# Patient Record
Sex: Female | Born: 1980 | Race: White | Hispanic: No | Marital: Married | Smoking: Former smoker
Health system: Southern US, Community
[De-identification: ages and names within clinical notes are randomized; demographics above are authoritative.]

## PROBLEM LIST (undated history)

## (undated) DIAGNOSIS — Z87440 Personal history of urinary (tract) infections: Secondary | ICD-10-CM

## (undated) DIAGNOSIS — F419 Anxiety disorder, unspecified: Secondary | ICD-10-CM

## (undated) DIAGNOSIS — R51 Headache: Secondary | ICD-10-CM

## (undated) DIAGNOSIS — F329 Major depressive disorder, single episode, unspecified: Secondary | ICD-10-CM

## (undated) DIAGNOSIS — F32A Depression, unspecified: Secondary | ICD-10-CM

## (undated) DIAGNOSIS — K589 Irritable bowel syndrome without diarrhea: Secondary | ICD-10-CM

## (undated) DIAGNOSIS — N200 Calculus of kidney: Secondary | ICD-10-CM

## (undated) HISTORY — PX: SHOULDER SURGERY: SHX246

## (undated) HISTORY — DX: Irritable bowel syndrome, unspecified: K58.9

## (undated) HISTORY — DX: Depression, unspecified: F32.A

## (undated) HISTORY — DX: Major depressive disorder, single episode, unspecified: F32.9

## (undated) HISTORY — DX: Anxiety disorder, unspecified: F41.9

## (undated) HISTORY — PX: NO PAST SURGERIES: SHX2092

---
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
## 2005-02-18 ENCOUNTER — Inpatient Hospital Stay (HOSPITAL_COMMUNITY): Admission: AD | Admit: 2005-02-18 | Discharge: 2005-02-18 | Payer: Self-pay | Admitting: Obstetrics

## 2005-02-26 ENCOUNTER — Ambulatory Visit (HOSPITAL_COMMUNITY): Admission: RE | Admit: 2005-02-26 | Discharge: 2005-02-26 | Payer: Self-pay | Admitting: Obstetrics

## 2005-04-22 ENCOUNTER — Inpatient Hospital Stay (HOSPITAL_COMMUNITY): Admission: AD | Admit: 2005-04-22 | Discharge: 2005-04-22 | Payer: Self-pay | Admitting: Obstetrics

## 2005-06-14 ENCOUNTER — Inpatient Hospital Stay (HOSPITAL_COMMUNITY): Admission: AD | Admit: 2005-06-14 | Discharge: 2005-06-14 | Payer: Self-pay | Admitting: Obstetrics & Gynecology

## 2005-06-29 ENCOUNTER — Inpatient Hospital Stay (HOSPITAL_COMMUNITY): Admission: AD | Admit: 2005-06-29 | Discharge: 2005-07-02 | Payer: Self-pay | Admitting: Obstetrics & Gynecology

## 2007-11-27 ENCOUNTER — Ambulatory Visit: Payer: Self-pay | Admitting: Family Medicine

## 2008-02-09 ENCOUNTER — Ambulatory Visit: Payer: Self-pay | Admitting: Occupational Medicine

## 2008-02-09 DIAGNOSIS — R109 Unspecified abdominal pain: Secondary | ICD-10-CM | POA: Insufficient documentation

## 2008-02-09 DIAGNOSIS — R110 Nausea: Secondary | ICD-10-CM | POA: Insufficient documentation

## 2008-02-16 ENCOUNTER — Encounter: Payer: Self-pay | Admitting: Family

## 2008-02-16 ENCOUNTER — Ambulatory Visit: Payer: Self-pay | Admitting: Family

## 2008-02-17 ENCOUNTER — Encounter: Payer: Self-pay | Admitting: Family

## 2008-02-17 LAB — CONVERTED CEMR LAB
Antibody Screen: NEGATIVE
Basophils Absolute: 0.1 10*3/uL (ref 0.0–0.1)
Basophils Relative: 1 % (ref 0–1)
Eosinophils Absolute: 0.3 10*3/uL (ref 0.0–0.7)
Eosinophils Relative: 3 % (ref 0–5)
HCT: 38.9 % (ref 36.0–46.0)
Hemoglobin: 13.2 g/dL (ref 12.0–15.0)
Hepatitis B Surface Ag: NEGATIVE
Lymphocytes Relative: 24 % (ref 12–46)
Lymphs Abs: 2.4 10*3/uL (ref 0.7–4.0)
MCHC: 33.9 g/dL (ref 30.0–36.0)
MCV: 91.7 fL (ref 78.0–100.0)
Monocytes Absolute: 0.7 10*3/uL (ref 0.1–1.0)
Monocytes Relative: 7 % (ref 3–12)
Neutro Abs: 6.4 10*3/uL (ref 1.7–7.7)
Neutrophils Relative %: 65 % (ref 43–77)
Platelets: 194 10*3/uL (ref 150–400)
RBC: 4.24 M/uL (ref 3.87–5.11)
RDW: 12.9 % (ref 11.5–15.5)
Rh Type: POSITIVE
Rubella: 330.2 intl units/mL — ABNORMAL HIGH
WBC: 9.9 10*3/uL (ref 4.0–10.5)

## 2008-03-19 ENCOUNTER — Ambulatory Visit: Payer: Self-pay | Admitting: Family

## 2008-04-23 ENCOUNTER — Ambulatory Visit: Payer: Self-pay | Admitting: Physician Assistant

## 2008-05-06 ENCOUNTER — Ambulatory Visit (HOSPITAL_COMMUNITY): Admission: RE | Admit: 2008-05-06 | Discharge: 2008-05-06 | Payer: Self-pay | Admitting: Obstetrics & Gynecology

## 2008-05-21 ENCOUNTER — Ambulatory Visit: Payer: Self-pay | Admitting: Family

## 2008-06-04 ENCOUNTER — Ambulatory Visit: Payer: Self-pay | Admitting: Obstetrics and Gynecology

## 2008-07-02 ENCOUNTER — Ambulatory Visit: Payer: Self-pay | Admitting: Family

## 2008-07-02 LAB — CONVERTED CEMR LAB
HCT: 37.2 % (ref 36.0–46.0)
Hemoglobin: 12.1 g/dL (ref 12.0–15.0)
MCHC: 32.5 g/dL (ref 30.0–36.0)
MCV: 95.9 fL (ref 78.0–100.0)
Platelets: 193 10*3/uL (ref 150–400)
RBC: 3.88 M/uL (ref 3.87–5.11)
RDW: 13 % (ref 11.5–15.5)
WBC: 8.6 10*3/uL (ref 4.0–10.5)

## 2008-07-16 ENCOUNTER — Ambulatory Visit: Payer: Self-pay | Admitting: Obstetrics and Gynecology

## 2008-07-19 ENCOUNTER — Encounter: Payer: Self-pay | Admitting: Obstetrics and Gynecology

## 2008-07-19 LAB — CONVERTED CEMR LAB
Collection Interval-CRCL: 24 hr
Creatinine 24 HR UR: 1750 mg/24hr (ref 700–1800)
Creatinine Clearance: 193 mL/min — ABNORMAL HIGH (ref 75–115)
Creatinine, Urine: 109.4 mg/dL
Protein, Ur: 96 mg/24hr (ref 50–100)

## 2008-07-21 ENCOUNTER — Ambulatory Visit: Payer: Self-pay | Admitting: Obstetrics & Gynecology

## 2008-07-26 ENCOUNTER — Ambulatory Visit: Payer: Self-pay | Admitting: Obstetrics and Gynecology

## 2008-08-13 ENCOUNTER — Ambulatory Visit: Payer: Self-pay | Admitting: Family

## 2008-08-20 ENCOUNTER — Ambulatory Visit: Payer: Self-pay | Admitting: Physician Assistant

## 2008-08-21 ENCOUNTER — Encounter: Payer: Self-pay | Admitting: Physician Assistant

## 2008-08-21 LAB — CONVERTED CEMR LAB
Chlamydia, DNA Probe: NEGATIVE
GC Probe Amp, Genital: NEGATIVE

## 2008-08-27 ENCOUNTER — Ambulatory Visit: Payer: Self-pay | Admitting: Family

## 2008-08-28 ENCOUNTER — Ambulatory Visit: Payer: Self-pay | Admitting: Advanced Practice Midwife

## 2008-08-28 ENCOUNTER — Inpatient Hospital Stay (HOSPITAL_COMMUNITY): Admission: AD | Admit: 2008-08-28 | Discharge: 2008-08-30 | Payer: Self-pay | Admitting: Obstetrics & Gynecology

## 2008-10-11 ENCOUNTER — Ambulatory Visit: Payer: Self-pay | Admitting: Physician Assistant

## 2008-10-20 ENCOUNTER — Ambulatory Visit: Payer: Self-pay | Admitting: Obstetrics & Gynecology

## 2009-04-02 ENCOUNTER — Ambulatory Visit: Payer: Self-pay | Admitting: Family Medicine

## 2009-04-02 DIAGNOSIS — J0100 Acute maxillary sinusitis, unspecified: Secondary | ICD-10-CM | POA: Insufficient documentation

## 2009-12-23 ENCOUNTER — Ambulatory Visit: Payer: Self-pay | Admitting: Obstetrics and Gynecology

## 2010-01-03 ENCOUNTER — Ambulatory Visit: Payer: Self-pay | Admitting: Family Medicine

## 2010-01-03 DIAGNOSIS — J020 Streptococcal pharyngitis: Secondary | ICD-10-CM | POA: Insufficient documentation

## 2010-01-03 LAB — CONVERTED CEMR LAB: Rapid Strep: POSITIVE

## 2010-04-11 NOTE — Assessment & Plan Note (Signed)
Summary: HA,sorethroat, Ear pain, Fever x 1 dy rm 4   Vital Signs:  Patient Profile:   30 Years Old Female CC:      Cold & URI symptoms Height:     68 inches Weight:      123 pounds O2 Sat:      100 % O2 treatment:    Room Air Temp:     98.4 degrees F oral Pulse rate:   102 / minute Pulse rhythm:   regular Resp:     16 per minute BP sitting:   133 / 88  (right arm) Cuff size:   regular  Vitals Entered By: Areta Haber CMA (January 03, 2010 7:13 PM)                  Current Allergies: No known allergies History of Present Illness Chief Complaint: Cold & URI symptoms History of Present Illness:  Subjective: Patient complains of sore throat for 1 day. No cough No pleuritic pain No wheezing No nasal congestion No post-nasal drainage No sinus pain/pressure No itchy/red eyes ? earache No hemoptysis No SOB ? fever/chills No nausea No vomiting No abdominal pain No diarrhea + fatigue + myalgias No headache Used OTC meds without relief   Current Problems: PHARYNGITIS, STREPTOCOCCAL (ICD-034.0) ACUTE MAXILLARY SINUSITIS (ICD-461.0) FLANK PAIN, LEFT (ICD-789.09) NAUSEA (ICD-787.02)   Current Meds [facility] IBUPROFEN 200 MG TABS (IBUPROFEN)  IMPLANON 68 MG IMPL (ETONOGESTREL)  THERAFLU COLD & SORE THROAT 20-10-325 MG PACK (PHENIRAMINE-PE-APAP) as directed LOESTRIN 24 FE 1-20 MG-MCG TABS (NORETHIN ACE-ETH ESTRAD-FE) 1 tab by mouth once daily PENICILLIN V POTASSIUM 250 MG/5ML SOLR (PENICILLIN V POTASSIUM) 10cc by mouth q8hr  REVIEW OF SYSTEMS Constitutional Symptoms       Complains of fever.     Denies chills, night sweats, weight loss, weight gain, and fatigue.  Eyes       Denies change in vision, eye pain, eye discharge, glasses, contact lenses, and eye surgery. Ear/Nose/Throat/Mouth       Complains of ear pain and sore throat.      Denies hearing loss/aids, change in hearing, ear discharge, dizziness, frequent runny nose, frequent nose bleeds, sinus  problems, hoarseness, and tooth pain or bleeding.      Comments: Both x 1 dy Respiratory       Denies dry cough, productive cough, wheezing, shortness of breath, asthma, bronchitis, and emphysema/COPD.  Cardiovascular       Denies murmurs, chest pain, and tires easily with exhertion.    Gastrointestinal       Denies stomach pain, nausea/vomiting, diarrhea, constipation, blood in bowel movements, and indigestion. Genitourniary       Denies painful urination, kidney stones, and loss of urinary control. Neurological       Complains of headaches.      Denies paralysis, seizures, and fainting/blackouts. Musculoskeletal       Denies muscle pain, joint pain, joint stiffness, decreased range of motion, redness, swelling, muscle weakness, and gout.  Skin       Denies bruising, unusual mles/lumps or sores, and hair/skin or nail changes.  Psych       Denies mood changes, temper/anger issues, anxiety/stress, speech problems, depression, and sleep problems.  Past History:  Past Medical History: Last updated: 11/27/2007 NO MAJOR MEDICAL ILLNESSES OR DISEASES  Past Surgical History: Last updated: 11/27/2007 NONE  Family History: Last updated: 02/09/2008 Mother age 37 healthy Father age 25 healthy sister age 25 healthy  Social History: Last updated: 02/09/2008 Tobacco use times  12 yrs recently stopped No ETOH use Rec drugs marijuana   Objective:  Appearance:  Patient appears healthy, stated age, and in no acute distress  Eyes:  Pupils are equal, round, and reactive to light and accomdation.  Extraocular movement is intact.  Conjunctivae are not inflamed.  Ears:  Canals normal.  Tympanic membranes normal.   Nose:  Normal septum.  Normal turbinates, mildly congested.  No sinus tenderness present.  Pharynx:  Erythematous Neck:  Supple.   Tender shotty anterior  nodes are palpated bilaterally.  Lungs:  Clear to auscultation.  Breath sounds are equal.  Heart:  Regular rate and rhythm  without murmurs, rubs, or gallops.  Abdomen:  Nontender without masses or hepatosplenomegaly.  Bowel sounds are present.  No CVA or flank tenderness.  Skin:  No rash Rapid strep test positive Assessment New Problems: PHARYNGITIS, STREPTOCOCCAL (ICD-034.0)   Plan New Medications/Changes: PENICILLIN V POTASSIUM 250 MG/5ML SOLR (PENICILLIN V POTASSIUM) 10cc by mouth q8hr  #300cc x 0, 01/03/2010, Donna Christen MD  New Orders: Est. Patient Level III [10272] Rapid Strep [53664] Planning Comments:   Begin penicillin for 10 days.  Ibuprofen 200mg , 4 tabs every 8 hours with food.  Saline gargles. Follow-up with PCP if not improving.   The patient and/or caregiver has been counseled thoroughly with regard to medications prescribed including dosage, schedule, interactions, rationale for use, and possible side effects and they verbalize understanding.  Diagnoses and expected course of recovery discussed and will return if not improved as expected or if the condition worsens. Patient and/or caregiver verbalized understanding.  Prescriptions: PENICILLIN V POTASSIUM 250 MG/5ML SOLR (PENICILLIN V POTASSIUM) 10cc by mouth q8hr  #300cc x 0   Entered and Authorized by:   Donna Christen MD   Signed by:   Donna Christen MD on 01/03/2010   Method used:   Print then Give to Patient   RxID:   [id number]   Orders Added: 1)  Est. Patient Level III [43329] 2)  Rapid Strep [51884]    Laboratory Results  Date/Time Received: January 03, 2010 7:17 PM  Date/Time Reported: January 03, 2010 7:17 PM   Other Tests  Rapid Strep: positive  Kit Test Internal QC: Positive   (Normal Range: Negative)

## 2010-04-11 NOTE — Letter (Signed)
Summary: Handout Printed  Printed Handout:  - Rheumatic Fever 

## 2010-04-11 NOTE — Assessment & Plan Note (Signed)
Summary: Sinus pressure, headache x 2+ wks rm 1   Vital Signs:  Patient Profile:   30 Years Old Female CC:      Cold & URI symptoms Height:     68 inches Weight:      139 pounds O2 Sat:      100 % O2 treatment:    Room Air Temp:     97.8 degrees F oral Pulse rate:   102 / minute Pulse rhythm:   regular Resp:     16 per minute BP sitting:   144 / 95  (right arm) Cuff size:   regular  Vitals Entered By: Areta Haber CMA (April 02, 2009 1:28 PM)                  Current Allergies: No known allergies History of Present Illness Chief Complaint: Cold & URI symptoms History of Present Illness: Subjective:  Patient complains of a long history of seasonal rhinitis and recurring sinusitis.  She developed increased sinus congestion about 2 weeks ago without URI symptoms, and has now developed persistent left facial and retro-orbital pain.  She has had increased left eye lacrimation in the mornings and her upper teeth often hurt.  No fevers, chills, and sweats.  No earache.  Current Problems: ACUTE MAXILLARY SINUSITIS (ICD-461.0) FLANK PAIN, LEFT (ICD-789.09) NAUSEA (ICD-787.02)   Current Meds [facility] IBUPROFEN 200 MG TABS (IBUPROFEN)  IMPLANON 68 MG IMPL (ETONOGESTREL)  CEFDINIR 300 MG CAPS (CEFDINIR) 1 by mouth q12hr  REVIEW OF SYSTEMS Constitutional Symptoms      Denies fever, chills, night sweats, weight loss, weight gain, and fatigue.  Eyes       Denies change in vision, eye pain, eye discharge, glasses, contact lenses, and eye surgery. Ear/Nose/Throat/Mouth       Complains of sinus problems.      Denies hearing loss/aids, change in hearing, ear pain, ear discharge, dizziness, frequent runny nose, frequent nose bleeds, sore throat, hoarseness, and tooth pain or bleeding.      Comments: x 2 + wks Respiratory       Denies dry cough, productive cough, wheezing, shortness of breath, asthma, bronchitis, and emphysema/COPD.  Cardiovascular       Denies murmurs, chest pain,  and tires easily with exhertion.    Gastrointestinal       Denies stomach pain, nausea/vomiting, diarrhea, constipation, blood in bowel movements, and indigestion. Genitourniary       Denies painful urination, kidney stones, and loss of urinary control. Neurological       Complains of headaches.      Denies paralysis, seizures, and fainting/blackouts. Musculoskeletal       Denies muscle pain, joint pain, joint stiffness, decreased range of motion, redness, swelling, muscle weakness, and gout.  Skin       Denies bruising, unusual mles/lumps or sores, and hair/skin or nail changes.  Psych       Denies mood changes, temper/anger issues, anxiety/stress, speech problems, depression, and sleep problems. Other Comments: Pt states she is currently nursing.   Past History:  Past Medical History: Last updated: 11/27/2007 NO MAJOR MEDICAL ILLNESSES OR DISEASES  Past Surgical History: Last updated: 11/27/2007 NONE  Family History: Last updated: 02/09/2008 Mother age 28 healthy Father age 19 healthy sister age 16 healthy  Social History: Last updated: 02/09/2008 Tobacco use times 12 yrs recently stopped No ETOH use Rec drugs marijuana   Objective:  Appearance:  Patient appears healthy, stated age, and in no acute distress  Eyes:  Pupils are equal, round, and reactive to light and accomdation.  Extraocular movement is intact.  Conjunctivae are not inflamed.  Fundi normal Ears:  Canals normal.  Tympanic membranes normal.   Nose:  Edematous turbinates on the left.  There is left maxillary sinus and frontal sinus tenderness Pharynx:  Normal  Neck:  Supple.  No adenopathy is present.  No thyromegaly is present  Lungs:  Clear to auscultation.  Breath sounds are equal.  Sinus films:   negative Assessment New Problems: ACUTE MAXILLARY SINUSITIS (ICD-461.0)  Will treat for a sinusitis.  With her left eye pain recommend that she follow-up with ophthalmologist  Plan New  Medications/Changes: CEFDINIR 300 MG CAPS (CEFDINIR) 1 by mouth q12hr  #28 x 0, 04/02/2009, Donna Christen MD  New Orders: T-DG Sinus 1-2 Views [70210] Est. Patient Level III [16109] Planning Comments:   Begin Omnicef, expectorant/decongestant, saline nasal irrigation. Follow-up with ophthalmologist for the left eye pain   The patient and/or caregiver has been counseled thoroughly with regard to medications prescribed including dosage, schedule, interactions, rationale for use, and possible side effects and they verbalize understanding.  Diagnoses and expected course of recovery discussed and will return if not improved as expected or if the condition worsens. Patient and/or caregiver verbalized understanding.  Prescriptions: CEFDINIR 300 MG CAPS (CEFDINIR) 1 by mouth q12hr  #28 x 0   Entered and Authorized by:   Donna Christen MD   Signed by:   Donna Christen MD on 04/02/2009   Method used:   [facility] to        [facility] [phone][facility] (retail)       [address]       [place], [place]  [address]       Ph: [phone] or [phone]       Fax: [phone]   RxID:   [id number]

## 2010-04-19 ENCOUNTER — Encounter: Payer: Self-pay | Admitting: Obstetrics & Gynecology

## 2010-06-19 LAB — CBC
HCT: 35.2 % — ABNORMAL LOW (ref 36.0–46.0)
Hemoglobin: 12.2 g/dL (ref 12.0–15.0)
MCHC: 34.7 g/dL (ref 30.0–36.0)
MCV: 95.5 fL (ref 78.0–100.0)
Platelets: 216 10*3/uL (ref 150–400)
RBC: 3.69 MIL/uL — ABNORMAL LOW (ref 3.87–5.11)
RDW: 13.3 % (ref 11.5–15.5)
WBC: 16.3 10*3/uL — ABNORMAL HIGH (ref 4.0–10.5)

## 2010-06-19 LAB — RPR: RPR Ser Ql: NONREACTIVE

## 2010-07-25 NOTE — Assessment & Plan Note (Signed)
Casey Sweeney, Casey Sweeney                ACCOUNT NO.:  [id number]   MEDICAL RECORD NO.:  [id number]          PATIENT TYPE:  POB   LOCATION:  [facility] [place]         FACILITY:  [facility]   PHYSICIAN:  Caren Griffins, CNM       DATE OF BIRTH:  Sep 23, 1980   DATE OF SERVICE:  12/23/2009                                  CLINIC NOTE   REASON FOR VISIT:  Vaginal bleeding.   HISTORY:  This is a 30 year old G5, P4, who had her Implanon inserted  here on October 20, 2008.  She did have an episode of heavy bleeding and  at that time, we gave her Loestrin which stopped the bleeding.  After  that, she was amenorrheic and did begin bleeding 2 weeks ago.  She  describes it as heavy bright red bleeding.  No orthostatic symptoms  associated, but aggravated with the bleeding and is interested in  treatment.   PHYSICAL EXAMINATION:  Look at the Implanon would seems fine, it may  have migrated less than a cm.  Vital signs stable.   She actually did have an UPT negative today and we gave her a sample of  Loestrin 28 to begin today and continue daily until she stops bleeding.           ______________________________  Caren Griffins, CNM     DP/MEDQ  D:  12/23/2009  T:  12/24/2009  Job:  [id number]

## 2010-07-25 NOTE — Assessment & Plan Note (Signed)
[name], [name]                ACCOUNT NO.:  [id number]   MEDICAL RECORD NO.:  [id number]          PATIENT TYPE:  POB   LOCATION:  [facility] [place]         FACILITY:  [facility]   PHYSICIAN:  Maylon Cos, CNM    DATE OF BIRTH:  November 21, 1980   DATE OF SERVICE:  10/11/2008                                  CLINIC NOTE   The patient presents today for a 6-week postpartum visit.   HISTORY OF PRESENT ILLNESS:  The patient is 6 weeks postpartum from a  spontaneous vaginal delivery of a female infant that was born on August 29, 2006.  His name is Casey Sweeney and he is present at today's visit.  She  presented to [facility] after premature rupture of  membranes at 37 weeks' gestation.  After several hours of expectant  management, she was given Pitocin to augment labor.  Labor was augmented  and she had a spontaneous vaginal delivery was medicated by epidural  anesthesia over an intact perineum.  No vaginal lacerations.  She states  that her vaginal bleeding postpartum lasted approximately 3-1/2 weeks.  The first day of her menstrual period began on October 08, 2008 and she  describes it as minimal spotting.  She did receive the Depo-Provera shot  prior to discharge.  She is exclusively breast-feeding and she plans to  continue with Implanon for contraceptive.  She denies any postpartum  blues or postpartum depression signs and symptoms.  She has not resumed  intercourse.  However, plans to after clearance of today's visit.   PHYSICAL EXAMINATION:  VITAL SIGNS:  Today, her pulse is 101.  Her blood  pressure is 140/85.  Her weight today is 146.  It is approximately 25  pounds down from time of delivery and her height is 68 inches.  [name]  is a pleasant Caucasian female, who appears to be her stated age of 11.  HEENT:  Grossly normal.  Her face is slightly sunburned and freckled  from too much sun exposure from being out pool.  BREASTS:  Soft and nontender.  ABDOMEN:  Soft,  nontender, minimal diastasis.  PELVIC:  Deferred secondary to no complaints.  EXTREMITIES:  Warm to touch even hair distribution without edema.   ASSESSMENT AND PLAN:  A 6-week postpartum visit, status post spontaneous  vaginal delivery of a female infant, breast-feeding, plan Implanon without  signs of postpartum depression.  The patient instructed to continue  daily multivitamin use may resume intercourse.  The patient should  follow up next week for Implanon placement by either Dr. Penne Lash or Dr.  Marice Potter.  She may resume normal diet and exercise routine.  The patient's  annual exam is due in December.           ______________________________  Maylon Cos, CNM     SS/MEDQ  D:  10/11/2008  T:  10/12/2008  Job:  [id number]

## 2010-10-19 ENCOUNTER — Inpatient Hospital Stay (INDEPENDENT_AMBULATORY_CARE_PROVIDER_SITE_OTHER)
Admission: RE | Admit: 2010-10-19 | Discharge: 2010-10-19 | Disposition: A | Discharge status: Home / Self Care | Payer: Self-pay | Source: Ambulatory Visit | Attending: Family Medicine | Admitting: Family Medicine

## 2010-10-19 ENCOUNTER — Encounter: Payer: Self-pay | Admitting: Family Medicine

## 2010-10-19 DIAGNOSIS — J020 Streptococcal pharyngitis: Secondary | ICD-10-CM

## 2010-10-19 DIAGNOSIS — J019 Acute sinusitis, unspecified: Secondary | ICD-10-CM

## 2010-10-19 DIAGNOSIS — J0100 Acute maxillary sinusitis, unspecified: Secondary | ICD-10-CM

## 2011-02-12 NOTE — Progress Notes (Signed)
Summary: POSS. SINUS INFECTION.....WSE (rm 5)   Vital Signs:  Patient Profile:   30 Years Old Female CC:      sinus pain, neck soreness, ear pain, eye pain x 3 weeks Height:     68 inches Weight:      128 pounds O2 Sat:      98 % O2 treatment:    Room Air Temp:     97.8 degrees F oral Pulse rate:   104 / minute Resp:     14 per minute BP sitting:   106 / 74  (left arm) Cuff size:   regular  Vitals Entered By: Lajean Saver RN (October 19, 2010 10:41 AM)                  Prior Medication List:  [facility] IBUPROFEN 200 MG TABS (IBUPROFEN)  IMPLANON 68 MG IMPL (ETONOGESTREL)  THERAFLU COLD & SORE THROAT 20-10-325 MG PACK (PHENIRAMINE-PE-APAP) as directed LOESTRIN 24 FE 1-20 MG-MCG TABS (NORETHIN ACE-ETH ESTRAD-FE) 1 tab by mouth once daily PENICILLIN V POTASSIUM 250 MG/5ML SOLR (PENICILLIN V POTASSIUM) 10cc by mouth q8hr   Updated Prior Medication List: [facility] IBUPROFEN 200 MG TABS (IBUPROFEN)  * BIRTH CONTROL-   Current Allergies: No known allergies History of Present Illness Chief Complaint: sinus pain, neck soreness, ear pain, eye pain x 3 weeks History of Present Illness: Hx of above symptoms for about 3 weeks . Patient reports daily claritin D w/o relief. She states usually that would clear things up. Her cervical lymph nodes have also gotten worse as well as the pressure around her face.   Current Problems: ACUTE SINUSITIS, UNSPECIFIED (ICD-461.9) PHARYNGITIS, STREPTOCOCCAL (ICD-034.0) ACUTE MAXILLARY SINUSITIS (ICD-461.0) FLANK PAIN, LEFT (ICD-789.09) NAUSEA (ICD-787.02)   Current Meds [facility] IBUPROFEN 200 MG TABS (IBUPROFEN)  * BIRTH CONTROL-  AUGMENTIN 875-125 MG TABS (AMOXICILLIN-POT CLAVULANATE) 1 by mouth 2 times daily FLONASE 50 MCG/ACT SUSP (FLUTICASONE PROPIONATE) 2 puffs each nostril q day  REVIEW OF SYSTEMS Constitutional Symptoms      Denies fever, chills, night sweats, weight loss, weight gain, and fatigue.  Eyes       Complains of eye pain.      Denies  change in vision, eye discharge, glasses, contact lenses, and eye surgery. Ear/Nose/Throat/Mouth       Complains of ear pain and sinus problems.      Denies hearing loss/aids, change in hearing, ear discharge, dizziness, frequent runny nose, frequent nose bleeds, sore throat, hoarseness, and tooth pain or bleeding.  Respiratory       Denies dry cough, productive cough, wheezing, shortness of breath, asthma, bronchitis, and emphysema/COPD.  Cardiovascular       Denies murmurs, chest pain, and tires easily with exhertion.    Gastrointestinal       Denies stomach pain, nausea/vomiting, diarrhea, constipation, blood in bowel movements, and indigestion. Genitourniary       Denies painful urination, kidney stones, and loss of urinary control. Neurological       Denies paralysis, seizures, and fainting/blackouts. Musculoskeletal       Denies muscle pain, joint pain, joint stiffness, decreased range of motion, redness, swelling, muscle weakness, and gout.  Skin       Denies bruising, unusual mles/lumps or sores, and hair/skin or nail changes.  Psych       Denies mood changes, temper/anger issues, anxiety/stress, speech problems, depression, and sleep problems. Other Comments: Taken Claritin D x 3 weeks Advil No fever   Past History:  Family History:  Last updated: 02/09/2008 Mother age 43 healthy Father age 16 healthy sister age 62 healthy  Social History: Last updated: 10/19/2010 No ETOH use Current Smoker- 1/2 PPD Drug use-no 4 children- stay at home mom  Risk Factors: Smoking Status: current (10/19/2010)  Past Medical History: Reviewed history from 11/27/2007 and no changes required. NO MAJOR MEDICAL ILLNESSES OR DISEASES  Past Surgical History: Reviewed history from 11/27/2007 and no changes required. NONE  Family History: Reviewed history from 02/09/2008 and no changes required. Mother age 63 healthy Father age 52 healthy sister age 60 healthy  Social  History: Reviewed history from 02/09/2008 and no changes required. 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 Ears: fluid noted without inflammation bilateral TM Nasal: pale, boggy, swollen nasal turbinates Oral/Pharynx: pharyngeal erythema with exudate, uvula midline without deviation Neck: supple,anterior lymphadenopathy present Skin: no obvious rashes or lesions MSE: oriented to time, place, and person Assessment Additional workup planned New Problems: ACUTE SINUSITIS, UNSPECIFIED (ICD-461.9)   Patient Education: Patient and/or caregiver instructed in the following: rest fluids and Tylenol.  Plan New Medications/Changes: FLONASE 50 MCG/ACT SUSP (FLUTICASONE PROPIONATE) 2 puffs each nostril q day  #1 x 1, 10/19/2010, Hassan Rowan MD AUGMENTIN [phone] MG TABS (AMOXICILLIN-POT CLAVULANATE) 1 by mouth 2 times daily  #20 x 0, 10/19/2010, Hassan Rowan MD  New Orders: Est. Patient Level III [04540] Follow Up: Follow up in 2-3 days if no improvement, Follow up on an as needed basis, Follow up with Primary Physician  The patient and/or caregiver has been counseled thoroughly with regard to medications prescribed including dosage, schedule, interactions, rationale for use, and possible side effects and they verbalize understanding.  Diagnoses and expected course of recovery discussed and will return if not improved as expected or if the condition worsens. Patient and/or caregiver verbalized understanding.  Prescriptions: FLONASE 50 MCG/ACT SUSP (FLUTICASONE PROPIONATE) 2 puffs each nostril q day  #1 x 1   Entered and Authorized by:   Hassan Rowan MD   Signed by:   Hassan Rowan MD on 10/19/2010   Method used:   Printed then  faxed to ...       [address] [phone]* (retail)       [address] [place], [place]  [address]       Ph: [phone]       Fax: [phone]   RxID:   [id number] AUGMENTIN 875-125 MG TABS (AMOXICILLIN-POT CLAVULANATE) 1 by mouth 2 times daily  #20 x 0   Entered and Authorized by:   Hassan Rowan MD   Signed by:   Hassan Rowan MD on 10/19/2010   Method used:   Printed then faxed to ...       [address] [phone]* (retail)       [address] [place], [place]  [address]       Ph: [phone]       Fax: [phone]   RxID:   [id number]   Patient Instructions: 1)  Please schedule a follow-up appointment as needed. 2)  Please schedule an appointment with your primary doctor in :7-10 days if not improving 3)  Take your antibiotic as prescribed until ALL of it is  gone, but stop if you develop a rash or swelling and contact our office as soon as possible. 4)  Acute sinusitis symptoms for less than 10 days are not helped by antibiotics.Use warm moist compresses, and over the counter decongestants ( only as directed). Call if no improvement in 5-7 days, sooner if increasing pain, fever, or new symptoms. 5)  Tobacco is very bad for your health and your loved ones! You Should stop smoking!. 6)  Stop Smoking Tips: Choose a Quit date. Cut down before the Quit date. decide what you will do as a substitute when you feel the urge to smoke(gum,toothpick,exercise). 7)  Continue the claritin -D from home.  Orders Added: 1)  Est. Patient Level III [16109]

## 2011-07-27 ENCOUNTER — Encounter: Payer: Self-pay | Admitting: Emergency Medicine

## 2011-07-27 ENCOUNTER — Telehealth: Payer: Self-pay | Admitting: *Deleted

## 2011-07-27 ENCOUNTER — Emergency Department
Admission: EM | Admit: 2011-07-27 | Discharge: 2011-07-27 | Disposition: A | Discharge status: Home / Self Care | Payer: Self-pay | Source: Home / Self Care | Attending: Emergency Medicine | Admitting: Emergency Medicine

## 2011-07-27 DIAGNOSIS — R109 Unspecified abdominal pain: Secondary | ICD-10-CM

## 2011-07-27 DIAGNOSIS — F329 Major depressive disorder, single episode, unspecified: Secondary | ICD-10-CM

## 2011-07-27 DIAGNOSIS — N390 Urinary tract infection, site not specified: Secondary | ICD-10-CM

## 2011-07-27 DIAGNOSIS — M545 Low back pain, unspecified: Secondary | ICD-10-CM

## 2011-07-27 DIAGNOSIS — F32A Depression, unspecified: Secondary | ICD-10-CM

## 2011-07-27 DIAGNOSIS — F3289 Other specified depressive episodes: Secondary | ICD-10-CM

## 2011-07-27 LAB — POCT URINALYSIS DIP (MANUAL ENTRY)
Bilirubin, UA: NEGATIVE
Glucose, UA: NEGATIVE
Ketones, POC UA: NEGATIVE
Nitrite, UA: POSITIVE
Protein Ur, POC: >=300
Spec Grav, UA: 1.02 (ref 1.005–1.03)
Urobilinogen, UA: 0.2 (ref 0–1)
pH, UA: 6 (ref 5–8)

## 2011-07-27 LAB — POCT URINE PREGNANCY: Preg Test, Ur: NEGATIVE

## 2011-07-27 MED ORDER — CIPROFLOXACIN HCL 500 MG PO TABS: 500.0000 mg | ORAL_TABLET | Freq: Two times a day (BID) | ORAL | Status: AC

## 2011-07-27 MED ORDER — CITALOPRAM HYDROBROMIDE 10 MG PO TABS: 10.0000 mg | ORAL_TABLET | Freq: Every day | ORAL | Status: DC

## 2011-07-27 NOTE — ED Notes (Signed)
Low back pain, nausea x 5 days

## 2011-07-27 NOTE — ED Provider Notes (Signed)
History     CSN: [id number]  Arrival date & time 07/27/11  1027   First MD Initiated Contact with Patient 07/27/11 [phone]      Chief Complaint  Patient presents with  . Back Pain    (Consider location/radiation/quality/duration/timing/severity/associated sxs/prior treatment) HPI 1) the patient presents today with right flank pain, back pain, and lower abdominal pain.  No family history of kidney stones however she admits to drinking a lot of sodas.  No hematuria, dysuria, polyuria.  She states the pain is very sharp and comes and goes in waves.  She has never had abdominal surgery before.  Her last period was approximately 28 days ago.  No fever, chills, nausea, vomiting.  She is eating normally.  2) the patient also asks if we can give her a prescription for antidepressants.  She has been on Wellbutrin in the past which helped a little bit but she stopped it on her own.  She states that she has a lot of anxiety secondary to a lack of a job and 4 children.  She does not have a PCP and cannot afford a doctor or a Veterinary surgeon.  She states that she does not have any friends and is nobody else to talk to about this.  She denies suicidal or homicidal ideation.  History reviewed. No pertinent past medical history.  History reviewed. No pertinent past surgical history.  No family history on file.  History  Substance Use Topics  . Smoking status: Current Everyday Smoker -- 0.5 packs/day for 10 years  . Smokeless tobacco: Not on file  . Alcohol Use: No    OB History    Grav Para Term Preterm Abortions TAB SAB Ect Mult Living                  Review of Systems  All other systems reviewed and are negative.    Allergies  Review of patient's allergies indicates no known allergies.  Home Medications   Current Outpatient Rx  Name Route Sig Dispense Refill  . LEVONORGESTREL-ETHINYL ESTRAD 0.1-20 MG-MCG PO TABS Oral Take 1 tablet by mouth daily.    Marland Kitchen CIPROFLOXACIN HCL 500 MG PO TABS  Oral Take 1 tablet (500 mg total) by mouth 2 (two) times daily. 14 tablet 0  . CITALOPRAM HYDROBROMIDE 10 MG PO TABS Oral Take 1 tablet (10 mg total) by mouth daily. 60 tablet 0    BP 112/80  Pulse 99  Temp(Src) 97.9 F (36.6 C) (Oral)  Resp 20  Ht 5\' 8"  (1.727 m)  Wt 133 lb (60.328 kg)  BMI 20.22 kg/m2  SpO2 98%  LMP 06/28/2011  Physical Exam  Nursing note and vitals reviewed. Constitutional: She is oriented to person, place, and time. She appears well-developed and well-nourished.  Non-toxic appearance. She does not have a sickly appearance. She appears distressed (Uncomfortable sitting still in her chair).  HENT:  Head: Normocephalic and atraumatic.  Eyes: No scleral icterus.  Neck: Neck supple.  Cardiovascular: Regular rhythm and normal heart sounds.   Pulmonary/Chest: Effort normal and breath sounds normal. No respiratory distress.  Abdominal: Soft. Normal appearance. There is no tenderness. There is CVA tenderness (Right-sided). There is no rigidity, no guarding, no tenderness at McBurney's point and negative Murphy's sign.  Neurological: She is alert and oriented to person, place, and time.  Skin: Skin is warm and dry.  Psychiatric: Her speech is normal and behavior is normal. Judgment and thought content normal. Cognition and memory are normal. She exhibits  a depressed mood (Intermittently tearful).    ED Course  Procedures (including critical care time)   Labs Reviewed  POCT URINALYSIS DIP (MANUAL ENTRY)  POCT URINE PREGNANCY   No results found.   1. Depression   2. Pain in lower back     Results for orders placed during the hospital encounter of 07/27/11  POCT URINALYSIS DIP (MANUAL ENTRY)      Component Value Range   Color, UA yellow     Clarity, UA cloudy     Glucose, UA neg     Bilirubin, UA negative     Bilirubin, UA negative     Spec Grav, UA 1.020  1.005 - 1.03    Blood, UA large     pH, UA 6.0  5 - 8    Protein Ur, POC >=300     Urobilinogen,  UA 0.2  0 - 1    Nitrite, UA Positive     Leukocytes, UA large (3+)    POCT URINE PREGNANCY      Component Value Range   Preg Test, Ur Negative       MDM   A pregnancy test was done today in clinic which is negative.  A urinalysis was also done in clinic which shows positive nitrite and leukocytes with large amount of blood.  I believe that she does have urinary tract infection and gave her Cipro for this.  However I'm concerned that she may be having this infection secondary to a kidney stone.  We discussed her options and due to the fact that she does not have health insurance, she would like to hold off on the CAT scan for now.  Instead we gave her a strainer for urine and I encouraged her to increase hydration with water to try to flush out any stones that may be there.  However if the pain does continue, then a CT would likely be appropriate to make sure that this is not nephrolithiasis versus pyelonephritis versus appendicitis versus ovarian cyst.  The patient understands and agrees to this course of action.  I gave her the option of going directly to the emergency room however she would like to hold off on that for now.  ER precautions given for increased RLQ or flank pain, fever, dizziness, bleeding, etc.  We also discussed her depression and anxiety that she's been having.  She states that she has been trying her best to do this on her own, but stress at home including with her job and with her 4 children is sometimes too difficult to deal with.  She strongly denies any suicidal or homicidal ideations. Usually we do not give antidepressants in our clinic since we do not have the ability to followup, however she says that she does not know where to go.  I gave her 60 day supply of Celexa and told her that she must followup with psych or a PCP and we gave her information on that.  On the way out of our office, we also had her fill out paperwork for financial assistance to make sure that she can  qualify if needing more help down the road.  ER precautions given for SI/HI.  Marlaine Hind, MD 07/27/11 1011

## 2011-09-02 ENCOUNTER — Emergency Department
Admission: EM | Admit: 2011-09-02 | Discharge: 2011-09-02 | Disposition: A | Discharge status: Home / Self Care | Payer: BC Managed Care – PPO | Source: Home / Self Care | Attending: Family Medicine | Admitting: Family Medicine

## 2011-09-02 DIAGNOSIS — J209 Acute bronchitis, unspecified: Secondary | ICD-10-CM

## 2011-09-02 DIAGNOSIS — J0100 Acute maxillary sinusitis, unspecified: Secondary | ICD-10-CM

## 2011-09-02 MED ORDER — BENZONATATE 200 MG PO CAPS: ORAL_CAPSULE | ORAL | Status: DC

## 2011-09-02 MED ORDER — SULFAMETHOXAZOLE-TRIMETHOPRIM 800-160 MG PO TABS: 1.0000 | ORAL_TABLET | Freq: Two times a day (BID) | ORAL | Status: AC

## 2011-09-02 NOTE — ED Notes (Addendum)
Casey Sweeney complains of pressure in both ears and hoarseness for 5 days, which is worsening. She also complains of a cough. Denies fever, chills or sweats. She has redness in her right ear which she states she is taking medication for pink eye left over from when on of her kids had pink eye.

## 2011-09-02 NOTE — ED Provider Notes (Signed)
History     CSN: [id number]  Arrival date & time 09/02/11  1104   First MD Initiated Contact with Patient 09/02/11 1107      Chief Complaint  Patient presents with  . Otalgia    pressure in both ears  . Hoarse     HPI Comments: Patient complains of approximately 5 day history of gradually progressive URI symptoms beginning with a mild sore throat (now improved), followed by progressive nasal congestion.  A cough started about 3 days ago.  Complains of fatigue and initial myalgias.  Cough is now worse at night and generally non-productive during the day.  There has been no pleuritic pain but she does feel tightness in her anterior chest.  She occasionally feels short of breath and wheezes.  She now often coughs until she gags.  She does not remember her last tetanus immunization.  The history is provided by the patient.    History reviewed. No pertinent past medical history.  History reviewed. No pertinent past surgical history.  Family History  Problem Relation Age of Onset  . Cancer Other 80    unknown cancer    History  Substance Use Topics  . Smoking status: Current Everyday Smoker -- 0.5 packs/day for 10 years  . Smokeless tobacco: Never Used  . Alcohol Use: No    OB History    Grav Para Term Preterm Abortions TAB SAB Ect Mult Living                  Review of Systems + sore throat + cough No pleuritic pain ? wheezing + nasal congestion + post-nasal drainage + sinus pain/pressure No itchy/red eyes ? earache No hemoptysis Occasional SOB No fever, + chills No nausea No vomiting No abdominal pain No diarrhea No urinary symptoms No skin rashes + fatigue + myalgias + headache Used OTC meds without relief  Allergies  Review of patient's allergies indicates no known allergies.  Home Medications   Current Outpatient Rx  Name Route Sig Dispense Refill  . LEVONORGESTREL-ETHINYL ESTRAD 0.1-20 MG-MCG PO TABS Oral Take 1 tablet by mouth daily.    Marland Kitchen  BENZONATATE 200 MG PO CAPS  Take one tab by mouth once or twice daily as needed for cough 20 capsule 0  . CITALOPRAM HYDROBROMIDE 10 MG PO TABS Oral Take 1 tablet (10 mg total) by mouth daily. 60 tablet 0  . SULFAMETHOXAZOLE-TRIMETHOPRIM 800-160 MG PO TABS Oral Take 1 tablet by mouth 2 (two) times daily. 14 tablet 0    BP 113/81  Pulse 117  Temp 98.2 F (36.8 C) (Oral)  Resp 18  Ht 5\' 8"  (1.727 m)  Wt 132 lb (59.875 kg)  BMI 20.07 kg/m2  SpO2 96%  Physical Exam Nursing notes and Vital Signs reviewed. Appearance:  Patient appears healthy, stated age, and in no acute distress Eyes:  Pupils are equal, round, and reactive to light and accomodation.  Extraocular movement is intact.  Conjunctivae are not inflamed  Ears:  Canals normal.  Tympanic membranes normal.  Nose:  Mildly congested turbinates, worse on the left.  Left maxillary sinus tenderness is present.  Pharynx:  Normal Neck:  Supple.   Tender shotty posterior nodes are palpated bilaterally  Lungs:   Few faint bibasilar wheezes posteriorly.  Breath sounds are equal.  Chest:  Distinct tenderness to palpation over the mid-sternum.  Heart:  Regular rate and rhythm without murmurs, rubs, or gallops.  Abdomen:  Nontender without masses or hepatosplenomegaly.  Bowel sounds  are present.  No CVA or flank tenderness.  Extremities:  No edema.  No calf tenderness Skin:  No rash present.   ED Course  Procedures none      1. Acute bronchitis;  ? Pertussis  2. Acute maxillary sinusitis       MDM  Begin Septra DS.  Tessalon for cough. Take Mucinex D (guaifenesin with decongestant) twice daily for congestion.  Increase fluid intake, rest. May use Afrin nasal spray (or generic oxymetazoline) twice daily for about 5 days.  Also recommend using saline nasal spray several times daily and saline nasal irrigation (AYR is a common brand) Stop all antihistamines for now, and other non-prescription cough/cold preparations. May take  Ibuprofen 200mg , 4 tabs every 8 hours with food for chest/sternum discomfort. Recommend a Tdap when well.  Follow-up with family doctor if not improving 7 to 10 days.         Lattie Haw, MD 09/02/11 1152

## 2011-09-02 NOTE — Discharge Instructions (Signed)
Take Mucinex D (guaifenesin with decongestant) twice daily for congestion.  Increase fluid intake, rest. May use Afrin nasal spray (or generic oxymetazoline) twice daily for about 5 days.  Also recommend using saline nasal spray several times daily and saline nasal irrigation (AYR is a common brand) Stop all antihistamines for now, and other non-prescription cough/cold preparations. May take Ibuprofen 200mg , 4 tabs every 8 hours with food for chest/sternum discomfort. Recommend a Tdap when well.  Follow-up with family doctor if not improving 7 to 10 days.

## 2011-09-06 ENCOUNTER — Telehealth: Payer: Self-pay | Admitting: *Deleted

## 2011-12-05 ENCOUNTER — Ambulatory Visit: Payer: BC Managed Care – PPO | Admitting: Obstetrics & Gynecology

## 2011-12-19 ENCOUNTER — Ambulatory Visit (INDEPENDENT_AMBULATORY_CARE_PROVIDER_SITE_OTHER): Payer: BC Managed Care – PPO | Admitting: Obstetrics & Gynecology

## 2011-12-19 ENCOUNTER — Encounter: Payer: Self-pay | Admitting: Obstetrics & Gynecology

## 2011-12-19 VITALS — BP 125/83 | HR 109 | Temp 98.2°F | Resp 16 | Ht 68.0 in | Wt 136.0 lb

## 2011-12-19 DIAGNOSIS — Z01419 Encounter for gynecological examination (general) (routine) without abnormal findings: Secondary | ICD-10-CM

## 2011-12-19 DIAGNOSIS — Z1151 Encounter for screening for human papillomavirus (HPV): Secondary | ICD-10-CM

## 2011-12-19 DIAGNOSIS — Z124 Encounter for screening for malignant neoplasm of cervix: Secondary | ICD-10-CM

## 2011-12-19 NOTE — Addendum Note (Signed)
Addended by: Granville Lewis on: 12/19/2011 11:48 AM   Modules accepted: Orders

## 2011-12-19 NOTE — Progress Notes (Signed)
  Subjective:     Casey Sweeney is a 31 y.o. female here for a routine exam.  Current complaints: wants new birth control, considering Mirena; decreased libido but thinks she doesn't want to have sex because it is not for procreation.  She has not told her husband she wants more children.  They can't afford them right now.  .  Personal health questionnaire reviewed: yes.   Gynecologic History Patient's last menstrual period was 12/05/2011. Contraception: OCP (estrogen/progesterone) Last Pap: 2012. Results were: ASCUS Last mammogram: n/a. Results were: n/a  Obstetric History OB History    Grav Para Term Preterm Abortions TAB SAB Ect Mult Living   5 4 3 1      4      # Outc Date GA Lbr Len/2nd Wgt Sex Del Anes PTL Lv   1 TRM            2 TRM            3 TRM            4 PRE            5 GRA                The following portions of the patient's history were reviewed and updated as appropriate: allergies, current medications, past family history, past medical history, past social history, past surgical history and problem list.  Review of Systems Pertinent items are noted in HPI.    Objective:   Filed Vitals:   12/19/11 1054  BP: 125/83  Pulse: 109  Temp: 98.2 F (36.8 C)  TempSrc: Oral  Resp: 16  Height: 5\' 8"  (1.727 m)  Weight: 136 lb (61.689 kg)     Vitals:  WNL General appearance: alert, cooperative and no distress Head: Normocephalic, without obvious abnormality, atraumatic Eyes: negative Throat: lips, mucosa, and tongue normal; teeth and gums normal Lungs: clear to auscultation bilaterally Breasts: normal appearance, no masses or tenderness, No nipple retraction or dimpling, No nipple discharge or bleeding Heart: regular rate and rhythm Abdomen: soft, non-tender; bowel sounds normal; no masses,  no organomegaly Pelvic: cervix normal in appearance, external genitalia normal, no adnexal masses or tenderness, no bladder tenderness, no cervical motion tenderness,  perianal skin: no external genital warts noted, rectovaginal septum normal, urethra without abnormality or discharge, uterus normal size, shape, and consistency and vagina normal without discharge Extremities: no edema, redness or tenderness in the calves or thighs Skin: no lesions or rash Lymph nodes: Axillary adenopathy: none       Assessment:    Healthy female exam.    Plan:    Education reviewed: self breast exams. Contraception: OCP (estrogen/progesterone). Follow up in: 7 days. Mirena IUD Pt to speak with husband about desires for future fertility.

## 2011-12-21 ENCOUNTER — Ambulatory Visit: Payer: BC Managed Care – PPO | Admitting: Physician Assistant

## 2012-01-09 ENCOUNTER — Telehealth: Payer: Self-pay | Admitting: *Deleted

## 2012-01-09 NOTE — Telephone Encounter (Signed)
Pt called wanting to come in for Mirena IUD.  The schedules were not convenient for her schedule this month.  She is requesting a pkg of OCP's for this month to get her through until she can get the IUD placed.  Spoke with Dr Marice Potter and pt was given a sample pkg of Lo Loestrin.

## 2012-02-04 ENCOUNTER — Ambulatory Visit (INDEPENDENT_AMBULATORY_CARE_PROVIDER_SITE_OTHER): Payer: BC Managed Care – PPO | Admitting: Advanced Practice Midwife

## 2012-02-04 ENCOUNTER — Encounter: Payer: Self-pay | Admitting: Advanced Practice Midwife

## 2012-02-04 VITALS — BP 127/80 | HR 96 | Resp 16 | Ht 66.0 in | Wt 140.0 lb

## 2012-02-04 DIAGNOSIS — Z30430 Encounter for insertion of intrauterine contraceptive device: Secondary | ICD-10-CM | POA: Insufficient documentation

## 2012-02-04 DIAGNOSIS — Z01812 Encounter for preprocedural laboratory examination: Secondary | ICD-10-CM

## 2012-02-04 LAB — POCT URINE PREGNANCY: Preg Test, Ur: NEGATIVE

## 2012-02-04 MED ORDER — LEVONORGESTREL 20 MCG/24HR IU IUD
INTRAUTERINE_SYSTEM | Freq: Once | INTRAUTERINE | Status: AC
  Administered 2012-02-04: 11:00:00 via INTRAUTERINE

## 2012-02-04 NOTE — Progress Notes (Signed)
Subjective:     Patient ID: Casey Sweeney, female   DOB: May 19, 1980, 30 y.o.   MRN: [id number]  HPI 31 y.o. presents to office for Mirena placement.  She is currently having her period, has been taking OCPs until menses started, and denies any problems today.  Pt unsure if desires permanent contraception, will discuss more with her husband and decide in next few years.     Review of Systems Pertinent ROS in HPI    Objective:   Physical Exam BP 127/80  Pulse 96  Resp 16  Ht 5\' 6"  (1.676 m)  Wt 140 lb (63.504 kg)  BMI 22.60 kg/m2  LMP 02/01/2012  Pelvic exam: Cervix pink, visually closed, without lesion, small amount dark red blood from os, vaginal walls and external genitalia normal Bimanual exam: Cervix 0/long/high, firm, anterior, neg CMT, uterus nontender, anteflexed, nonenlarged, adnexa without tenderness, enlargement, or mass  IUD Procedure Note Patient identified, informed consent performed.  Discussed risks of irregular bleeding, cramping, infection, malpositioning or misplacement of the IUD outside the uterus which may require further procedures. Time out was performed.  Urine pregnancy test negative.  Speculum placed in the vagina.  Cervix visualized.  Cleaned with Betadine x 2.  Grasped anteriorly with a single tooth tenaculum.  Uterus sounded to 6 cm.  Mirena IUD placed per manufacturer's recommendations.  Strings trimmed to 3 cm. Tenaculum was removed, good hemostasis noted.  Patient tolerated procedure well.   Patient was given post-procedure instructions and the Mirena care card with expiration date.  Patient was also asked to check IUD strings periodically and follow up in 4-6 weeks for IUD check.    Assessment:     1. Encounter for IUD insertion       Plan:     1.  Reviewed risks/benefits of Mirena prior to placement.  Pt unsure if desires fertility in the future so Mirena good option to delay decision. 2.  F/U in 4-6 weeks for IUD check 3.  Pap in ~ 1 year. Pt  had abnormal Pap at [facility], then came to [place] office and had normal Pap this year.  No other hx abnormal Paps.  F/U in one year, then follow [facility]CCP guidelines after next Pap.

## 2012-03-17 ENCOUNTER — Ambulatory Visit: Payer: BC Managed Care – PPO | Admitting: Advanced Practice Midwife

## 2012-03-26 ENCOUNTER — Ambulatory Visit (INDEPENDENT_AMBULATORY_CARE_PROVIDER_SITE_OTHER): Payer: BC Managed Care – PPO | Admitting: Physician Assistant

## 2012-03-26 ENCOUNTER — Encounter: Payer: Self-pay | Admitting: Physician Assistant

## 2012-03-26 ENCOUNTER — Ambulatory Visit (INDEPENDENT_AMBULATORY_CARE_PROVIDER_SITE_OTHER): Payer: BC Managed Care – PPO | Admitting: Obstetrics & Gynecology

## 2012-03-26 ENCOUNTER — Encounter: Payer: Self-pay | Admitting: Obstetrics & Gynecology

## 2012-03-26 VITALS — BP 121/80 | HR 97 | Resp 16 | Ht 66.0 in | Wt 138.0 lb

## 2012-03-26 VITALS — BP 134/82 | HR 97 | Ht 67.5 in | Wt 138.0 lb

## 2012-03-26 DIAGNOSIS — Z0000 Encounter for general adult medical examination without abnormal findings: Secondary | ICD-10-CM

## 2012-03-26 DIAGNOSIS — F32A Depression, unspecified: Secondary | ICD-10-CM

## 2012-03-26 DIAGNOSIS — Z30431 Encounter for routine checking of intrauterine contraceptive device: Secondary | ICD-10-CM

## 2012-03-26 DIAGNOSIS — F3289 Other specified depressive episodes: Secondary | ICD-10-CM

## 2012-03-26 DIAGNOSIS — F411 Generalized anxiety disorder: Secondary | ICD-10-CM

## 2012-03-26 DIAGNOSIS — Z13220 Encounter for screening for lipoid disorders: Secondary | ICD-10-CM

## 2012-03-26 DIAGNOSIS — F329 Major depressive disorder, single episode, unspecified: Secondary | ICD-10-CM | POA: Insufficient documentation

## 2012-03-26 DIAGNOSIS — F419 Anxiety disorder, unspecified: Secondary | ICD-10-CM | POA: Insufficient documentation

## 2012-03-26 DIAGNOSIS — J329 Chronic sinusitis, unspecified: Secondary | ICD-10-CM

## 2012-03-26 DIAGNOSIS — Z131 Encounter for screening for diabetes mellitus: Secondary | ICD-10-CM

## 2012-03-26 MED ORDER — SERTRALINE HCL 50 MG PO TABS: ORAL_TABLET | ORAL | Status: DC

## 2012-03-26 MED ORDER — AMOXICILLIN-POT CLAVULANATE 875-125 MG PO TABS: 1.0000 | ORAL_TABLET | Freq: Two times a day (BID) | ORAL | Status: DC

## 2012-03-26 NOTE — Progress Notes (Signed)
Subjective:    Patient ID: Casey Sweeney, female    DOB: Aug 10, 1980, 32 y.o.   MRN: [id number]  HPI    Review of Systems     Objective:   Physical Exam        Assessment & Plan:   Subjective:     Casey Sweeney is a 32 y.o. female and is here for a comprehensive physical exam. The patient reports problems - sinus pressure and anxiety/depression.   Pt has had sinus pressure for over 2 weeks. Her teeth ache and both of her ears hurt. She has tried OTC decongestants, mucinex, nasal rinses. They seemed to help for a while but now pressure seems to be getting worse. Denies fever, chills, muscle aches, cough, ST, wheezing. She thought it would get better by now.   Pt also feels like she would like to try something for anxiety and depression. She is a mother of 4 and things are overwhelming. She has been on medication in the past and it did help. She denies suicidal thoughts. She finds herself worry over little things. She denies any panic attacks. She thinks if she gets anxiety and depression under control maybe she can stop smoking.    Continues to smoke but not ready to quit.  History   Social History  . Marital Status: Married    Spouse Name: N/A    Number of Children: N/A  . Years of Education: N/A   Occupational History  . unemployed    Social History Main Topics  . Smoking status: Current Every Day Smoker -- 0.5 packs/day for 10 years    Types: Cigarettes  . Smokeless tobacco: Never Used  . Alcohol Use: Not on file  . Drug Use: Not on file  . Sexually Active: Yes -- Female partner(s)   Other Topics Concern  . Not on file   Social History Narrative  . No narrative on file   Health Maintenance  Topic Date Due  . Pap Smear  12/19/2014  . Tetanus/tdap  03/27/2019  . Influenza Vaccine  11/11/2011    The following portions of the patient's history were reviewed and updated as appropriate: allergies, current medications, past family history, past medical history,  past social history, past surgical history and problem list.  Review of Systems A comprehensive review of systems was negative.   Objective:    BP 134/82  Pulse 97  Ht 5' 7.5" (1.715 m)  Wt 138 lb (62.596 kg)  BMI 21.29 kg/m2 General appearance: alert, cooperative and appears stated age Head: Normocephalic, without obvious abnormality, atraumatic Eyes: conjunctivae/corneas clear. PERRL, EOM's intact. Fundi benign. Ears: normal TM's and external ear canals both ears Nose: Nares normal. Septum midline. Mucosa normal. No drainage or sinus tenderness., sinus tenderness bilateral Throat: lips, mucosa, and tongue normal; teeth and gums normal Neck: no adenopathy, no carotid bruit, no JVD, supple, symmetrical, trachea midline and thyroid not enlarged, symmetric, no tenderness/mass/nodules Back: symmetric, no curvature. ROM normal. No CVA tenderness. Lungs: clear to auscultation bilaterally Heart: regular rate and rhythm, S1, S2 normal, no murmur, click, rub or gallop Abdomen: soft, non-tender; bowel sounds normal; no masses,  no organomegaly Extremities: extremities normal, atraumatic, no cyanosis or edema Pulses: 2+ and symmetric Skin: Skin color, texture, turgor normal. No rashes or lesions Lymph nodes: Cervical, supraclavicular, and axillary nodes normal. Neurologic: Grossly normal    Assessment:    Healthy female exam.      Plan:    CPE/anxiety/depression/sinusitis- Treated with augmentin for  sinusitis. Started on Zoloft. Follow up in 6 weeks. Discussed side effects of worsening depression to stop and call office. May have some nausea but give 2 weeks. All vaccines up to date. Pap is with OB/GYN. Encouraged patient to start a MVI and calcium. Discussed quiting smoking but she is not ready. Encouraged regular exercise. Will call with labs after fasting obtain.   Declined flu shot. See After Visit Summary for Counseling Recommendations

## 2012-03-26 NOTE — Patient Instructions (Addendum)
Start Zoloft and follow up in 2 months.   Start Augmentin and I would consider going on claritin/zyrtec daily after finishing abx.   Diet and Irritable Bowel Syndrome  No cure has been found for irritable bowel syndrome (IBS). Many options are available to treat the symptoms. Your caregiver will give you the best treatments available for your symptoms. He or she will also encourage you to manage stress and to make changes to your diet. You need to work with your caregiver and Registered Dietician to find the best combination of medicine, diet, counseling, and support to control your symptoms. The following are some diet suggestions. FOODS THAT MAKE IBS WORSE  Fatty foods, such as [place] fries.  Milk products, such as cheese or ice cream.  Chocolate.  Alcohol.  Caffeine (found in coffee and some sodas).  Carbonated drinks, such as soda. If certain foods cause symptoms, you should eat less of them or stop eating them. FOOD JOURNAL   Keep a journal of the foods that seem to cause distress. Write down:  What you are eating during the day and when.  What problems you are having after eating.  When the symptoms occur in relation to your meals.  What foods always make you feel badly.  Take your notes with you to your caregiver to see if you should stop eating certain foods. FOODS THAT MAKE IBS BETTER Fiber reduces IBS symptoms, especially constipation, because it makes stools soft, bulky, and easier to pass. Fiber is found in bran, bread, cereal, beans, fruit, and vegetables. Examples of foods with fiber include:  Apples.  Peaches.  Pears.  Berries.  Figs.  Broccoli, raw.  Cabbage.  Carrots.  Raw peas.  Kidney beans.  Lima beans.  Whole-grain bread.  Whole-grain cereal. Add foods with fiber to your diet a little at a time. This will let your body get used to them. Too much fiber at once might cause gas and swelling of your abdomen. This can trigger symptoms in a  person with IBS. Caregivers usually recommend a diet with enough fiber to produce soft, painless bowel movements. High fiber diets may cause gas and bloating. However, these symptoms often go away within a few weeks, as your body adjusts. In many cases, dietary fiber may lessen IBS symptoms, particularly constipation. However, it may not help pain or diarrhea. High fiber diets keep the colon mildly enlarged (distended) with the added fiber. This may help prevent spasms in the colon. Some forms of fiber also keep water in the stool, thereby preventing hard stools that are difficult to pass.  Besides telling you to eat more foods with fiber, your caregiver may also tell you to get more fiber by taking a fiber pill or drinking water mixed with a special high fiber powder. An example of this is a natural fiber laxative containing psyllium seed.  TIPS  Large meals can cause cramping and diarrhea in people with IBS. If this happens to you, try eating 4 or 5 small meals a day, or try eating less at each of your usual 3 meals. It may also help if your meals are low in fat and high in carbohydrates. Examples of carbohydrates are pasta, rice, whole-grain breads and cereals, fruits, and vegetables.  If dairy products cause your symptoms to flare up, you can try eating less of those foods. You might be able to handle yogurt better than other dairy products, because it contains bacteria that helps with digestion. Dairy products are an important  source of calcium and other nutrients. If you need to avoid dairy products, be sure to talk with a Registered Dietitian about getting these nutrients through other food sources.  Drink enough water and fluids to keep your urine clear or pale yellow. This is important, especially if you have diarrhea. FOR MORE INFORMATION  International Foundation for Functional Gastrointestinal Disorders: www.iffgd.org  National Digestive Diseases Information Clearinghouse:  digestive.StageSync.si Document Released: 05/19/2003 Document Revised: 05/21/2011 Document Reviewed: 02/03/2007 [facility] Patient Information 2013 [place], [place].    Sinusitis Sinusitis is redness, soreness, and swelling (inflammation) of the paranasal sinuses. Paranasal sinuses are air pockets within the bones of your face (beneath the eyes, the middle of the forehead, or above the eyes). In healthy paranasal sinuses, mucus is able to drain out, and air is able to circulate through them by way of your nose. However, when your paranasal sinuses are inflamed, mucus and air can become trapped. This can allow bacteria and other germs to grow and cause infection. Sinusitis can develop quickly and last only a short time (acute) or continue over a long period (chronic). Sinusitis that lasts for more than 12 weeks is considered chronic.  CAUSES  Causes of sinusitis include:  Allergies.  Structural abnormalities, such as displacement of the cartilage that separates your nostrils (deviated septum), which can decrease the air flow through your nose and sinuses and affect sinus drainage.  Functional abnormalities, such as when the small hairs (cilia) that line your sinuses and help remove mucus do not work properly or are not present. SYMPTOMS  Symptoms of acute and chronic sinusitis are the same. The primary symptoms are pain and pressure around the affected sinuses. Other symptoms include:  Upper toothache.  Earache.  Headache.  Bad breath.  Decreased sense of smell and taste.  A cough, which worsens when you are lying flat.  Fatigue.  Fever.  Thick drainage from your nose, which often is green and may contain pus (purulent).  Swelling and warmth over the affected sinuses. DIAGNOSIS  Your caregiver will perform a physical exam. During the exam, your caregiver may:  Look in your nose for signs of abnormal growths in your nostrils (nasal polyps).  Tap over the affected sinus to  check for signs of infection.  View the inside of your sinuses (endoscopy) with a special imaging device with a light attached (endoscope), which is inserted into your sinuses. If your caregiver suspects that you have chronic sinusitis, one or more of the following tests may be recommended:  Allergy tests.  Nasal culture A sample of mucus is taken from your nose and sent to a lab and screened for bacteria.  Nasal cytology A sample of mucus is taken from your nose and examined by your caregiver to determine if your sinusitis is related to an allergy. TREATMENT  Most cases of acute sinusitis are related to a viral infection and will resolve on their own within 10 days. Sometimes medicines are prescribed to help relieve symptoms (pain medicine, decongestants, nasal steroid sprays, or saline sprays).  However, for sinusitis related to a bacterial infection, your caregiver will prescribe antibiotic medicines. These are medicines that will help kill the bacteria causing the infection.  Rarely, sinusitis is caused by a fungal infection. In theses cases, your caregiver will prescribe antifungal medicine. For some cases of chronic sinusitis, surgery is needed. Generally, these are cases in which sinusitis recurs more than 3 times per year, despite other treatments. HOME CARE INSTRUCTIONS   Drink plenty of water.  Water helps thin the mucus so your sinuses can drain more easily.  Use a humidifier.  Inhale steam 3 to 4 times a day (for example, sit in the bathroom with the shower running).  Apply a warm, moist washcloth to your face 3 to 4 times a day, or as directed by your caregiver.  Use saline nasal sprays to help moisten and clean your sinuses.  Take over-the-counter or prescription medicines for pain, discomfort, or fever only as directed by your caregiver. SEEK IMMEDIATE MEDICAL CARE IF:  You have increasing pain or severe headaches.  You have nausea, vomiting, or drowsiness.  You have  swelling around your face.  You have vision problems.  You have a stiff neck.  You have difficulty breathing. MAKE SURE YOU:   Understand these instructions.  Will watch your condition.  Will get help right away if you are not doing well or get worse. Document Released: 02/26/2005 Document Revised: 05/21/2011 Document Reviewed: 03/13/2011 [facility] Patient Information 2013 [place], [place].

## 2012-03-26 NOTE — Progress Notes (Signed)
  Subjective:    Patient ID: Casey Sweeney, female    DOB: [birth year], 32 y.o.   MRN: [id number]  HPI  Casey Sweeney is here for a string check. She has had her second Mirena in place for about 6-8 weeks. The only complaints is that of some irreg vaginal bleeding.  Review of Systems She declines a flu vaccine or lipid exam    Objective:   Physical Exam   IUD string seen, about 2 cm in length Normal discharge     Assessment & Plan:  Irregular bleeding with Mirena I have given her a sample pack of Lo loestrin to be used on a prn basis. RTC 1 year/prn sooner

## 2012-03-28 LAB — COMPREHENSIVE METABOLIC PANEL
ALT: 23 U/L (ref 0–35)
AST: 18 U/L (ref 0–37)
Albumin: 4.7 g/dL (ref 3.5–5.2)
Alkaline Phosphatase: 72 U/L (ref 39–117)
BUN: 17 mg/dL (ref 6–23)
CO2: 26 mEq/L (ref 19–32)
Calcium: 9.4 mg/dL (ref 8.4–10.5)
Chloride: 107 mEq/L (ref 96–112)
Creat: 0.77 mg/dL (ref 0.50–1.10)
Glucose, Bld: 82 mg/dL (ref 70–99)
Potassium: 4.1 mEq/L (ref 3.5–5.3)
Sodium: 142 mEq/L (ref 135–145)
Total Bilirubin: 0.7 mg/dL (ref 0.3–1.2)
Total Protein: 6.7 g/dL (ref 6.0–8.3)

## 2012-03-28 LAB — VITAMIN B12: Vitamin B-12: 349 pg/mL (ref 211–911)

## 2012-03-28 LAB — TSH: TSH: 2.042 u[IU]/mL (ref 0.350–4.500)

## 2012-03-28 LAB — LIPID PANEL
Cholesterol: 126 mg/dL (ref 0–200)
HDL: 28 mg/dL — ABNORMAL LOW (ref >39)
LDL Cholesterol: 87 mg/dL (ref 0–99)
Total CHOL/HDL Ratio: 4.5 Ratio
VLDL: 11 mg/dL (ref 0–40)

## 2012-03-29 LAB — VITAMIN D 25 HYDROXY (VIT D DEFICIENCY, FRACTURES): Vit D, 25-Hydroxy: 16 ng/mL — ABNORMAL LOW (ref 30–89)

## 2012-04-18 ENCOUNTER — Telehealth: Payer: Self-pay | Admitting: *Deleted

## 2012-04-18 NOTE — Telephone Encounter (Signed)
Left detailed message on VM of PA instructions and instructed to call back if any questions

## 2012-04-18 NOTE — Telephone Encounter (Signed)
Pt calls and states has been taking the Zoloft 1 whole tab now for about 2 1/2 weeks and she is so tired she can barely function. Asked her if she felt that way when she was taking that 1/2 tab for the first week and she said no she felt fine. Please advise

## 2012-04-18 NOTE — Telephone Encounter (Signed)
Make sure she is taking at bedtime. If not taking at bedtime start at full tab. If already taking at bedtime decrease to 1/2 tab at night. Follow up if still having anxiety concerns.

## 2012-04-28 ENCOUNTER — Encounter: Payer: Self-pay | Admitting: Physician Assistant

## 2012-04-28 ENCOUNTER — Ambulatory Visit (INDEPENDENT_AMBULATORY_CARE_PROVIDER_SITE_OTHER): Payer: BC Managed Care – PPO | Admitting: Physician Assistant

## 2012-04-28 VITALS — BP 126/81 | HR 94 | Wt 138.0 lb

## 2012-04-28 DIAGNOSIS — J019 Acute sinusitis, unspecified: Secondary | ICD-10-CM

## 2012-04-28 DIAGNOSIS — J329 Chronic sinusitis, unspecified: Secondary | ICD-10-CM

## 2012-04-28 MED ORDER — METHYLPREDNISOLONE SODIUM SUCC 125 MG IJ SOLR: 125.0000 mg | Freq: Once | INTRAMUSCULAR | Status: DC

## 2012-04-28 MED ORDER — METHYLPREDNISOLONE SODIUM SUCC 125 MG IJ SOLR
125.0000 mg | Freq: Once | INTRAMUSCULAR | Status: AC
  Administered 2012-04-28: 125 mg via INTRAMUSCULAR

## 2012-04-28 MED ORDER — AZITHROMYCIN 250 MG PO TABS: ORAL_TABLET | ORAL | Status: DC

## 2012-04-28 MED ORDER — SODIUM CHLORIDE 0.9 % IV SOLN: 125.0000 mg | Freq: Once | INTRAVENOUS | Status: DC

## 2012-04-28 NOTE — Progress Notes (Signed)
  Subjective:    Patient ID: Casey Sweeney, female    DOB: [birth year], 32 y.o.   MRN: [id number]  HPI Patient is a 32 yo female who presents with sinus pressure, sore throat, ear pain. She was treated 03/26/12 for sinus infection. All symptoms resolved and then about a week ago sinus pressure started back up with nasal congestion, watery eyes, sore throat and painful swollen glands. She was taking Claritin daily and does not seem to help. She leaves in a old home with lots of dust and allergens. She has never been allergy tested. She denies any fever, chills, muscle pain, n/v/d. She has not had any cough, SOB, wheezing. She takes a decongestant almost everyday and does help some.    Review of Systems     Objective:   Physical Exam  Constitutional: She appears well-developed and well-nourished.  HENT:  Head: Normocephalic and atraumatic.  Right Ear: External ear normal.  Left Ear: External ear normal.  Mouth/Throat: No oropharyngeal exudate.  TM's clear bilaterally.   Oropharynx red and tonsils enlarged.   Turbinates red and swollen.   Maxillary tenderness to palpation bilaterally.    Eyes: Conjunctivae are normal. Right eye exhibits no discharge. Left eye exhibits no discharge.  Clear watery discharge from eyes.   Neck: Normal range of motion. Neck supple.  Tenderness to palpation over right sided anterior cervical adenopathy. Left side adenopathy not tender to touch.  Cardiovascular: Normal rate, regular rhythm and normal heart sounds.   Pulmonary/Chest: Effort normal and breath sounds normal. She has no wheezes.  Lymphadenopathy:    She has cervical adenopathy.  Skin: Skin is warm and dry.  Psychiatric: She has a normal mood and affect. Her behavior is normal.          Assessment & Plan:  Recurrent sinusitis- Gave solu-medrol injection today and treated with zpak. Used zpak since previously tried augmentin. I really feel like allergies start progression toward infection.   If starts to feel congestion again I want to send her to ENT/allergist for allergy testing. Continue Claritin daily. Use nasal spray flonase daily. Call if not improving.

## 2012-04-28 NOTE — Patient Instructions (Signed)
May need allergy testing.   Start zpak.

## 2012-04-28 NOTE — Addendum Note (Signed)
Addended by: Juel Burrow on: 04/28/2012 01:34 PM   Modules accepted: Orders

## 2012-05-19 ENCOUNTER — Ambulatory Visit (INDEPENDENT_AMBULATORY_CARE_PROVIDER_SITE_OTHER): Payer: BC Managed Care – PPO | Admitting: Physician Assistant

## 2012-05-19 ENCOUNTER — Encounter: Payer: Self-pay | Admitting: Physician Assistant

## 2012-05-19 ENCOUNTER — Telehealth: Payer: Self-pay

## 2012-05-19 VITALS — BP 123/74 | HR 104 | Wt 141.0 lb

## 2012-05-19 DIAGNOSIS — E559 Vitamin D deficiency, unspecified: Secondary | ICD-10-CM

## 2012-05-19 DIAGNOSIS — J302 Other seasonal allergic rhinitis: Secondary | ICD-10-CM

## 2012-05-19 DIAGNOSIS — J329 Chronic sinusitis, unspecified: Secondary | ICD-10-CM

## 2012-05-19 DIAGNOSIS — J309 Allergic rhinitis, unspecified: Secondary | ICD-10-CM

## 2012-05-19 MED ORDER — LORATADINE-PSEUDOEPHEDRINE ER 10-240 MG PO TB24: 1.0000 | ORAL_TABLET | Freq: Every day | ORAL | Status: DC

## 2012-05-19 MED ORDER — METHYLPREDNISOLONE (PAK) 4 MG PO TABS: 4.0000 mg | ORAL_TABLET | Freq: Every day | ORAL | Status: DC

## 2012-05-19 MED ORDER — SERTRALINE HCL 25 MG PO TABS: 25.0000 mg | ORAL_TABLET | Freq: Every day | ORAL | Status: DC

## 2012-05-19 MED ORDER — FLUTICASONE PROPIONATE 50 MCG/ACT NA SUSP: 2.0000 | Freq: Every day | NASAL | Status: DC

## 2012-05-19 NOTE — Progress Notes (Signed)
  Subjective:    Patient ID: Casey Sweeney, female    DOB: 10-Feb-1981, 32 y.o.   MRN: [id number]  HPI Patient returns to the clinic with more sinus pressure, eyes watery, and congestion. She was treated for sinus infection with antibiotic/flonase/prednisone shot not even 1 month ago. She felt great while on treatment and then about a week after. Congestion slowly creeped back in. Pt is already taking claritin daily and does not feel like nasal spray works. She denies a fever, chills, muscle aches, n/v/d. She has had these symptoms off and on for over 6 months. The biggest problems is pressure all over her head. Denies any ear pain. She had her sinuses xrayed in January and showed no problems with sinus cavities or signs of chronic sinusitis.   Needs refill on medication for depression and anxiety. Only taking 25mg  daily which she feels controls her feelings of anxiety and depression. Increasing dose made her sleepy. Denies any suicidal thoughts.    Review of Systems     Objective:   Physical Exam  Constitutional: She is oriented to person, place, and time. She appears well-developed and well-nourished.  HENT:  Head: Normocephalic and atraumatic.  Right Ear: External ear normal.  Left Ear: External ear normal.  Mouth/Throat: Oropharynx is clear and moist.  TM's clear.   Turbinates extremely red and irritated.   Eyes:  Bilateral watery discharge from both eyes. Conjunctiva injected.  Neck: Normal range of motion. Neck supple.  Cardiovascular: Normal rate, regular rhythm and normal heart sounds.   Pulmonary/Chest: Effort normal and breath sounds normal.  Lymphadenopathy:    She has no cervical adenopathy.  Neurological: She is alert and oriented to person, place, and time.  Skin: Skin is warm and dry.  Cheeks flushed.  Psychiatric: She has a normal mood and affect. Her behavior is normal.          Assessment & Plan:  Chronic sinusitis/allergies- I really think pt would benefit  from allergy testing and shots. No signs of bacterial infection or that need to be treated with abx. Will give another round of steroid taper to help with inflammation. Start Claritin D daily. Continue with nasal spray. If not improving need to send to ENT or consider CT of sinuses.   Depression/anxiety- refilled zoloft.  Will recheck vitamin D to make sure on right dose.

## 2012-05-19 NOTE — Telephone Encounter (Signed)
Patient advised to take the Medrol Dospack as directed on the package.

## 2012-05-22 ENCOUNTER — Telehealth: Payer: Self-pay

## 2012-05-22 DIAGNOSIS — J302 Other seasonal allergic rhinitis: Secondary | ICD-10-CM

## 2012-05-22 DIAGNOSIS — J329 Chronic sinusitis, unspecified: Secondary | ICD-10-CM

## 2012-05-22 NOTE — Telephone Encounter (Signed)
[place] called stating [name] advised her to call back if she was not better and she would prescribe an antibiotic. In the office note [name] states the next step would be ENT referral or CT of the sinuses. Brittanee would like me to send the note to [place] even though [name] will not return until tomorrow.

## 2012-05-23 MED ORDER — AZITHROMYCIN 250 MG PO TABS: ORAL_TABLET | ORAL | Status: DC

## 2012-05-23 NOTE — Telephone Encounter (Signed)
Sent!

## 2012-05-23 NOTE — Telephone Encounter (Signed)
Pt notified med sent and would like to see an allergist. Please put in referral. Barry Dienes, LPN

## 2012-05-23 NOTE — Telephone Encounter (Signed)
Ok i will send in another abx. I think an allergist might be a better fit for you? I truly believe this is allergies and maybe you would be a candidate for shots? Would you rather be sent to ENT or Allergist first?

## 2012-05-24 ENCOUNTER — Other Ambulatory Visit: Payer: Self-pay | Admitting: Physician Assistant

## 2012-08-06 ENCOUNTER — Encounter: Payer: Self-pay | Admitting: Physician Assistant

## 2012-08-06 ENCOUNTER — Ambulatory Visit (INDEPENDENT_AMBULATORY_CARE_PROVIDER_SITE_OTHER): Payer: BC Managed Care – PPO | Admitting: Physician Assistant

## 2012-08-06 ENCOUNTER — Telehealth: Payer: Self-pay | Admitting: *Deleted

## 2012-08-06 VITALS — BP 138/83 | HR 113 | Wt 144.0 lb

## 2012-08-06 DIAGNOSIS — M549 Dorsalgia, unspecified: Secondary | ICD-10-CM

## 2012-08-06 DIAGNOSIS — R509 Fever, unspecified: Secondary | ICD-10-CM

## 2012-08-06 DIAGNOSIS — R6883 Chills (without fever): Secondary | ICD-10-CM

## 2012-08-06 DIAGNOSIS — N390 Urinary tract infection, site not specified: Secondary | ICD-10-CM

## 2012-08-06 DIAGNOSIS — R52 Pain, unspecified: Secondary | ICD-10-CM

## 2012-08-06 LAB — POCT URINALYSIS DIPSTICK
Glucose, UA: NEGATIVE
Nitrite, UA: NEGATIVE
Spec Grav, UA: 1.02
pH, UA: 6.5

## 2012-08-06 MED ORDER — CIPROFLOXACIN HCL 500 MG PO TABS: 500.0000 mg | ORAL_TABLET | Freq: Two times a day (BID) | ORAL | Status: DC

## 2012-08-06 NOTE — Progress Notes (Signed)
  Subjective:    Patient ID: Casey Sweeney, female    DOB: 09/17/1980, 32 y.o.   MRN: [id number]  HPI Patient presents to the clinic with 2 days of fever, headache, body aches. She denies any n/v/d. She continues to have an appetite. Ears feel congested but no pain or discharge. She get's chills and sweats. Highest temperature was 103. 98.3 in office today. Denies any urinary symptoms but back is very tender. Taking ibuprofen for fever and pain.    Review of Systems     Objective:   Physical Exam  Constitutional: She is oriented to person, place, and time.  Laying on exam table when entered room.   HENT:  Head: Normocephalic and atraumatic.  Right Ear: External ear normal.  Left Ear: External ear normal.  Nose: Nose normal.  Mouth/Throat: Oropharynx is clear and moist.  Eyes: Conjunctivae are normal.  Neck: Normal range of motion. Neck supple.  Cardiovascular: Normal rate, regular rhythm and normal heart sounds.   Pulmonary/Chest: Effort normal and breath sounds normal.  Positive for CVA tenderness.   Abdominal: Soft. Bowel sounds are normal.  Generalized tenderness over entire abdomen.  Lymphadenopathy:    She has no cervical adenopathy.  Neurological: She is alert and oriented to person, place, and time.  Skin: Skin is warm and dry.  Psychiatric: She has a normal mood and affect. Her behavior is normal.          Assessment & Plan:  UTI/fever/chilss- UA positive for blood and leuks. Treated with cipro for 7 days. Call if not improving. Discussed with patient upper respiratory systems looks great. No signs of any other infection going on. Has hx of sinus infection but stopped claritin D due to cost. Start taking OTC sudafed and see if can afford this could help relieve sinus pressure.

## 2012-08-06 NOTE — Patient Instructions (Signed)
Urinary Tract Infection  Urinary tract infections (UTIs) can develop anywhere along your urinary tract. Your urinary tract is your body's drainage system for removing wastes and extra water. Your urinary tract includes two kidneys, two ureters, a bladder, and a urethra. Your kidneys are a pair of bean-shaped organs. Each kidney is about the size of your fist. They are located below your ribs, one on each side of your spine.  CAUSES  Infections are caused by microbes, which are microscopic organisms, including fungi, viruses, and bacteria. These organisms are so small that they can only be seen through a microscope. Bacteria are the microbes that most commonly cause UTIs.  SYMPTOMS   Symptoms of UTIs may vary by age and gender of the patient and by the location of the infection. Symptoms in young women typically include a frequent and intense urge to urinate and a painful, burning feeling in the bladder or urethra during urination. Older women and men are more likely to be tired, shaky, and weak and have muscle aches and abdominal pain. A fever may mean the infection is in your kidneys. Other symptoms of a kidney infection include pain in your back or sides below the ribs, nausea, and vomiting.  DIAGNOSIS  To diagnose a UTI, your caregiver will ask you about your symptoms. Your caregiver also will ask to provide a urine sample. The urine sample will be tested for bacteria and white blood cells. White blood cells are made by your body to help fight infection.  TREATMENT   Typically, UTIs can be treated with medication. Because most UTIs are caused by a bacterial infection, they usually can be treated with the use of antibiotics. The choice of antibiotic and length of treatment depend on your symptoms and the type of bacteria causing your infection.  HOME CARE INSTRUCTIONS   If you were prescribed antibiotics, take them exactly as your caregiver instructs you. Finish the medication even if you feel better after you  have only taken some of the medication.   Drink enough water and fluids to keep your urine clear or pale yellow.   Avoid caffeine, tea, and carbonated beverages. They tend to irritate your bladder.   Empty your bladder often. Avoid holding urine for long periods of time.   Empty your bladder before and after sexual intercourse.   After a bowel movement, women should cleanse from front to back. Use each tissue only once.  SEEK MEDICAL CARE IF:    You have back pain.   You develop a fever.   Your symptoms do not begin to resolve within 3 days.  SEEK IMMEDIATE MEDICAL CARE IF:    You have severe back pain or lower abdominal pain.   You develop chills.   You have nausea or vomiting.   You have continued burning or discomfort with urination.  MAKE SURE YOU:    Understand these instructions.   Will watch your condition.   Will get help right away if you are not doing well or get worse.  Document Released: 12/06/2004 Document Revised: 08/28/2011 Document Reviewed: 04/06/2011  [employer] Patient Information 2014 [employer].

## 2012-08-07 ENCOUNTER — Telehealth: Payer: Self-pay | Admitting: *Deleted

## 2012-08-07 DIAGNOSIS — N390 Urinary tract infection, site not specified: Secondary | ICD-10-CM

## 2012-08-07 DIAGNOSIS — R109 Unspecified abdominal pain: Secondary | ICD-10-CM

## 2012-08-07 NOTE — Telephone Encounter (Signed)
Pt calls today & states that she still has a fever & her kidneys & stomach are still hurting. She states that everywhere from "boobs to butt" hurt & it wasn't before.  She wants to know if this is normal or if she could be taking the wrong med? Please advise. She is Jade's pt.

## 2012-08-07 NOTE — Telephone Encounter (Signed)
Does she have a sore throat?  We can orer a CBC w/ diff and urine culture.  Ok to order and she can go today for labs.

## 2012-08-08 ENCOUNTER — Other Ambulatory Visit (HOSPITAL_BASED_OUTPATIENT_CLINIC_OR_DEPARTMENT_OTHER): Payer: BC Managed Care – PPO

## 2012-08-08 ENCOUNTER — Other Ambulatory Visit: Payer: Self-pay | Admitting: Physician Assistant

## 2012-08-08 DIAGNOSIS — D72829 Elevated white blood cell count, unspecified: Secondary | ICD-10-CM

## 2012-08-08 DIAGNOSIS — R52 Pain, unspecified: Secondary | ICD-10-CM

## 2012-08-08 DIAGNOSIS — R109 Unspecified abdominal pain: Secondary | ICD-10-CM

## 2012-08-08 LAB — CBC WITH DIFFERENTIAL/PLATELET
Eosinophils Absolute: 0.1 10*3/uL (ref 0.0–0.7)
HCT: 41.1 % (ref 36.0–46.0)
Hemoglobin: 14 g/dL (ref 12.0–15.0)
Lymphs Abs: 1.5 10*3/uL (ref 0.7–4.0)
MCH: 31.5 pg (ref 26.0–34.0)
Monocytes Absolute: 1.2 10*3/uL — ABNORMAL HIGH (ref 0.1–1.0)
Monocytes Relative: 11 % (ref 3–12)
Neutrophils Relative %: 74 % (ref 43–77)
RBC: 4.44 MIL/uL (ref 3.87–5.11)

## 2012-08-08 NOTE — Telephone Encounter (Signed)
Pt states that she does not have a sore throat. Labs ordered.

## 2012-08-08 NOTE — Progress Notes (Signed)
Pt calls because having "worse pain in left flank area" she is still running a fever at home. She is on cipro. She comes in but does not appear to be in severe pain. WBC's was elevated. Will look for stones and order CT.Marland Kitchen

## 2012-08-09 ENCOUNTER — Ambulatory Visit (HOSPITAL_BASED_OUTPATIENT_CLINIC_OR_DEPARTMENT_OTHER)
Admission: RE | Admit: 2012-08-09 | Discharge: 2012-08-09 | Disposition: A | Discharge status: Home / Self Care | Payer: BC Managed Care – PPO | Source: Ambulatory Visit | Attending: Physician Assistant | Admitting: Physician Assistant

## 2012-08-09 DIAGNOSIS — M5137 Other intervertebral disc degeneration, lumbosacral region: Secondary | ICD-10-CM | POA: Insufficient documentation

## 2012-08-09 DIAGNOSIS — R109 Unspecified abdominal pain: Secondary | ICD-10-CM | POA: Insufficient documentation

## 2012-08-09 DIAGNOSIS — M51379 Other intervertebral disc degeneration, lumbosacral region without mention of lumbar back pain or lower extremity pain: Secondary | ICD-10-CM | POA: Insufficient documentation

## 2012-08-09 DIAGNOSIS — R52 Pain, unspecified: Secondary | ICD-10-CM

## 2012-08-09 DIAGNOSIS — R319 Hematuria, unspecified: Secondary | ICD-10-CM | POA: Insufficient documentation

## 2012-08-09 DIAGNOSIS — K8020 Calculus of gallbladder without cholecystitis without obstruction: Secondary | ICD-10-CM | POA: Insufficient documentation

## 2012-08-09 DIAGNOSIS — D72829 Elevated white blood cell count, unspecified: Secondary | ICD-10-CM | POA: Insufficient documentation

## 2012-08-09 LAB — VITAMIN D 25 HYDROXY (VIT D DEFICIENCY, FRACTURES): Vit D, 25-Hydroxy: 47 ng/mL (ref 30–89)

## 2012-08-10 LAB — URINE CULTURE: Colony Count: 45000

## 2012-08-11 ENCOUNTER — Other Ambulatory Visit: Payer: Self-pay | Admitting: Physician Assistant

## 2012-08-11 ENCOUNTER — Encounter: Payer: Self-pay | Admitting: Gastroenterology

## 2012-08-11 DIAGNOSIS — R109 Unspecified abdominal pain: Secondary | ICD-10-CM

## 2012-08-11 DIAGNOSIS — K8020 Calculus of gallbladder without cholecystitis without obstruction: Secondary | ICD-10-CM

## 2012-08-11 MED ORDER — LEVOFLOXACIN 500 MG PO TABS: 500.0000 mg | ORAL_TABLET | Freq: Every day | ORAL | Status: DC

## 2012-09-01 ENCOUNTER — Ambulatory Visit: Payer: BC Managed Care – PPO | Admitting: Gastroenterology

## 2012-09-16 ENCOUNTER — Ambulatory Visit (INDEPENDENT_AMBULATORY_CARE_PROVIDER_SITE_OTHER): Payer: BC Managed Care – PPO

## 2012-09-16 ENCOUNTER — Ambulatory Visit (INDEPENDENT_AMBULATORY_CARE_PROVIDER_SITE_OTHER): Payer: BC Managed Care – PPO | Admitting: Sports Medicine

## 2012-09-16 ENCOUNTER — Other Ambulatory Visit: Payer: Self-pay | Admitting: Sports Medicine

## 2012-09-16 ENCOUNTER — Encounter: Payer: Self-pay | Admitting: Sports Medicine

## 2012-09-16 VITALS — BP 122/77 | HR 100 | Wt 145.0 lb

## 2012-09-16 DIAGNOSIS — M222X2 Patellofemoral disorders, left knee: Secondary | ICD-10-CM | POA: Insufficient documentation

## 2012-09-16 DIAGNOSIS — M25569 Pain in unspecified knee: Secondary | ICD-10-CM

## 2012-09-16 MED ORDER — MELOXICAM 15 MG PO TABS: ORAL_TABLET | ORAL | Status: DC

## 2012-09-16 NOTE — Progress Notes (Signed)
   Subjective:    I'm seeing this patient as a consultation for:  Casey Gaw, PA-C  CC: Left knee pain  HPI: This is a very pleasant 32 year old female, she's had pain in her left knee since a young age. She localizes her pain in the front of the knee, worse with going up and down stairs, bending her knee, and getting up out of a chair. She's been fairly resistant to physical therapy and exercises in the past. She has not been using any oral NSAIDs. Pain is localized, doesn't radiate, moderate to severe. She denies any mechanical symptoms, denies any swelling.  Past medical history, Surgical history, Family history not pertinant except as noted below, Social history, Allergies, and medications have been entered into the medical record, reviewed, and no changes needed.   Review of Systems: No headache, visual changes, nausea, vomiting, diarrhea, constipation, dizziness, abdominal pain, skin rash, fevers, chills, night sweats, weight loss, swollen lymph nodes, body aches, joint swelling, muscle aches, chest pain, shortness of breath, mood changes, visual or auditory hallucinations.   Objective:   General: Well Developed, well nourished, and in no acute distress.  Neuro/Psych: Alert and oriented x3, extra-ocular muscles intact, able to move all 4 extremities, sensation grossly intact. Skin: Warm and dry, no rashes noted.  Respiratory: Not using accessory muscles, speaking in full sentences, trachea midline.  Cardiovascular: Pulses palpable, no extremity edema. Abdomen: Does not appear distended. Left Knee: Normal to inspection with no erythema or effusion or obvious bony abnormalities. Palpation normal with no warmth, joint line tenderness, patellar tenderness, or condyle tenderness. ROM full in flexion and extension and lower leg rotation. Ligaments with solid consistent endpoints including ACL, PCL, LCL, MCL. Negative Mcmurray's, Apley's, and Thessalonian tests. Non painful patellar  compression. Patellar glide without crepitus. Patellar and quadriceps tendons unremarkable. Poor vastus medialis definition on the left side.  X-rays are reviewed and show minimal spurring around the patella. Impression and Recommendations:   This case required medical decision making of moderate complexity.

## 2012-09-16 NOTE — Assessment & Plan Note (Signed)
Start conservatively, home exercises, Mobic, x-rays, she has a patellar stabilizing brace at home and will wear this daily. By to see her back in 34 weeks, we can consider interventional treatment if no better.

## 2012-09-23 ENCOUNTER — Telehealth: Payer: Self-pay | Admitting: *Deleted

## 2012-09-23 DIAGNOSIS — R109 Unspecified abdominal pain: Secondary | ICD-10-CM

## 2012-09-23 DIAGNOSIS — K8020 Calculus of gallbladder without cholecystitis without obstruction: Secondary | ICD-10-CM

## 2012-09-23 NOTE — Telephone Encounter (Signed)
Referral placed for general surgery for gallstones.

## 2012-10-06 ENCOUNTER — Encounter (INDEPENDENT_AMBULATORY_CARE_PROVIDER_SITE_OTHER): Payer: Self-pay | Admitting: General Surgery

## 2012-10-06 ENCOUNTER — Telehealth (INDEPENDENT_AMBULATORY_CARE_PROVIDER_SITE_OTHER): Payer: Self-pay | Admitting: General Surgery

## 2012-10-06 ENCOUNTER — Ambulatory Visit (INDEPENDENT_AMBULATORY_CARE_PROVIDER_SITE_OTHER): Payer: BC Managed Care – PPO | Admitting: General Surgery

## 2012-10-06 VITALS — BP 98/66 | HR 80 | Temp 97.5°F | Resp 16 | Ht 68.0 in | Wt 142.0 lb

## 2012-10-06 DIAGNOSIS — K8020 Calculus of gallbladder without cholecystitis without obstruction: Secondary | ICD-10-CM

## 2012-10-06 NOTE — Progress Notes (Signed)
Patient ID: Casey Sweeney, female   DOB: 1980/03/15, 32 y.o.   MRN: [id number]  Chief Complaint  Patient presents with  . New Evaluation    eval gallstones    HPI Casey Sweeney is a 32 y.o. female.  The patient's 32 year old female with history of right upper quadrant pain for several years. The patient states the pain is worse with high fatty meals. The patient also been diagnosed with questionable IBS for multiple years with on and off diarrhea and constipation. Patient with a CT scan which revealed a contracted gallbladder with multiple stones  HPI  Past Medical History  Diagnosis Date  . Anxiety   . Depression   . Irritable bowel syndrome     History reviewed. No pertinent past surgical history.  Family History  Problem Relation Age of Onset  . Cancer Other 80    unknown cancer  . Cancer Maternal Grandfather     unknown  . Hypertension Mother   . Cancer Paternal Aunt     breast    Social History History  Substance Use Topics  . Smoking status: Current Every Day Smoker -- 1.00 packs/day for 10 years    Types: Cigarettes  . Smokeless tobacco: Never Used  . Alcohol Use: No    No Known Allergies  Current Outpatient Prescriptions  Medication Sig Dispense Refill  . escitalopram (LEXAPRO) 20 MG tablet Take 20 mg by mouth daily.       No current facility-administered medications for this visit.    Review of Systems Review of Systems  Constitutional: Negative.   HENT: Negative.   Respiratory: Negative.   Cardiovascular: Negative.   Gastrointestinal: Positive for abdominal pain.  Musculoskeletal: Negative.   All other systems reviewed and are negative.    Blood pressure 98/66, pulse 80, temperature 97.5 F (36.4 C), temperature source Oral, resp. rate 16, height 5\' 8"  (1.727 m), weight 142 lb (64.411 kg).  Physical Exam Physical Exam  Constitutional: She is oriented to person, place, and time. She appears well-developed and well-nourished.  HENT:   Head: Normocephalic and atraumatic.  Eyes: Conjunctivae and EOM are normal. Pupils are equal, round, and reactive to light.  Neck: Normal range of motion. Neck supple.  Cardiovascular: Normal rate, regular rhythm and normal heart sounds.   Pulmonary/Chest: Effort normal.  Abdominal: Soft. There is tenderness (RUQ). There is no rebound and no guarding.  Musculoskeletal: Normal range of motion.  Neurological: She is alert and oriented to person, place, and time.    Data Reviewed CT scan reveals contracted gallbladder with multiple stones LFTs within normal limits  Assessment    32 year old female With symptomatic cholelithiasis     Plan    1. We'll proceed to the operating room for laparoscopic cholecystectomy with no I. OEC 2. All risks and benefits were discussed with the patient to generally include: infection, bleeding, possible need for post op ERCP, damage to the bile ducts, and bile leak. Alternatives were offered and described.  All questions were answered and the patient voiced understanding of the procedure and wishes to proceed at this point with a laparoscopic cholecystectomy         Marigene Ehlers., Jed Limerick 10/06/2012, 1:49 PM

## 2012-10-06 NOTE — Telephone Encounter (Signed)
Discussed pt financial responsibility and put in pending folder °

## 2012-10-15 ENCOUNTER — Encounter (HOSPITAL_COMMUNITY): Payer: Self-pay | Admitting: Pharmacy Technician

## 2012-10-16 ENCOUNTER — Encounter (HOSPITAL_COMMUNITY)
Admission: RE | Admit: 2012-10-16 | Discharge: 2012-10-16 | Disposition: A | Discharge status: Home / Self Care | Payer: BC Managed Care – PPO | Source: Ambulatory Visit | Attending: General Surgery | Admitting: General Surgery

## 2012-10-16 ENCOUNTER — Encounter (HOSPITAL_COMMUNITY): Payer: Self-pay

## 2012-10-16 ENCOUNTER — Other Ambulatory Visit (INDEPENDENT_AMBULATORY_CARE_PROVIDER_SITE_OTHER): Payer: Self-pay | Admitting: General Surgery

## 2012-10-16 HISTORY — DX: Calculus of kidney: N20.0

## 2012-10-16 HISTORY — DX: Personal history of urinary (tract) infections: Z87.440

## 2012-10-16 HISTORY — DX: Headache: R51

## 2012-10-16 LAB — CBC
HCT: 43 % (ref 36.0–46.0)
Hemoglobin: 14.8 g/dL (ref 12.0–15.0)
MCH: 31.5 pg (ref 26.0–34.0)
MCV: 91.5 fL (ref 78.0–100.0)
RBC: 4.7 MIL/uL (ref 3.87–5.11)
WBC: 8.3 10*3/uL (ref 4.0–10.5)

## 2012-10-16 MED ORDER — CHLORHEXIDINE GLUCONATE 4 % EX LIQD: 1.0000 "application " | Freq: Once | CUTANEOUS | Status: DC

## 2012-10-16 MED ORDER — CEFAZOLIN SODIUM-DEXTROSE 2-3 GM-% IV SOLR
2.0000 g | INTRAVENOUS | Status: AC
  Administered 2012-10-17: 2 g via INTRAVENOUS
  Filled 2012-10-16: qty 50

## 2012-10-16 NOTE — Pre-Procedure Instructions (Signed)
CONSUELLA SCURLOCK  10/16/2012   Your procedure is scheduled on:  October 17, 2012 at 7:30 AM  Report to [facility] at 5:30 AM.  Call this number if you have problems the morning of surgery: [phone]   Remember:   Do not eat food or drink liquids after midnight.   Take these medicines the morning of surgery with A SIP OF WATER: sertraline (ZOLOFT)   Do not wear jewelry, make-up or nail polish.  Do not wear lotions, powders, or perfumes. You may wear deodorant.  Do not shave 48 hours prior to surgery. Men may shave face and neck.  Do not bring valuables to the hospital.  [facility] is not responsible                   for any belongings or valuables.  Contacts, dentures or bridgework may not be worn into surgery.  Leave suitcase in the car. After surgery it may be brought to your room.  For patients admitted to the hospital, checkout time is 11:00 AM the day of  discharge.   Patients discharged the day of surgery will not be allowed to drive  home.  Name and phone number of your driver: Family/friend  Special Instructions: Shower using CHG 2 nights before surgery and the night before surgery.  If you shower the day of surgery use CHG.  Use special wash - you have one bottle of CHG for all showers.  You should use approximately 1/3 of the bottle for each shower.   Please read over the following fact sheets that you were given: Pain Booklet, Coughing and Deep Breathing and Surgical Site Infection Prevention

## 2012-10-17 ENCOUNTER — Encounter (HOSPITAL_COMMUNITY): Payer: Self-pay | Admitting: Anesthesiology

## 2012-10-17 ENCOUNTER — Encounter (HOSPITAL_COMMUNITY)
Admission: RE | Disposition: A | Discharge status: Home / Self Care | Payer: Self-pay | Source: Ambulatory Visit | Attending: General Surgery

## 2012-10-17 ENCOUNTER — Encounter (HOSPITAL_COMMUNITY): Payer: Self-pay | Admitting: *Deleted

## 2012-10-17 ENCOUNTER — Ambulatory Visit (HOSPITAL_COMMUNITY): Payer: BC Managed Care – PPO | Admitting: Anesthesiology

## 2012-10-17 ENCOUNTER — Ambulatory Visit (HOSPITAL_COMMUNITY)
Admission: RE | Admit: 2012-10-17 | Discharge: 2012-10-17 | Disposition: A | Discharge status: Home / Self Care | Payer: BC Managed Care – PPO | Source: Ambulatory Visit | Attending: General Surgery | Admitting: General Surgery

## 2012-10-17 DIAGNOSIS — F329 Major depressive disorder, single episode, unspecified: Secondary | ICD-10-CM | POA: Insufficient documentation

## 2012-10-17 DIAGNOSIS — F17200 Nicotine dependence, unspecified, uncomplicated: Secondary | ICD-10-CM | POA: Insufficient documentation

## 2012-10-17 DIAGNOSIS — Z809 Family history of malignant neoplasm, unspecified: Secondary | ICD-10-CM | POA: Insufficient documentation

## 2012-10-17 DIAGNOSIS — K589 Irritable bowel syndrome without diarrhea: Secondary | ICD-10-CM | POA: Insufficient documentation

## 2012-10-17 DIAGNOSIS — Z79899 Other long term (current) drug therapy: Secondary | ICD-10-CM | POA: Insufficient documentation

## 2012-10-17 DIAGNOSIS — F411 Generalized anxiety disorder: Secondary | ICD-10-CM | POA: Insufficient documentation

## 2012-10-17 DIAGNOSIS — F3289 Other specified depressive episodes: Secondary | ICD-10-CM | POA: Insufficient documentation

## 2012-10-17 DIAGNOSIS — K8010 Calculus of gallbladder with chronic cholecystitis without obstruction: Secondary | ICD-10-CM | POA: Insufficient documentation

## 2012-10-17 HISTORY — PX: CHOLECYSTECTOMY: SHX55

## 2012-10-17 SURGERY — LAPAROSCOPIC CHOLECYSTECTOMY
Anesthesia: General | Site: Abdomen | Wound class: Clean Contaminated

## 2012-10-17 MED ORDER — HYDROMORPHONE HCL PF 1 MG/ML IJ SOLN
INTRAMUSCULAR | Status: AC
  Filled 2012-10-17: qty 1

## 2012-10-17 MED ORDER — LACTATED RINGERS IV SOLN
INTRAVENOUS | Status: DC | PRN
  Administered 2012-10-17 (×2): via INTRAVENOUS

## 2012-10-17 MED ORDER — ROCURONIUM BROMIDE 100 MG/10ML IV SOLN
INTRAVENOUS | Status: DC | PRN
  Administered 2012-10-17: 5 mg via INTRAVENOUS
  Administered 2012-10-17: 35 mg via INTRAVENOUS

## 2012-10-17 MED ORDER — HYDROMORPHONE HCL PF 1 MG/ML IJ SOLN
0.2500 mg | INTRAMUSCULAR | Status: DC | PRN
  Administered 2012-10-17 (×3): 0.5 mg via INTRAVENOUS

## 2012-10-17 MED ORDER — MIDAZOLAM HCL 5 MG/5ML IJ SOLN
INTRAMUSCULAR | Status: DC | PRN
  Administered 2012-10-17 (×2): 1 mg via INTRAVENOUS

## 2012-10-17 MED ORDER — NEOSTIGMINE METHYLSULFATE 1 MG/ML IJ SOLN
INTRAMUSCULAR | Status: DC | PRN
  Administered 2012-10-17: 4 mg via INTRAVENOUS

## 2012-10-17 MED ORDER — GLYCOPYRROLATE 0.2 MG/ML IJ SOLN
INTRAMUSCULAR | Status: DC | PRN
  Administered 2012-10-17: 0.6 mg via INTRAVENOUS

## 2012-10-17 MED ORDER — OXYCODONE HCL 5 MG PO TABS
5.0000 mg | ORAL_TABLET | ORAL | Status: DC | PRN
  Administered 2012-10-17 (×2): 10 mg via ORAL

## 2012-10-17 MED ORDER — ONDANSETRON HCL 4 MG/2ML IJ SOLN
INTRAMUSCULAR | Status: DC | PRN
  Administered 2012-10-17: 4 mg via INTRAVENOUS

## 2012-10-17 MED ORDER — 0.9 % SODIUM CHLORIDE (POUR BTL) OPTIME
TOPICAL | Status: DC | PRN
  Administered 2012-10-17: 1000 mL

## 2012-10-17 MED ORDER — EPHEDRINE SULFATE 50 MG/ML IJ SOLN
INTRAMUSCULAR | Status: DC | PRN
  Administered 2012-10-17: 10 mg via INTRAVENOUS

## 2012-10-17 MED ORDER — OXYCODONE HCL 5 MG PO TABS
ORAL_TABLET | ORAL | Status: AC
  Filled 2012-10-17: qty 2

## 2012-10-17 MED ORDER — BUPIVACAINE HCL (PF) 0.25 % IJ SOLN
INTRAMUSCULAR | Status: AC
  Filled 2012-10-17: qty 30

## 2012-10-17 MED ORDER — ARTIFICIAL TEARS OP OINT
TOPICAL_OINTMENT | OPHTHALMIC | Status: DC | PRN
  Administered 2012-10-17: 1 via OPHTHALMIC

## 2012-10-17 MED ORDER — LIDOCAINE HCL (CARDIAC) 20 MG/ML IV SOLN
INTRAVENOUS | Status: DC | PRN
  Administered 2012-10-17: 20 mg via INTRAVENOUS

## 2012-10-17 MED ORDER — BUPIVACAINE HCL 0.25 % IJ SOLN
INTRAMUSCULAR | Status: DC | PRN
  Administered 2012-10-17: 10 mL

## 2012-10-17 MED ORDER — ONDANSETRON HCL 4 MG/2ML IJ SOLN: 4.0000 mg | Freq: Four times a day (QID) | INTRAMUSCULAR | Status: DC | PRN

## 2012-10-17 MED ORDER — PROPOFOL 10 MG/ML IV BOLUS
INTRAVENOUS | Status: DC | PRN
  Administered 2012-10-17: 200 mg via INTRAVENOUS

## 2012-10-17 MED ORDER — SODIUM CHLORIDE 0.9 % IJ SOLN: 3.0000 mL | Freq: Two times a day (BID) | INTRAMUSCULAR | Status: DC

## 2012-10-17 MED ORDER — FENTANYL CITRATE 0.05 MG/ML IJ SOLN
INTRAMUSCULAR | Status: DC | PRN
  Administered 2012-10-17 (×4): 50 ug via INTRAVENOUS

## 2012-10-17 MED ORDER — SODIUM CHLORIDE 0.9 % IJ SOLN: 3.0000 mL | INTRAMUSCULAR | Status: DC | PRN

## 2012-10-17 MED ORDER — ACETAMINOPHEN 325 MG PO TABS: 650.0000 mg | ORAL_TABLET | ORAL | Status: DC | PRN

## 2012-10-17 MED ORDER — OXYCODONE HCL 5 MG/5ML PO SOLN: 5.0000 mg | Freq: Once | ORAL | Status: DC | PRN

## 2012-10-17 MED ORDER — ONDANSETRON HCL 4 MG/2ML IJ SOLN
INTRAMUSCULAR | Status: AC
  Administered 2012-10-17: 4 mg
  Filled 2012-10-17: qty 2

## 2012-10-17 MED ORDER — SODIUM CHLORIDE 0.9 % IV SOLN: 250.0000 mL | INTRAVENOUS | Status: DC | PRN

## 2012-10-17 MED ORDER — ACETAMINOPHEN 650 MG RE SUPP: 650.0000 mg | RECTAL | Status: DC | PRN

## 2012-10-17 MED ORDER — SODIUM CHLORIDE 0.9 % IR SOLN
Status: DC | PRN
  Administered 2012-10-17: 1000 mL

## 2012-10-17 MED ORDER — OXYCODONE HCL 5 MG PO TABS: 5.0000 mg | ORAL_TABLET | Freq: Once | ORAL | Status: DC | PRN

## 2012-10-17 MED ORDER — OXYCODONE-ACETAMINOPHEN 5-325 MG PO TABS: 1.0000 | ORAL_TABLET | ORAL | Status: DC | PRN

## 2012-10-17 MED ORDER — DEXTROSE 5 % IV SOLN
INTRAVENOUS | Status: DC | PRN
  Administered 2012-10-17: 08:00:00 via INTRAVENOUS

## 2012-10-17 SURGICAL SUPPLY — 45 items
APPLIER CLIP 5 13 M/L LIGAMAX5 (MISCELLANEOUS) ×2
BENZOIN TINCTURE PRP APPL 2/3 (GAUZE/BANDAGES/DRESSINGS) ×2 IMPLANT
CANISTER SUCTION 2500CC (MISCELLANEOUS) ×2 IMPLANT
CHLORAPREP W/TINT 26ML (MISCELLANEOUS) ×2 IMPLANT
CLIP APPLIE 5 13 M/L LIGAMAX5 (MISCELLANEOUS) ×1 IMPLANT
CLOTH BEACON ORANGE TIMEOUT ST (SAFETY) ×2 IMPLANT
COVER MAYO STAND STRL (DRAPES) ×2 IMPLANT
COVER SURGICAL LIGHT HANDLE (MISCELLANEOUS) ×2 IMPLANT
COVER TRANSDUCER ULTRASND (DRAPES) ×2 IMPLANT
DEVICE TROCAR PUNCTURE CLOSURE (ENDOMECHANICALS) ×2 IMPLANT
DRAPE C-ARM 42X72 X-RAY (DRAPES) ×2 IMPLANT
DRAPE UTILITY 15X26 W/TAPE STR (DRAPE) ×4 IMPLANT
ELECT REM PT RETURN 9FT ADLT (ELECTROSURGICAL) ×2
ELECTRODE REM PT RTRN 9FT ADLT (ELECTROSURGICAL) ×1 IMPLANT
GAUZE SPONGE 2X2 8PLY STRL LF (GAUZE/BANDAGES/DRESSINGS) ×1 IMPLANT
GLOVE BIO SURGEON STRL SZ 6.5 (GLOVE) ×2 IMPLANT
GLOVE BIO SURGEON STRL SZ7.5 (GLOVE) ×4 IMPLANT
GLOVE BIOGEL PI IND STRL 6.5 (GLOVE) ×1 IMPLANT
GLOVE BIOGEL PI IND STRL 7.0 (GLOVE) ×3 IMPLANT
GLOVE BIOGEL PI IND STRL 7.5 (GLOVE) ×2 IMPLANT
GLOVE BIOGEL PI INDICATOR 6.5 (GLOVE) ×1
GLOVE BIOGEL PI INDICATOR 7.0 (GLOVE) ×3
GLOVE BIOGEL PI INDICATOR 7.5 (GLOVE) ×2
GLOVE SURG SS PI 7.0 STRL IVOR (GLOVE) ×4 IMPLANT
GOWN STRL NON-REIN LRG LVL3 (GOWN DISPOSABLE) ×10 IMPLANT
GOWN STRL REIN XL XLG (GOWN DISPOSABLE) ×2 IMPLANT
IV CATH 14GX2 1/4 (CATHETERS) ×2 IMPLANT
KIT BASIN OR (CUSTOM PROCEDURE TRAY) ×2 IMPLANT
KIT ROOM TURNOVER OR (KITS) ×2 IMPLANT
NEEDLE INSUFFLATION 14GA 120MM (NEEDLE) ×2 IMPLANT
NS IRRIG 1000ML POUR BTL (IV SOLUTION) ×2 IMPLANT
PAD ARMBOARD 7.5X6 YLW CONV (MISCELLANEOUS) ×4 IMPLANT
POUCH SPECIMEN RETRIEVAL 10MM (ENDOMECHANICALS) IMPLANT
SCISSORS LAP 5X35 DISP (ENDOMECHANICALS) ×2 IMPLANT
SET CHOLANGIOGRAPHY FRANKLIN (SET/KITS/TRAYS/PACK) ×2 IMPLANT
SET IRRIG TUBING LAPAROSCOPIC (IRRIGATION / IRRIGATOR) ×2 IMPLANT
SLEEVE ENDOPATH XCEL 5M (ENDOMECHANICALS) ×2 IMPLANT
SPECIMEN JAR SMALL (MISCELLANEOUS) ×2 IMPLANT
SPONGE GAUZE 2X2 STER 10/PKG (GAUZE/BANDAGES/DRESSINGS) ×1
SUT MNCRL AB 3-0 PS2 18 (SUTURE) ×2 IMPLANT
TOWEL OR 17X24 6PK STRL BLUE (TOWEL DISPOSABLE) IMPLANT
TOWEL OR 17X26 10 PK STRL BLUE (TOWEL DISPOSABLE) ×2 IMPLANT
TRAY LAPAROSCOPIC (CUSTOM PROCEDURE TRAY) ×2 IMPLANT
TROCAR XCEL NON-BLD 11X100MML (ENDOMECHANICALS) ×2 IMPLANT
TROCAR XCEL NON-BLD 5MMX100MML (ENDOMECHANICALS) ×2 IMPLANT

## 2012-10-17 NOTE — Preoperative (Signed)
Beta Blockers   Reason not to administer Beta Blockers:Not Applicable 

## 2012-10-17 NOTE — Interval H&P Note (Signed)
History and Physical Interval Note:  10/17/2012 8:09 AM  Casey Sweeney  has presented today for surgery, with the diagnosis of SYMPTOMATIC CHOLELITHIASIS  The various methods of treatment have been discussed with the patient and family. After consideration of risks, benefits and other options for treatment, the patient has consented to  Procedure(s): LAPAROSCOPIC CHOLECYSTECTOMY (N/A) as a surgical intervention .  The patient's history has been reviewed, patient examined, no change in status, stable for surgery.  I have reviewed the patient's chart and labs.  Questions were answered to the patient's satisfaction.     Marigene Ehlers., Jed Limerick

## 2012-10-17 NOTE — Anesthesia Procedure Notes (Signed)
Procedure Name: Intubation Date/Time: 10/17/2012 8:25 AM Performed by: Darcey Nora B Pre-anesthesia Checklist: Patient identified, Emergency Drugs available, Suction available and Patient being monitored Patient Re-evaluated:Patient Re-evaluated prior to inductionOxygen Delivery Method: Circle system utilized Preoxygenation: Pre-oxygenation with 100% oxygen Intubation Type: IV induction Ventilation: Mask ventilation without difficulty Grade View: Grade II Tube size: 7.0 mm Number of attempts: 1 Airway Equipment and Method: Video-laryngoscopy Placement Confirmation: ETT inserted through vocal cords under direct vision,  breath sounds checked- equal and bilateral and positive ETCO2 Secured at: 21 (cm at teeth) cm Tube secured with: Tape Dental Injury: Teeth and Oropharynx as per pre-operative assessment

## 2012-10-17 NOTE — Transfer of Care (Signed)
Immediate Anesthesia Transfer of Care Note  Patient: Casey Sweeney  Procedure(s) Performed: Procedure(s): LAPAROSCOPIC CHOLECYSTECTOMY (N/A)  Patient Location: PACU  Anesthesia Type:General  Level of Consciousness: awake, alert  and patient cooperative  Airway & Oxygen Therapy: Patient Spontanous Breathing and Patient connected to nasal cannula oxygen  Post-op Assessment: Report given to PACU RN, Post -op Vital signs reviewed and stable and Patient moving all extremities  Post vital signs: Reviewed and stable  Complications: No apparent anesthesia complications

## 2012-10-17 NOTE — H&P (View-Only) (Signed)
Patient ID: Casey Sweeney, female   DOB: 07/06/1980, 32 y.o.   MRN: [id number]  Chief Complaint  Patient presents with  . New Evaluation    eval gallstones    HPI Casey Sweeney is a 32 y.o. female.  The patient's 32-year-old female with history of right upper quadrant pain for several years. The patient states the pain is worse with high fatty meals. The patient also been diagnosed with questionable IBS for multiple years with on and off diarrhea and constipation. Patient with a CT scan which revealed a contracted gallbladder with multiple stones  HPI  Past Medical History  Diagnosis Date  . Anxiety   . Depression   . Irritable bowel syndrome     History reviewed. No pertinent past surgical history.  Family History  Problem Relation Age of Onset  . Cancer Other 80    unknown cancer  . Cancer Maternal Grandfather     unknown  . Hypertension Mother   . Cancer Paternal Aunt     breast    Social History History  Substance Use Topics  . Smoking status: Current Every Day Smoker -- 1.00 packs/day for 10 years    Types: Cigarettes  . Smokeless tobacco: Never Used  . Alcohol Use: No    No Known Allergies  Current Outpatient Prescriptions  Medication Sig Dispense Refill  . escitalopram (LEXAPRO) 20 MG tablet Take 20 mg by mouth daily.       No current facility-administered medications for this visit.    Review of Systems Review of Systems  Constitutional: Negative.   HENT: Negative.   Respiratory: Negative.   Cardiovascular: Negative.   Gastrointestinal: Positive for abdominal pain.  Musculoskeletal: Negative.   All other systems reviewed and are negative.    Blood pressure 98/66, pulse 80, temperature 97.5 F (36.4 C), temperature source Oral, resp. rate 16, height 5' 8" (1.727 m), weight 142 lb (64.411 kg).  Physical Exam Physical Exam  Constitutional: She is oriented to person, place, and time. She appears well-developed and well-nourished.  HENT:   Head: Normocephalic and atraumatic.  Eyes: Conjunctivae and EOM are normal. Pupils are equal, round, and reactive to light.  Neck: Normal range of motion. Neck supple.  Cardiovascular: Normal rate, regular rhythm and normal heart sounds.   Pulmonary/Chest: Effort normal.  Abdominal: Soft. There is tenderness (RUQ). There is no rebound and no guarding.  Musculoskeletal: Normal range of motion.  Neurological: She is alert and oriented to person, place, and time.    Data Reviewed CT scan reveals contracted gallbladder with multiple stones LFTs within normal limits  Assessment    32-year-old female With symptomatic cholelithiasis     Plan    1. We'll proceed to the operating room for laparoscopic cholecystectomy with no I. OEC 2. All risks and benefits were discussed with the patient to generally include: infection, bleeding, possible need for post op ERCP, damage to the bile ducts, and bile leak. Alternatives were offered and described.  All questions were answered and the patient voiced understanding of the procedure and wishes to proceed at this point with a laparoscopic cholecystectomy         Davionte Lusby Jr., Terrionna Bridwell 10/06/2012, 1:49 PM    

## 2012-10-17 NOTE — Anesthesia Preprocedure Evaluation (Addendum)
Anesthesia Evaluation  Patient identified by MRN, date of birth, ID band Patient awake    Reviewed: Allergy & Precautions, H&P , NPO status , Patient's Chart, lab work & pertinent test results, reviewed documented beta blocker date and time   Airway Mallampati: II TM Distance: <3 FB Neck ROM: Full    Dental no notable dental hx. (+) Teeth Intact and Dental Advisory Given   Pulmonary Current Smoker,  breath sounds clear to auscultation  Pulmonary exam normal       Cardiovascular negative cardio ROS  Rhythm:Regular Rate:Normal     Neuro/Psych  Headaches, PSYCHIATRIC DISORDERS    GI/Hepatic negative GI ROS, Neg liver ROS,   Endo/Other  negative endocrine ROS  Renal/GU negative Renal ROS  negative genitourinary   Musculoskeletal   Abdominal   Peds  Hematology negative hematology ROS (+)   Anesthesia Other Findings Receding chin  Reproductive/Obstetrics negative OB ROS                        Anesthesia Physical Anesthesia Plan  ASA: II  Anesthesia Plan: General   Post-op Pain Management:    Induction: Intravenous  Airway Management Planned: Oral ETT  Additional Equipment:   Intra-op Plan:   Post-operative Plan: Extubation in OR  Informed Consent: I have reviewed the patients History and Physical, chart, labs and discussed the procedure including the risks, benefits and alternatives for the proposed anesthesia with the patient or authorized representative who has indicated his/her understanding and acceptance.   Dental advisory given  Plan Discussed with: CRNA  Anesthesia Plan Comments:         Anesthesia Quick Evaluation

## 2012-10-17 NOTE — Op Note (Signed)
Pre Operative Diagnosis:  Symptomatic gallstone  Post Operative Diagnosis: same  Procedure: Lap chole  Surgeon: Dr. Axel Filler  Assistant: none  Anesthesia: GETA  EBL: 5cc  Complications: none  Counts: reported as correct x 2  Findings:  Chronically inflammed gallbladder  Indications for procedure:  Pt is a 32 y/o F with a h/o of RUQ pain.  She was seen to have gallstones on [place] and decided to have her gallbladder electively removed.  Details of the procedure: The patient was taken to the operating room and placed in the supine position with bilateral SCDs in place. A time out was called and all facts were verified. A pneumoperitoneum was obtained via A Veress needle technique to a pressure of 14mm of mercury. A 5mm trochar was then placed in the right upper quadrant under visualization, and there were no injuries to any abdominal organs. A 11 mm port was then placed in the umbilical region after infiltrating with local anesthesia under direct visualization. A second epigastric port was placed under direct visualization. The gallbladder was identified and retracted, the peritoneum was then sharply dissected from the gallbladder and this dissection was carried down to Calot's triangle. The cystic duct was identified and stripped away circumferentially and seen going into the gallbladder 360.   2 clips were placed proximally one distally and the cystic duct transected. The cystic artery was identified and 2 clips placed proximally and one distally and transected.  We then proceeded to remove the gallbladder off the hepatic fossa with Bovie cautery. A latex retrieval bag was then placed in the abdomen and gallbladder placed in the bag. The hepatic fossa was then reexamined and hemostasis was achieved with laparoscopic specula and Bovie cautery and was excellent at this portion of the case. The subhepatic fossa and perihepatic fossa was then irrigated until the effluent was clear. The 11 mm  trocar fascia was reapproximated with the Endo Close #1 Vicryl x2.  The pneumoperitoneum was evacuated and all trochars removed under direct visulalization.  The skin was then closed with 4-0 Monocryl and the skin dressed with Steri-Strips, gauze, and tape.  The patient was awaken from general anesthesia and taken to the recovery room in stable condition.

## 2012-10-17 NOTE — Anesthesia Postprocedure Evaluation (Signed)
  Anesthesia Post-op Note  Patient: Casey Sweeney  Procedure(s) Performed: Procedure(s): LAPAROSCOPIC CHOLECYSTECTOMY (N/A)  Patient Location: PACU  Anesthesia Type:General  Level of Consciousness: awake  Airway and Oxygen Therapy: Patient Spontanous Breathing  Post-op Pain: mild  Post-op Assessment: Post-op Vital signs reviewed, Patient's Cardiovascular Status Stable, Respiratory Function Stable and Patent Airway  Post-op Vital Signs: Reviewed and stable  Complications: No apparent anesthesia complications

## 2012-10-18 ENCOUNTER — Emergency Department (HOSPITAL_COMMUNITY)
Admission: EM | Admit: 2012-10-18 | Discharge: 2012-10-18 | Disposition: A | Discharge status: Home / Self Care | Payer: BC Managed Care – PPO | Attending: Emergency Medicine | Admitting: Emergency Medicine

## 2012-10-18 ENCOUNTER — Encounter (HOSPITAL_COMMUNITY): Payer: Self-pay | Admitting: Emergency Medicine

## 2012-10-18 DIAGNOSIS — F17200 Nicotine dependence, unspecified, uncomplicated: Secondary | ICD-10-CM | POA: Insufficient documentation

## 2012-10-18 DIAGNOSIS — F3289 Other specified depressive episodes: Secondary | ICD-10-CM | POA: Insufficient documentation

## 2012-10-18 DIAGNOSIS — F411 Generalized anxiety disorder: Secondary | ICD-10-CM | POA: Insufficient documentation

## 2012-10-18 DIAGNOSIS — Z87442 Personal history of urinary calculi: Secondary | ICD-10-CM | POA: Insufficient documentation

## 2012-10-18 DIAGNOSIS — Z79899 Other long term (current) drug therapy: Secondary | ICD-10-CM | POA: Insufficient documentation

## 2012-10-18 DIAGNOSIS — Z87440 Personal history of urinary (tract) infections: Secondary | ICD-10-CM | POA: Insufficient documentation

## 2012-10-18 DIAGNOSIS — R339 Retention of urine, unspecified: Secondary | ICD-10-CM | POA: Insufficient documentation

## 2012-10-18 DIAGNOSIS — Z8719 Personal history of other diseases of the digestive system: Secondary | ICD-10-CM | POA: Insufficient documentation

## 2012-10-18 DIAGNOSIS — Z791 Long term (current) use of non-steroidal anti-inflammatories (NSAID): Secondary | ICD-10-CM | POA: Insufficient documentation

## 2012-10-18 DIAGNOSIS — F329 Major depressive disorder, single episode, unspecified: Secondary | ICD-10-CM | POA: Insufficient documentation

## 2012-10-18 DIAGNOSIS — R338 Other retention of urine: Secondary | ICD-10-CM

## 2012-10-18 DIAGNOSIS — G43909 Migraine, unspecified, not intractable, without status migrainosus: Secondary | ICD-10-CM | POA: Insufficient documentation

## 2012-10-18 LAB — BASIC METABOLIC PANEL
BUN: 7 mg/dL (ref 6–23)
Calcium: 9 mg/dL (ref 8.4–10.5)
Creatinine, Ser: 0.78 mg/dL (ref 0.50–1.10)
GFR calc Af Amer: >90 mL/min (ref >90)
GFR calc non Af Amer: >90 mL/min (ref >90)
Glucose, Bld: 109 mg/dL — ABNORMAL HIGH (ref 70–99)
Potassium: 3.5 mEq/L (ref 3.5–5.1)

## 2012-10-18 LAB — URINALYSIS, ROUTINE W REFLEX MICROSCOPIC
Bilirubin Urine: NEGATIVE
Glucose, UA: NEGATIVE mg/dL
Ketones, ur: NEGATIVE mg/dL
Protein, ur: NEGATIVE mg/dL
pH: 7 (ref 5.0–8.0)

## 2012-10-18 LAB — CBC
HCT: 39.4 % (ref 36.0–46.0)
Hemoglobin: 13.4 g/dL (ref 12.0–15.0)
MCH: 31.5 pg (ref 26.0–34.0)
MCHC: 34 g/dL (ref 30.0–36.0)
MCV: 92.5 fL (ref 78.0–100.0)
RDW: 12.7 % (ref 11.5–15.5)

## 2012-10-18 MED ORDER — HYDROMORPHONE HCL PF 1 MG/ML IJ SOLN
1.0000 mg | Freq: Once | INTRAMUSCULAR | Status: DC
  Filled 2012-10-18: qty 1

## 2012-10-18 NOTE — ED Provider Notes (Signed)
Medical screening examination/treatment/procedure(s) were performed by non-physician practitioner and as supervising physician I was immediately available for consultation/collaboration.   David Masneri, MD 10/18/12 2155 

## 2012-10-18 NOTE — ED Notes (Signed)
Pt verbalizes understanding of catheter care

## 2012-10-18 NOTE — ED Provider Notes (Signed)
CSN: [id number]     Arrival date & time 10/18/12  1128 History     First MD Initiated Contact with Patient 10/18/12 1142     Chief Complaint  Patient presents with  . Urinary Retention   (Consider location/radiation/quality/duration/timing/severity/associated sxs/prior Treatment) HPI  Casey Sweeney is a(n) 32 y.o. female who presents to the emergency department with chief complaint of acute urinary retention.  Patient is status post cholecystectomy times one day.  She states that she has urgency to urinate but can only void very small amounts at a time.  She states she has lower abdominal pain and discomfort.  She denies fever or flank pain.  She does have a history of UTIs.  She complains of some abdominal pain postop but states that it is minimal she has been up and walking around.  He has some intermittent nausea after taking her pain medications.  Patient has been able to pass gas and is eating  soft foods at home. Denies fevers, chills, myalgias, arthralgias. Denies DOE, SOB, chest tightness or pressure, radiation to left arm, jaw or back, or diaphoresis. Denies. Denies headaches, light headedness, weakness, visual disturbances. Denies abdominal pain, nausea, vomiting, diarrhea or constipation.    Past Medical History  Diagnosis Date  . Anxiety   . Depression   . Irritable bowel syndrome   . Kidney stones   . History of recurrent UTIs   . Headache(784.0)     migraines   Past Surgical History  Procedure Laterality Date  . No past surgeries     Family History  Problem Relation Age of Onset  . Cancer Other 80    unknown cancer  . Cancer Maternal Grandfather     unknown  . Hypertension Mother   . Cancer Paternal Aunt     breast   History  Substance Use Topics  . Smoking status: Current Every Day Smoker -- 1.00 packs/day for 10 years    Types: Cigarettes  . Smokeless tobacco: Never Used  . Alcohol Use: No   OB History   Grav Para Term Preterm Abortions TAB SAB Ect  Mult Living   5 4 3 1      4      Review of Systems Ten systems reviewed and are negative for acute change, except as noted in the HPI.   Allergies  Review of patient's allergies indicates no known allergies.  Home Medications   Current Outpatient Rx  Name  Route  Sig  Dispense  Refill  . meloxicam (MOBIC) 15 MG tablet   Oral   Take 15 mg by mouth daily.         Marland Kitchen oxyCODONE-acetaminophen (ROXICET) 5-325 MG per tablet   Oral   Take 1 tablet by mouth every 4 (four) hours as needed for pain.   30 tablet   0   . pseudoephedrine (SUDAFED) 30 MG tablet   Oral   Take 60 mg by mouth at bedtime as needed for congestion.         . sertraline (ZOLOFT) 25 MG tablet   Oral   Take 25 mg by mouth daily.          BP 118/67  Pulse 86  Temp(Src) 97.1 F (36.2 C) (Oral)  Resp 20  SpO2 98% Physical Exam Physical Exam  Nursing note and vitals reviewed. Constitutional: She is oriented to person, place, and time. She appears well-developed and well-nourished. No distress.  HENT:  Head: Normocephalic and atraumatic.  Eyes: Conjunctivae normal  and EOM are normal. Pupils are equal, round, and reactive to light. No scleral icterus.  Neck: Normal range of motion.  Cardiovascular: Normal rate, regular rhythm and normal heart sounds.  Exam reveals no gallop and no friction rub.   No murmur heard. Pulmonary/Chest: Effort normal and breath sounds normal. No respiratory distress.  Abdominal: Soft.  Quiet abdomen with minimal bowel sounds.  Surgical incisions with hypOFIX bandaging is in place.  No signs of serious or purulent discharge from the wound sites.  The patient was suprapubic distention and tenderness to palpation consistent with urinary retention and bladder distention. Neurological: She is alert and oriented to person, place, and time.  Skin: Skin is warm and dry. She is not diaphoretic.    ED Course   Procedures (including critical care time)  Labs Reviewed  URINALYSIS,  ROUTINE W REFLEX MICROSCOPIC - Abnormal; Notable for the following:    APPearance CLOUDY (*)    All other components within normal limits  CBC - Abnormal; Notable for the following:    Platelets 127 (*)    All other components within normal limits  BASIC METABOLIC PANEL - Abnormal; Notable for the following:    Glucose, Bld 109 (*)    All other components within normal limits   No results found. 1. Acute urinary retention     MDM  2:17 PM BP 118/67  Pulse 86  Temp(Src) 97.1 F (36.2 C) (Oral)  Resp 20  SpO2 98% Patient with concern for urinary retention.  Her scan showed 700 cc of fluid.  Foley catheter placed and patient had 1100 cc output of urine.  Patient acute urinary retention likely due to 2 medications administered during surgery.  She is 85 afebrile in the urine did not appear to be infected.  Labs show minimal elevation in glucose.  The patient will be sent home with Foley catheter and leg bag.  She should follow up with her PCP or her surgeon in postop visit on Monday for discontinuation of urinary catheter.      Arthor Captain, PA-C 10/18/12 1428

## 2012-10-18 NOTE — ED Notes (Signed)
Pt had gallbladder removed yesterday and now having urinary retention

## 2012-10-19 ENCOUNTER — Telehealth (INDEPENDENT_AMBULATORY_CARE_PROVIDER_SITE_OTHER): Payer: Self-pay | Admitting: General Surgery

## 2012-10-19 NOTE — Telephone Encounter (Signed)
She called stating she was voiding frequently and had been all night long.  She did not feel like she was emptying her bladder.  She had a laparoscopic cholecystectomy on 10/17/12.  I told her if she was not able to void well she would need to go to the emergency room and may need to be catheterized.

## 2012-10-20 ENCOUNTER — Ambulatory Visit (INDEPENDENT_AMBULATORY_CARE_PROVIDER_SITE_OTHER): Payer: BC Managed Care – PPO | Admitting: General Surgery

## 2012-10-20 ENCOUNTER — Telehealth (INDEPENDENT_AMBULATORY_CARE_PROVIDER_SITE_OTHER): Payer: Self-pay

## 2012-10-20 ENCOUNTER — Encounter (INDEPENDENT_AMBULATORY_CARE_PROVIDER_SITE_OTHER): Payer: Self-pay | Admitting: General Surgery

## 2012-10-20 VITALS — BP 122/82 | HR 106 | Temp 97.8°F | Resp 16 | Ht 68.0 in | Wt 144.0 lb

## 2012-10-20 DIAGNOSIS — Z466 Encounter for fitting and adjustment of urinary device: Secondary | ICD-10-CM

## 2012-10-20 NOTE — Telephone Encounter (Signed)
Pt called stating she was seen Saturday due to urinary retention and had foley placed at ER. Pt instructed by ER to call our office for removal of cath. Pt states urinary out put good with no blood. No bladder discomfort. Per Dr Derrell Lolling pt to come to office now to have foley removed. Pt advised if once foley removed she cannot void pt will need to see urology this afternoon for foley to be reinserted and managed. Pt states she understands and is on way to office.

## 2012-10-20 NOTE — Telephone Encounter (Signed)
Called to check on the patient after a message that I received and the patient stated that she was waiting on a call back from one of the ladies in triage who had paged AR to see about removing the cath..10/20/12@ 9:42

## 2012-10-20 NOTE — Progress Notes (Signed)
Patient comes in today S/P lap chole by AR on 10/17/12..patient was having some urinary retention over the weekend and was giving instructions from the on call doctor TR to go to the ED and have them insert foley cath which the patient did...patient then called in today 8/11 and stated that her urinary output was good with no blood in which Iona Hansen LPN paged AR who gave verbal orders for the patient to come in and have the cath removed, if after the cath was removed and she was still having problems voiding later on that p.m. She would need to call us back and see a urologist for re-insert of cath and them to manage...patient verbalized POC at this time and will call us back if she needs [place]...if not she was reminded of her follow up appt with AR on 9/5/@4 :15

## 2012-10-21 NOTE — Progress Notes (Signed)
Called to check on patient to see if she was voiding now without any problems in which she stated that she was, she did state that she was having to push to start voiding but other than that she if fine..per orders from AR this should get better in time but if not to call and let us know and we will go from there...patient verbalized agreement with POC and is pleased at this time...she will keep per follow up appt on 11/14/12

## 2012-11-14 ENCOUNTER — Encounter (INDEPENDENT_AMBULATORY_CARE_PROVIDER_SITE_OTHER): Payer: BC Managed Care – PPO | Admitting: General Surgery

## 2012-11-20 ENCOUNTER — Encounter (INDEPENDENT_AMBULATORY_CARE_PROVIDER_SITE_OTHER): Payer: BC Managed Care – PPO | Admitting: General Surgery

## 2012-11-27 ENCOUNTER — Encounter (INDEPENDENT_AMBULATORY_CARE_PROVIDER_SITE_OTHER): Payer: BC Managed Care – PPO | Admitting: General Surgery

## 2012-12-08 ENCOUNTER — Encounter (INDEPENDENT_AMBULATORY_CARE_PROVIDER_SITE_OTHER): Payer: Self-pay | Admitting: General Surgery

## 2013-02-11 ENCOUNTER — Other Ambulatory Visit: Payer: Self-pay | Admitting: Physician Assistant

## 2013-03-04 ENCOUNTER — Ambulatory Visit: Payer: BC Managed Care – PPO | Admitting: Obstetrics & Gynecology

## 2013-03-04 DIAGNOSIS — Z124 Encounter for screening for malignant neoplasm of cervix: Secondary | ICD-10-CM

## 2013-04-01 ENCOUNTER — Encounter: Payer: Self-pay | Admitting: Physician Assistant

## 2013-04-01 ENCOUNTER — Ambulatory Visit (INDEPENDENT_AMBULATORY_CARE_PROVIDER_SITE_OTHER): Payer: BC Managed Care – PPO

## 2013-04-01 ENCOUNTER — Ambulatory Visit (INDEPENDENT_AMBULATORY_CARE_PROVIDER_SITE_OTHER): Payer: BC Managed Care – PPO | Admitting: Physician Assistant

## 2013-04-01 VITALS — BP 113/75 | HR 104 | Wt 148.0 lb

## 2013-04-01 DIAGNOSIS — M25511 Pain in right shoulder: Secondary | ICD-10-CM

## 2013-04-01 DIAGNOSIS — M79601 Pain in right arm: Secondary | ICD-10-CM

## 2013-04-01 DIAGNOSIS — M25519 Pain in unspecified shoulder: Secondary | ICD-10-CM

## 2013-04-01 DIAGNOSIS — M79609 Pain in unspecified limb: Secondary | ICD-10-CM

## 2013-04-01 MED ORDER — TRAMADOL HCL 50 MG PO TABS: 50.0000 mg | ORAL_TABLET | Freq: Four times a day (QID) | ORAL | Status: DC | PRN

## 2013-04-01 MED ORDER — MELOXICAM 15 MG PO TABS: 15.0000 mg | ORAL_TABLET | Freq: Every day | ORAL | Status: DC

## 2013-04-01 NOTE — Patient Instructions (Signed)

## 2013-04-01 NOTE — Progress Notes (Signed)
   Subjective:    Patient ID: Dorna MaiAmanda C Sebastiano, female    DOB: 11/18/1980, 33 y.o.   MRN: [id number]  HPI Pt is a 33 yo female who presents to the clinic with right shoulder and arm pain for about 1 week. Pain started after moving a heavy piece of solid wood furniture. It worsened after helping her boss lift a 500lb piece of machinery. Whole right arm feels very achy and tender to touch down her arm. Currently she cannot even pick up a gallon of milk without pain. No numbness and tingling except when smoking her cigarette. She feels like her right fingers go numb before finishing. She has not tried anything for it. Lifting anything makes worse.    Review of Systems     Objective:   Physical Exam  Musculoskeletal:  Right shoulder- Normal ROM without pain. Strength 5/5 but reports discomfort. Very tender to touch around coracoid process and acromion. Tender with palpation down triceps and biceps. No pain with palpation of right elbow or right wrist. Hand grip normal. Dull and sharp sensations intact. ROM is full and without pain at elbow and wrist. Empty can/Neer's/Hawkins negative.           Assessment & Plan:  Right shoulder pain/arm pain- xray of right shoulder was negative for any acute fracture or dislocation. Discussed with pt likely muscular strain. Gave mobic to take daily. Discussed rest and NO heavy lifting. Tramadol given for acute pain because she has to work this weekend for the [employer] competition. Gave shoulder exercises to start after a couple of days of rest. Ice areas of discomfort. Follow up if symptoms worsening or 2-3 weeks.

## 2013-04-13 ENCOUNTER — Telehealth: Payer: Self-pay | Admitting: *Deleted

## 2013-04-13 NOTE — Telephone Encounter (Signed)
She could come in and try a subacromion injection. I would like to see if she has lost any strength since last visit. If stil not improving need to get MRI.

## 2013-04-13 NOTE — Telephone Encounter (Signed)
Pt left message stating that she is still having a lot of pain in her shoulder & the tramadol you gave her isn't really helping at all.  She wants to know what the next step is. Please advise.

## 2013-04-14 ENCOUNTER — Encounter: Payer: Self-pay | Admitting: Family Medicine

## 2013-04-14 ENCOUNTER — Ambulatory Visit (INDEPENDENT_AMBULATORY_CARE_PROVIDER_SITE_OTHER): Payer: BC Managed Care – PPO | Admitting: Family Medicine

## 2013-04-14 VITALS — BP 123/67 | HR 78 | Wt 154.0 lb

## 2013-04-14 DIAGNOSIS — M67919 Unspecified disorder of synovium and tendon, unspecified shoulder: Secondary | ICD-10-CM

## 2013-04-14 DIAGNOSIS — M7581 Other shoulder lesions, right shoulder: Secondary | ICD-10-CM

## 2013-04-14 DIAGNOSIS — M719 Bursopathy, unspecified: Secondary | ICD-10-CM

## 2013-04-14 NOTE — Telephone Encounter (Signed)
Spoke with pt & she is ok with injection.  She is off work today so she wanted Hommel to do it.  She's scheduled for 10:30 this morning.

## 2013-04-14 NOTE — Progress Notes (Signed)
CC: Casey Sweeney is a 33 y.o. female is here for right shoulder pain   Subjective: HPI:  Right shoulder pain for the past 2-3 weeks occurred late in the day after moving a 200-300 pound chest of drawers off a truck by herself.  Described as soreness deep in the shoulder radiating down right lateral arm ending near the lateral elbow.  Occasional numbness in 4th and 5th right digit.  Pain is worse with abduction beyond about 60 deg or with lifting anything heavier than a bathtowel.  Symptoms worse at the end of the day. No improvement with tramadol and mobic.  Pain moderate in severity, improves with keeping arm to her side.  Denies neck pain or weakness in extremities. Denies redness, swelling, warmth of right shoulder.  Denies chest pain, sob, nor any skin changes.   Review Of Systems Outlined In HPI  Past Medical History  Diagnosis Date  . Anxiety   . Depression   . Irritable bowel syndrome   . Kidney stones   . History of recurrent UTIs   . Headache(784.0)     migraines     Family History  Problem Relation Age of Onset  . Cancer Other 80    unknown cancer  . Cancer Maternal Grandfather     unknown  . Hypertension Mother   . Cancer Paternal Aunt     breast     History  Substance Use Topics  . Smoking status: Current Every Day Smoker -- 1.00 packs/day for 10 years    Types: Cigarettes  . Smokeless tobacco: Never Used  . Alcohol Use: No     Objective: Filed Vitals:   04/14/13 1037  BP: 123/67  Pulse: 78    General: Alert and Oriented, No Acute Distress HEENT: Pupils equal, round, reactive to light. Conjunctivae clear.  Moist mucous membranes Lungs: Clear and comfortable breathing Cardiac: Regular rate and rhythm.  Right shoulder exam reveals full range of motion and strength in all planes of motion and with individual rotator cuff testing. No overlying redness warmth or swelling.  Neer's test negative.  Hawkins test positive. Speed's negative. Empty can positive.  Crossarm test negative. O'Brien's test negative. Apprehension test negative.  Mental Status: No depression, anxiety, nor agitation. Skin: Warm and dry.  Assessment & Plan: Casey Sweeney was seen today for right shoulder pain.  Diagnoses and associated orders for this visit:  Right rotator cuff tendinitis    Exam most consistent with supraspinatous tendinitis, she expresses interest in steroid injection.  Reviewed shoulder films from last visit which are unremarkable.  Can't fully explain parasthesia in 4th&5th digits which may reflect cervical radiculopathy if CC not improved with injection below.  If not improved by later this week will refer to Dr. Karie Schwalbe in sports medicine.  Return if symptoms worsen or fail to improve.  Subacromial Shoulder Injection Procedure Note  Pre-operative Diagnosis: right RTC tendinitis  Post-operative Diagnosis: same  Indications: symptom relief  Anesthesia: topical cold spray  Procedure Details   Verbal consent was obtained for the procedure. The shoulder was prepped with alcoholand the skin was anesthetized. A 27 gauge needle was advanced into the subacromial space through posterior approach without difficulty  The space was then injected with 2 ml 1% lidocaine and 2ml of triamcinolone (KENALOG) 40mg /ml. The injection site was cleansed with isopropyl alcohol and a dressing was applied.  Complications:  None; patient tolerated the procedure well.

## 2013-07-23 ENCOUNTER — Encounter: Payer: Self-pay | Admitting: Family Medicine

## 2013-07-23 ENCOUNTER — Ambulatory Visit (INDEPENDENT_AMBULATORY_CARE_PROVIDER_SITE_OTHER): Payer: BC Managed Care – PPO | Admitting: Family Medicine

## 2013-07-23 VITALS — BP 131/85 | HR 97 | Temp 97.6°F | Wt 152.0 lb

## 2013-07-23 DIAGNOSIS — N390 Urinary tract infection, site not specified: Secondary | ICD-10-CM

## 2013-07-23 DIAGNOSIS — R197 Diarrhea, unspecified: Secondary | ICD-10-CM

## 2013-07-23 DIAGNOSIS — R109 Unspecified abdominal pain: Secondary | ICD-10-CM

## 2013-07-23 LAB — POCT URINALYSIS DIPSTICK
Bilirubin, UA: NEGATIVE
GLUCOSE UA: NEGATIVE
Leukocytes, UA: NEGATIVE
NITRITE UA: NEGATIVE
Protein, UA: NEGATIVE
Spec Grav, UA: >=1.03
UROBILINOGEN UA: 0.2
pH, UA: 6

## 2013-07-23 MED ORDER — CHOLESTYRAMINE 4 G PO PACK: 4.0000 g | PACK | Freq: Three times a day (TID) | ORAL | Status: DC

## 2013-07-23 MED ORDER — CIPROFLOXACIN HCL 250 MG PO TABS: ORAL_TABLET | ORAL | Status: DC

## 2013-07-23 NOTE — Progress Notes (Signed)
CC: Casey Sweeney is a 33 y.o. female is here for abdominal discomfort   Subjective: HPI:  Patient complains of abdominal pain described as mild and diffuse, grumbling, present all hours of the day but not interfering with sleep. Pain has been present for the past week. Has been accompanied by increased diarrhea which was present ever since her cholecystectomy however occurring much more frequently over the past week. Described as watery with dense particles. Denies blood in stool nor melena appearance. She is not awakening from urge to defecate. Accompanied by fatigue, flank pain. No interventions as of yet. Pain is nonradiating. Denies fevers, chills, nausea, vomiting, decreased appetite, nor dysuria   Review Of Systems Outlined In HPI  Past Medical History  Diagnosis Date  . Anxiety   . Depression   . Irritable bowel syndrome   . Kidney stones   . History of recurrent UTIs   . Headache(784.0)     migraines    Past Surgical History  Procedure Laterality Date  . No past surgeries    . Cholecystectomy  10/17/12    lap chole  . Cholecystectomy N/A 10/17/2012    Procedure: LAPAROSCOPIC CHOLECYSTECTOMY;  Surgeon: Axel FillerArmando Ramirez, MD;  [facility];  Service: General;  Laterality: N/A;   Family History  Problem Relation Age of Onset  . Cancer Other 80    unknown cancer  . Cancer Maternal Grandfather     unknown  . Hypertension Mother   . Cancer Paternal Aunt     breast    History   Social History  . Marital Status: Married    Spouse Name: N/A    Number of Children: N/A  . Years of Education: N/A   Occupational History  . unemployed    Social History Main Topics  . Smoking status: Current Every Day Smoker -- 1.00 packs/day for 10 years    Types: Cigarettes  . Smokeless tobacco: Never Used  . Alcohol Use: No  . Drug Use: No  . Sexual Activity: Yes    Partners: Male    Birth Control/ Protection: IUD   Other Topics Concern  . Not on file   Social History  Narrative  . No narrative on file     Objective: BP 131/85  Pulse 97  Temp(Src) 97.6 F (36.4 C) (Oral)  Wt 152 lb (68.947 kg)  General: Alert and Oriented, No Acute Distress HEENT: Pupils equal, round, reactive to light. Conjunctivae clear.  Moist mucous membranes pharynx unremarkable Lungs: Clear to auscultation bilaterally, no wheezing/ronchi/rales.  Comfortable work of breathing. Good air movement. Cardiac: Regular rate and rhythm. Normal S1/S2.  No murmurs, rubs, nor gallops.   Abdomen: Normal bowel sounds, soft and non tender without palpable masses. No rebound or guarding Back: Mild bilateral CVA tenderness Extremities: No peripheral edema.  Strong peripheral pulses.  Mental Status: No depression, anxiety, nor agitation. Skin: Warm and dry.  Assessment & Plan: Casey Sweeney was seen today for abdominal discomfort.  Diagnoses and associated orders for this visit:  Abdominal pain, unspecified site - Urinalysis Dipstick  Diarrhea - cholestyramine (QUESTRAN) 4 G packet; Take 1 packet (4 g total) by mouth 3 (three) times daily with meals.  UTI (urinary tract infection) - ciprofloxacin (CIPRO) 250 MG tablet; Take one by mouth twice a day for five days.    Diarrhea: Start questran to eliminate any possibility of bile acids tissue being to her chronic diarrhea since cholecystectomy. Hopefully this will help with some component of her abdominal pain. Discussed  oral rehydration to replace lost fluids.  Abdominal pain: Urinalysis with blood suspicious for UTI therefore start ciprofloxacin pending urine culture.   Return if symptoms worsen or fail to improve.

## 2013-07-23 NOTE — Addendum Note (Signed)
Addended by: Wyline BeadyMCCRIMMON, ANDREA C on: 07/23/2013 02:03 PM   Modules accepted: Orders

## 2013-07-25 LAB — URINE CULTURE
Colony Count: NO GROWTH
ORGANISM ID, BACTERIA: NO GROWTH

## 2013-07-28 ENCOUNTER — Ambulatory Visit (INDEPENDENT_AMBULATORY_CARE_PROVIDER_SITE_OTHER): Payer: BC Managed Care – PPO | Admitting: Sports Medicine

## 2013-07-28 ENCOUNTER — Other Ambulatory Visit: Payer: Self-pay | Admitting: Physician Assistant

## 2013-07-28 ENCOUNTER — Encounter: Payer: Self-pay | Admitting: Sports Medicine

## 2013-07-28 VITALS — BP 120/75 | HR 90 | Ht 68.0 in | Wt 150.0 lb

## 2013-07-28 DIAGNOSIS — G5620 Lesion of ulnar nerve, unspecified upper limb: Secondary | ICD-10-CM

## 2013-07-28 DIAGNOSIS — M25519 Pain in unspecified shoulder: Secondary | ICD-10-CM

## 2013-07-28 DIAGNOSIS — M25511 Pain in right shoulder: Secondary | ICD-10-CM

## 2013-07-28 DIAGNOSIS — G8929 Other chronic pain: Secondary | ICD-10-CM | POA: Insufficient documentation

## 2013-07-28 DIAGNOSIS — L600 Ingrowing nail: Secondary | ICD-10-CM | POA: Insufficient documentation

## 2013-07-28 DIAGNOSIS — G5621 Lesion of ulnar nerve, right upper limb: Secondary | ICD-10-CM | POA: Insufficient documentation

## 2013-07-28 MED ORDER — DOXYCYCLINE HYCLATE 100 MG PO TABS: 100.0000 mg | ORAL_TABLET | Freq: Two times a day (BID) | ORAL | Status: AC

## 2013-07-28 MED ORDER — LEVOCETIRIZINE DIHYDROCHLORIDE 5 MG PO TABS: 5.0000 mg | ORAL_TABLET | Freq: Every evening | ORAL | Status: DC

## 2013-07-28 NOTE — Assessment & Plan Note (Signed)
She will wear an elbow sleeve/nighttime splint to prevent flexion. I can see her back in a month, we can consider an ulnar nerve hydrodissection in the cubital tunnel if no better.

## 2013-07-28 NOTE — Assessment & Plan Note (Signed)
Symptoms are referable to both the rotator cuff, and shoulder labrum. Glenohumeral and subacromial injections under guidance. Physical therapy. Return in one month, MRI if no better.

## 2013-07-28 NOTE — Progress Notes (Signed)
Would like to try something other than OTC.

## 2013-07-28 NOTE — Assessment & Plan Note (Signed)
With a low-grade paronychia. 7 days of doxycycline, return to see me if no better, and we can perform a medial nail plate excision

## 2013-07-28 NOTE — Progress Notes (Signed)
Subjective:    I'm seeing this patient as a consultation for: Dr. Ivan AnchorsHommel   CC: Right shoulder, left great toe pain  HPI: Right shoulder pain:  Present for several months now, had a subacromial injection back in February that provided some temporary relief. Pain is localized over the deltoid and posterior joint line, occasional catching but really worse with overhead activities without radiation.  Right fourth and fifth finger numbness: Present in the morning and when she sleeps with her arms bent at night.  Left toe pain: Great toe, medial nail plate, somewhat swollen, painful. Present for several weeks.  Past medical history, Surgical history, Family history not pertinant except as noted below, Social history, Allergies, and medications have been entered into the medical record, reviewed, and no changes needed.   Review of Systems: No headache, visual changes, nausea, vomiting, diarrhea, constipation, dizziness, abdominal pain, skin rash, fevers, chills, night sweats, weight loss, swollen lymph nodes, body aches, joint swelling, muscle aches, chest pain, shortness of breath, mood changes, visual or auditory hallucinations.   Objective:   General: Well Developed, well nourished, and in no acute distress.  Neuro/Psych: Alert and oriented x3, extra-ocular muscles intact, able to move all 4 extremities, sensation grossly intact. Skin: Warm and dry, no rashes noted.  Respiratory: Not using accessory muscles, speaking in full sentences, trachea midline.  Cardiovascular: Pulses palpable, no extremity edema. Abdomen: Does not appear distended. Right Shoulder: Inspection reveals no abnormalities, atrophy or asymmetry. Palpation is normal with no tenderness over AC joint or bicipital groove. ROM is full in all planes. Rotator cuff strength normal throughout. Positive Neer and Hawkin's tests, empty can sign. Speeds and Yergason's tests normal. Positive Obrien's, positive crank test, negative  clunk and good stability. Normal scapular function observed. No painful arc and no drop arm sign. No apprehension sign Left foot: Tender to palpation along the lateral nail fold. She does have an ingrown toenail along the lateral plate. Right Elbow: Unremarkable to inspection. Range of motion full pronation, supination, flexion, extension. Strength is full to all of the above directions Stable to varus, valgus stress. Negative moving valgus stress test. No discrete areas of tenderness to palpation. Ulnar nerve does not sublux. Positive cubital tunnel Tinel's.  Procedure: Real-time Ultrasound Guided Injection of right subacromial bursa  Device: GE Logiq E  Verbal informed consent obtained.  Time-out conducted.  Noted no overlying erythema, induration, or other signs of local infection.  Skin prepped in a sterile fashion.  Local anesthesia: Topical Ethyl chloride.  With sterile technique and under real time ultrasound guidance:  1 cc Kenalog 40, 4 cc lidocaine injected easily into the subacromial bursa, the entire rotator cuff was intact.  Completed without difficulty  Pain immediately resolved suggesting accurate placement of the medication.  Advised to call if fevers/chills, erythema, induration, drainage, or persistent bleeding.  Images permanently stored and available for review in the ultrasound unit.  Impression: Technically successful ultrasound guided injection.  Procedure: Real-time Ultrasound Guided Injection of right glenohumeral joint Device: GE Logiq E  Verbal informed consent obtained.  Time-out conducted.  Noted no overlying erythema, induration, or other signs of local infection.  Skin prepped in a sterile fashion.  Local anesthesia: Topical Ethyl chloride.  With sterile technique and under real time ultrasound guidance:  Spinal needle advanced into the joint, 1 cc Kenalog 40, 4 cc lidocaine injected easily. Completed without difficulty  Pain immediately resolved  suggesting accurate placement of the medication.  Advised to call if fevers/chills, erythema, induration,  drainage, or persistent bleeding.  Images permanently stored and available for review in the ultrasound unit.  Impression: Technically successful ultrasound guided injection.  Impression and Recommendations:   This case required medical decision making of moderate complexity.

## 2013-08-04 ENCOUNTER — Ambulatory Visit (INDEPENDENT_AMBULATORY_CARE_PROVIDER_SITE_OTHER): Payer: BC Managed Care – PPO | Admitting: Sports Medicine

## 2013-08-04 ENCOUNTER — Encounter: Payer: Self-pay | Admitting: Sports Medicine

## 2013-08-04 VITALS — BP 121/76 | HR 92 | Ht 68.0 in | Wt 150.0 lb

## 2013-08-04 DIAGNOSIS — L600 Ingrowing nail: Secondary | ICD-10-CM

## 2013-08-04 MED ORDER — HYDROCODONE-ACETAMINOPHEN 5-325 MG PO TABS: 1.0000 | ORAL_TABLET | Freq: Three times a day (TID) | ORAL | Status: DC | PRN

## 2013-08-04 NOTE — Patient Instructions (Signed)
Toenail Removal   Toenails may need to be removed because of injury, infections, or to correct abnormal growth. A special non-stick bandage will likely be put tightly on your toe to prevent bleeding. Often times a new nail will grow back. Sometimes the new nail may be deformed. Most of the time when a nail is lost, it will gradually heal, but may be sensitive for a long time.   HOME CARE INSTRUCTIONS   Keep your foot elevated to relieve pain and swelling. This will require lying in bed or on a couch with the leg on pillows or sitting in a recliner with the leg up. Walking or letting your leg dangle may increase swelling, slow healing, and cause throbbing pain.   Keep your bandage dry and clean.   Change your bandage in 24 hours.   After your bandage is changed, soak your foot in warm, soapy water for 10 to 20 minutes. Do this 3 times per day. This helps reduce pain and swelling. After soaking your foot apply a clean, dry bandage. Change your bandage if it is wet or dirty.   Only take over-the-counter or prescription medicines for pain, discomfort, or fever as directed by your caregiver.   See your caregiver as needed for problems.  You might need a tetanus shot now if:   You have no idea when you had the last one.   You have never had a tetanus shot before.   The injured area had dirt in it.  If you need a tetanus shot, and you decide not to get one, there is a rare chance of getting tetanus. Sickness from tetanus can be serious. If you did get a tetanus shot, your arm may swell, get red and warm to the touch at the shot site. This is common and not a problem.   SEEK IMMEDIATE MEDICAL CARE IF:   You have increased pain, swelling, redness, warmth, drainage, or bleeding.   You have a fever.   You have swelling that spreads from your toe into your foot.  Document Released: 11/25/2002 Document Revised: 05/21/2011 Document Reviewed: 03/08/2008   Exit[employer]® Patient Information ©2014 [employer].

## 2013-08-04 NOTE — Progress Notes (Signed)
Procedure:  Removal of medial nail plate of the left great toenail. Risks, benefits, alternatives explained to patient. Consent obtained. Time out conducted. Noted no overlying induration or erythema at site of injection. Toe cleaned with alcohol, then a total of 10 cc lidocaine 2% infiltrated at adjacent webspaces at the location of the bifurcation of the common digital nerve to proper digital nerves.  Some lidocaine also infiltrated at hyponychium and under nail bed.  Adequate anesthesia ensured. Toe prepped and draped in a sterile fashion. Nail elevator used to separate nail plate from nail bed. Clippers used to cut toenail in a longitudinal fashion to proximal nail fold and matrix. Hemostat then used to separate nail fragment from surrounding structures. Nail bed and matrix treated. Minor bleeding controlled with pressure and silver nitrate. Antibiotic ointment applied. Toe dressed. Advised to return if increased redness, swelling, drainage, fevers, or chills.

## 2013-08-04 NOTE — Assessment & Plan Note (Signed)
Removal of medial nail plate of left great toenail. Hydrocodone for pain. Return in a week for wound check.

## 2013-08-11 ENCOUNTER — Ambulatory Visit (INDEPENDENT_AMBULATORY_CARE_PROVIDER_SITE_OTHER): Payer: BC Managed Care – PPO | Admitting: Sports Medicine

## 2013-08-11 ENCOUNTER — Encounter: Payer: Self-pay | Admitting: Sports Medicine

## 2013-08-11 VITALS — BP 121/83 | HR 85 | Ht 68.0 in | Wt 149.0 lb

## 2013-08-11 DIAGNOSIS — L600 Ingrowing nail: Secondary | ICD-10-CM

## 2013-08-11 NOTE — Assessment & Plan Note (Signed)
Healing extremely well, return as needed for this.

## 2013-08-11 NOTE — Progress Notes (Signed)
  Subjective: One week post excision of the medial nail plate of the left great toenail, doing extremely well.   Objective: General: Well-developed, well-nourished, and in no acute distress. Left great toenail: No signs of infection, healing well. Essentially pain-free.  Assessment/plan:

## 2013-08-23 ENCOUNTER — Other Ambulatory Visit: Payer: Self-pay | Admitting: Physician Assistant

## 2013-08-24 NOTE — Telephone Encounter (Signed)
Needs f/u appt before future refills 

## 2013-08-25 ENCOUNTER — Ambulatory Visit: Payer: BC Managed Care – PPO | Admitting: Sports Medicine

## 2013-08-25 DIAGNOSIS — Z0289 Encounter for other administrative examinations: Secondary | ICD-10-CM

## 2013-09-17 ENCOUNTER — Telehealth: Payer: Self-pay | Admitting: *Deleted

## 2013-09-17 NOTE — Telephone Encounter (Signed)
Pt left a vm stating that she is very tired all the time & has no energy.  She is wanting to know if she can have some blood work draw.  Can we just order the labs or would you like for her to come in? Please advise.

## 2013-09-18 NOTE — Telephone Encounter (Signed)
I'd recommend an office visit to address this.

## 2013-09-21 NOTE — Telephone Encounter (Signed)
Left detailed message on vm.

## 2013-09-25 ENCOUNTER — Telehealth: Payer: Self-pay | Admitting: *Deleted

## 2013-09-25 ENCOUNTER — Ambulatory Visit (INDEPENDENT_AMBULATORY_CARE_PROVIDER_SITE_OTHER): Payer: BC Managed Care – PPO | Admitting: Sports Medicine

## 2013-09-25 ENCOUNTER — Encounter: Payer: Self-pay | Admitting: Sports Medicine

## 2013-09-25 VITALS — BP 132/93 | HR 93 | Ht 68.0 in | Wt 151.0 lb

## 2013-09-25 DIAGNOSIS — M25511 Pain in right shoulder: Secondary | ICD-10-CM

## 2013-09-25 DIAGNOSIS — M25519 Pain in unspecified shoulder: Secondary | ICD-10-CM

## 2013-09-25 NOTE — Telephone Encounter (Signed)
MRI right shoulder [id number] - [facility] and [facility] [employer] ref [id number]. No prior auth required. Corliss SkainsJamie Aidden Markovic, CMA

## 2013-09-25 NOTE — Progress Notes (Signed)
  Subjective:    CC: Followup  HPI: Casey Sweeney returns with persistent pain and impingement type symptoms after 2 subacromial injections and the glenohumeral injection as well as physical therapy. Symptoms are moderate, persistent, localized over the deltoid and worse with overhead activities.  Past medical history, Surgical history, Family history not pertinant except as noted below, Social history, Allergies, and medications have been entered into the medical record, reviewed, and no changes needed.   Review of Systems: No fevers, chills, night sweats, weight loss, chest pain, or shortness of breath.   Objective:    General: Well Developed, well nourished, and in no acute distress.  Neuro: Alert and oriented x3, extra-ocular muscles intact, sensation grossly intact.  HEENT: Normocephalic, atraumatic, pupils equal round reactive to light, neck supple, no masses, no lymphadenopathy, thyroid nonpalpable.  Skin: Warm and dry, no rashes. Cardiac: Regular rate and rhythm, no murmurs rubs or gallops, no lower extremity edema.  Respiratory: Clear to auscultation bilaterally. Not using accessory muscles, speaking in full sentences. Right Shoulder: Inspection reveals no abnormalities, atrophy or asymmetry. Palpation is normal with no tenderness over AC joint or bicipital groove. ROM is full in all planes. Rotator cuff strength slightly weak to abduction. Positive Neer and Hawkin's tests, empty can. Speeds and Yergason's tests normal. No labral pathology noted with negative Obrien's, negative crank, negative clunk, and good stability. Normal scapular function observed. No painful arc and no drop arm sign. No apprehension sign  Impression and Recommendations:

## 2013-09-25 NOTE — Assessment & Plan Note (Signed)
Persistent pain referable mostly to the rotator cuff. She has failed to subacromial injections in the glenohumeral junction as well as rehabilitation, and analgesics. MRI, referral to Dr. Ave Filterhandler.

## 2013-10-04 ENCOUNTER — Other Ambulatory Visit: Payer: Self-pay | Admitting: Physician Assistant

## 2013-10-06 ENCOUNTER — Ambulatory Visit (INDEPENDENT_AMBULATORY_CARE_PROVIDER_SITE_OTHER): Payer: BC Managed Care – PPO | Admitting: Physician Assistant

## 2013-10-06 ENCOUNTER — Other Ambulatory Visit: Payer: Self-pay | Admitting: Physician Assistant

## 2013-10-06 ENCOUNTER — Encounter: Payer: Self-pay | Admitting: Physician Assistant

## 2013-10-06 VITALS — BP 118/74 | HR 88 | Ht 68.0 in | Wt 150.0 lb

## 2013-10-06 DIAGNOSIS — R5383 Other fatigue: Secondary | ICD-10-CM

## 2013-10-06 DIAGNOSIS — R5381 Other malaise: Secondary | ICD-10-CM

## 2013-10-06 DIAGNOSIS — F329 Major depressive disorder, single episode, unspecified: Secondary | ICD-10-CM

## 2013-10-06 DIAGNOSIS — F419 Anxiety disorder, unspecified: Secondary | ICD-10-CM

## 2013-10-06 DIAGNOSIS — M25511 Pain in right shoulder: Secondary | ICD-10-CM

## 2013-10-06 DIAGNOSIS — N644 Mastodynia: Secondary | ICD-10-CM | POA: Diagnosis not present

## 2013-10-06 DIAGNOSIS — F3289 Other specified depressive episodes: Secondary | ICD-10-CM

## 2013-10-06 DIAGNOSIS — M25519 Pain in unspecified shoulder: Secondary | ICD-10-CM | POA: Diagnosis not present

## 2013-10-06 DIAGNOSIS — F411 Generalized anxiety disorder: Secondary | ICD-10-CM

## 2013-10-06 DIAGNOSIS — Z803 Family history of malignant neoplasm of breast: Secondary | ICD-10-CM

## 2013-10-06 DIAGNOSIS — R195 Other fecal abnormalities: Secondary | ICD-10-CM | POA: Diagnosis not present

## 2013-10-06 DIAGNOSIS — F32A Depression, unspecified: Secondary | ICD-10-CM

## 2013-10-06 MED ORDER — SERTRALINE HCL 25 MG PO TABS: ORAL_TABLET | ORAL | Status: DC

## 2013-10-06 MED ORDER — COLESEVELAM HCL 625 MG PO TABS: 1875.0000 mg | ORAL_TABLET | Freq: Two times a day (BID) | ORAL | Status: DC

## 2013-10-06 MED ORDER — DICYCLOMINE HCL 10 MG PO CAPS: 10.0000 mg | ORAL_CAPSULE | Freq: Three times a day (TID) | ORAL | Status: DC

## 2013-10-07 LAB — TSH: TSH: 1.851 u[IU]/mL (ref 0.350–4.500)

## 2013-10-07 LAB — COMPLETE METABOLIC PANEL WITH GFR
ALK PHOS: 60 U/L (ref 39–117)
ALT: 16 U/L (ref 0–35)
AST: 15 U/L (ref 0–37)
Albumin: 4.3 g/dL (ref 3.5–5.2)
BILIRUBIN TOTAL: 0.5 mg/dL (ref 0.2–1.2)
BUN: 12 mg/dL (ref 6–23)
CO2: 25 mEq/L (ref 19–32)
CREATININE: 0.87 mg/dL (ref 0.50–1.10)
Calcium: 8.9 mg/dL (ref 8.4–10.5)
Chloride: 107 mEq/L (ref 96–112)
GFR, Est African American: >89 mL/min
Glucose, Bld: 84 mg/dL (ref 70–99)
Potassium: 4 mEq/L (ref 3.5–5.3)
Sodium: 141 mEq/L (ref 135–145)
Total Protein: 6.7 g/dL (ref 6.0–8.3)

## 2013-10-07 LAB — CBC WITH DIFFERENTIAL/PLATELET
BASOS ABS: 0.1 10*3/uL (ref 0.0–0.1)
BASOS PCT: 1 % (ref 0–1)
Eosinophils Absolute: 0.3 10*3/uL (ref 0.0–0.7)
Eosinophils Relative: 4 % (ref 0–5)
HCT: 44.1 % (ref 36.0–46.0)
HEMOGLOBIN: 14.8 g/dL (ref 12.0–15.0)
Lymphocytes Relative: 28 % (ref 12–46)
Lymphs Abs: 2.3 10*3/uL (ref 0.7–4.0)
MCH: 31 pg (ref 26.0–34.0)
MCHC: 33.6 g/dL (ref 30.0–36.0)
MCV: 92.3 fL (ref 78.0–100.0)
Monocytes Absolute: 0.5 10*3/uL (ref 0.1–1.0)
Monocytes Relative: 6 % (ref 3–12)
NEUTROS ABS: 4.9 10*3/uL (ref 1.7–7.7)
NEUTROS PCT: 61 % (ref 43–77)
PLATELETS: 218 10*3/uL (ref 150–400)
RBC: 4.78 MIL/uL (ref 3.87–5.11)
RDW: 13.5 % (ref 11.5–15.5)
WBC: 8.1 10*3/uL (ref 4.0–10.5)

## 2013-10-07 LAB — VITAMIN D 25 HYDROXY (VIT D DEFICIENCY, FRACTURES): Vit D, 25-Hydroxy: 39 ng/mL (ref 30–89)

## 2013-10-07 LAB — FERRITIN: FERRITIN: 47 ng/mL (ref 10–291)

## 2013-10-07 LAB — VITAMIN B12: Vitamin B-12: 392 pg/mL (ref 211–911)

## 2013-10-08 NOTE — Progress Notes (Signed)
   Subjective:    Patient ID: Casey Sweeney, female    DOB: 03/11/1981, 33 y.o.   MRN: [id number]  HPI Pt comes into clinic today with all 4 kids to completely well child checks. She has multiple complaints to address herself therefore we made this an office visit.  Patient has had some left sided breast tenderness. She is concerned because she had an aunt that had invasive breast cancer and died 6 months after diagnosis. She denies any wants or masses felt. She denies any fever, chills, nausea or vomiting. She has ongoing fatigue.  Patient is really concerned with her fatigue. She has no motivation or desire to do anything. She denies depression being the culprit. She would like labs tests for fatigue. Will refill zoloft for ongoing anxiety.   She continues to have right shoulder pain despite 2 injections from Dr.Thekkekendam. She has bought a home on shoulder stabilizer and wonders if she can try.   She continues to have loose stools since she had gallbladder removed. Dr. Ivan Anchorshommel put her on questran but she cannot tolerate the powder she would like to try something else. Bentyl does help with abdominal cramping needs refill.      Review of Systems  All other systems reviewed and are negative.      Objective:   Physical Exam  Constitutional: She is oriented to person, place, and time. She appears well-developed and well-nourished.  HENT:  Head: Normocephalic and atraumatic.  Cardiovascular: Normal rate, regular rhythm and normal heart sounds.   Pulmonary/Chest: Effort normal and breath sounds normal.  Breast exam was negative for any masses or lumps. Tenderness over left breast over LUQ.   Neurological: She is alert and oriented to person, place, and time.  Skin: Skin is dry.  Psychiatric: She has a normal mood and affect. Her behavior is normal.          Assessment & Plan:  Left breast tenderness- will get diagnostic mammogram. Reassured pt nothing palpated on exam today.    Fatigue- ordered labs to address. Follow up to discuss.   Loose stools- will try welchol since is pill form of something similar to [place]. Refilled bentyl.   Anxiety- refilled zoloft for 6 months.   Right shoulder pain- follow up with Dr. Karie Schwalbe. Continue mobic. Use shoulder stabilzer. May need to preceede with MRI.

## 2013-10-21 ENCOUNTER — Other Ambulatory Visit: Payer: Self-pay | Admitting: Physician Assistant

## 2013-10-21 DIAGNOSIS — Z803 Family history of malignant neoplasm of breast: Secondary | ICD-10-CM

## 2013-10-21 DIAGNOSIS — N644 Mastodynia: Secondary | ICD-10-CM

## 2013-11-27 ENCOUNTER — Other Ambulatory Visit: Payer: Self-pay | Admitting: Physician Assistant

## 2013-11-27 MED ORDER — AMOXICILLIN-POT CLAVULANATE 875-125 MG PO TABS: 1.0000 | ORAL_TABLET | Freq: Two times a day (BID) | ORAL | Status: DC

## 2013-11-27 NOTE — Progress Notes (Signed)
Pt comes in with daughter. Sinus pressure for 3 weeks. Ear pain. No fever, nausea. Will send over augmentin. Follow up if not improving.   The weekend and no appts in office.

## 2013-12-21 ENCOUNTER — Other Ambulatory Visit: Payer: Self-pay | Admitting: Physician Assistant

## 2013-12-25 ENCOUNTER — Ambulatory Visit (INDEPENDENT_AMBULATORY_CARE_PROVIDER_SITE_OTHER): Payer: BC Managed Care – PPO | Admitting: Sports Medicine

## 2013-12-25 ENCOUNTER — Telehealth: Payer: Self-pay | Admitting: *Deleted

## 2013-12-25 ENCOUNTER — Encounter: Payer: Self-pay | Admitting: Sports Medicine

## 2013-12-25 VITALS — BP 121/84 | HR 96 | Ht 68.0 in | Wt 152.0 lb

## 2013-12-25 DIAGNOSIS — M25511 Pain in right shoulder: Secondary | ICD-10-CM

## 2013-12-25 MED ORDER — TRAMADOL HCL 50 MG PO TABS: ORAL_TABLET | ORAL | Status: DC

## 2013-12-25 NOTE — Assessment & Plan Note (Addendum)
Persistent impingement type symptoms with some pain referable to the labrum. Unfortunately still has not seen Dr. Ave Filterhandler nor has she got an MRI. Repeat subacromial injection today, tramadol refill, this will be her last refill until I have an MRI in the chart. She will return for the MR gadolinium contrast injection.

## 2013-12-25 NOTE — Telephone Encounter (Signed)
Per online [employer] portal no auth required. Corliss SkainsJamie Mykeal Carrick, CMA

## 2013-12-25 NOTE — Progress Notes (Signed)
  Subjective:    CC:  Followup  HPI: Casey Sweeney returns, we had done a few injections, each of which has lasted one month. The most recent of which has been a glenohumeral, she has also had subacromial injections. Pain is over the anterior shoulder, worse with overhead activities. In the past I did recommend that she see Dr. Ave Filterhandler with shoulder surgery, and I ordered an MRI of her right shoulder. She has not seen Dr. Ave Filterhandler and she has not yet obtained an MRI. Symptoms are moderate, persistent.  Past medical history, Surgical history, Family history not pertinant except as noted below, Social history, Allergies, and medications have been entered into the medical record, reviewed, and no changes needed.   Review of Systems: No fevers, chills, night sweats, weight loss, chest pain, or shortness of breath.   Objective:    General: Well Developed, well nourished, and in no acute distress.  Neuro: Alert and oriented x3, extra-ocular muscles intact, sensation grossly intact.  HEENT: Normocephalic, atraumatic, pupils equal round reactive to light, neck supple, no masses, no lymphadenopathy, thyroid nonpalpable.  Skin: Warm and dry, no rashes. Cardiac: Regular rate and rhythm, no murmurs rubs or gallops, no lower extremity edema.  Respiratory: Clear to auscultation bilaterally. Not using accessory muscles, speaking in full sentences. Right Shoulder: Inspection reveals no abnormalities, atrophy or asymmetry. Palpation is normal with no tenderness over AC joint or bicipital groove. ROM is full in all planes. Rotator cuff strength normal throughout. Positive Neer and Hawkin's tests, empty can. Speeds and Yergason's tests normal. No labral pathology noted with negative Obrien's, negative crank, negative clunk, and good stability. Normal scapular function observed. No painful arc and no drop arm sign. No apprehension sign  Procedure: Real-time Ultrasound Guided Injection of right subacromial  bursa Device: GE Logiq E  Verbal informed consent obtained.  Time-out conducted.  Noted no overlying erythema, induration, or other signs of local infection.  Skin prepped in a sterile fashion.  Local anesthesia: Topical Ethyl chloride.  With sterile technique and under real time ultrasound guidance:  1 cc Kenalog 40, lidocaine injected easily into the subacromial bursa. Completed without difficulty  Pain immediately resolved suggesting accurate placement of the medication.  Advised to call if fevers/chills, erythema, induration, drainage, or persistent bleeding.  Images permanently stored and available for review in the ultrasound unit.  Impression: Technically successful ultrasound guided injection.  Impression and Recommendations:

## 2014-01-04 ENCOUNTER — Ambulatory Visit (INDEPENDENT_AMBULATORY_CARE_PROVIDER_SITE_OTHER): Payer: BC Managed Care – PPO | Admitting: Sports Medicine

## 2014-01-04 ENCOUNTER — Encounter: Payer: Self-pay | Admitting: Sports Medicine

## 2014-01-04 ENCOUNTER — Ambulatory Visit (INDEPENDENT_AMBULATORY_CARE_PROVIDER_SITE_OTHER): Payer: BC Managed Care – PPO

## 2014-01-04 ENCOUNTER — Other Ambulatory Visit: Payer: Self-pay | Admitting: Physician Assistant

## 2014-01-04 VITALS — BP 128/82 | HR 88 | Ht 68.0 in | Wt 148.0 lb

## 2014-01-04 DIAGNOSIS — M75101 Unspecified rotator cuff tear or rupture of right shoulder, not specified as traumatic: Secondary | ICD-10-CM

## 2014-01-04 DIAGNOSIS — M25511 Pain in right shoulder: Secondary | ICD-10-CM | POA: Diagnosis not present

## 2014-01-04 NOTE — Progress Notes (Signed)
   Procedure: Real-time Ultrasound Guided gadolinium contrast injection of right glenohumeral joint Device: GE Logiq E  Verbal informed consent obtained.  Time-out conducted.  Noted no overlying erythema, induration, or other signs of local infection.  Skin prepped in a sterile fashion.  Local anesthesia: Topical Ethyl chloride.  With sterile technique and under real time ultrasound guidance:  Spinal needle advanced into the joint taking care to avoid the labrum, 1 mL kenalog 40, 4 mL lidocaine injected easily, syringe switched and 0.1 mL of dilute gadolinium was injected into the joint, syringe again switched and 10 mL of sterile saline injected into the joint. Joint visualized and capsule seen distending confirming intra-articular placement of contrast material and medication. Completed without difficulty  Advised to call if fevers/chills, erythema, induration, drainage, or persistent bleeding.  Images permanently stored and available for review in the ultrasound unit.  Impression: Technically successful ultrasound guided gadolinium contrast injection for MR arthrography.  Please see separate MR arthrogram report.

## 2014-01-04 NOTE — Assessment & Plan Note (Signed)
Ultrasound-guided arthrogram injection as above. Awaiting MRI report.

## 2014-01-05 ENCOUNTER — Telehealth: Payer: Self-pay

## 2014-01-05 DIAGNOSIS — M25511 Pain in right shoulder: Secondary | ICD-10-CM

## 2014-01-05 NOTE — Telephone Encounter (Signed)
Patient request a referral to Dr. Kristeen Misshandler [facility] for shoulder scope. Rhonda Cunningham,CMA

## 2014-01-05 NOTE — Telephone Encounter (Signed)
Referral placed,

## 2014-01-05 NOTE — Telephone Encounter (Signed)
error 

## 2014-01-11 ENCOUNTER — Encounter: Payer: Self-pay | Admitting: Sports Medicine

## 2014-02-12 ENCOUNTER — Ambulatory Visit (INDEPENDENT_AMBULATORY_CARE_PROVIDER_SITE_OTHER): Payer: BC Managed Care – PPO | Admitting: Sports Medicine

## 2014-02-12 ENCOUNTER — Encounter: Payer: Self-pay | Admitting: Sports Medicine

## 2014-02-12 VITALS — BP 133/77 | HR 87 | Ht 68.0 in | Wt 152.0 lb

## 2014-02-12 DIAGNOSIS — L600 Ingrowing nail: Secondary | ICD-10-CM | POA: Diagnosis not present

## 2014-02-12 MED ORDER — OXYCODONE-ACETAMINOPHEN 5-325 MG PO TABS: 1.0000 | ORAL_TABLET | Freq: Three times a day (TID) | ORAL | Status: DC | PRN

## 2014-02-12 NOTE — Progress Notes (Signed)
  Subjective:    CC: follow-up  HPI: Months ago we removed Casey Sweeney's left great toenail, she did extremely well but unfortunately has been cutting her toenail extremely close to the quick, she now has a recurrence of an ingrown toenail with pain but no signs of infection. Pain is moderate, persistent. No radiation.  Past medical history, Surgical history, Family history not pertinant except as noted below, Social history, Allergies, and medications have been entered into the medical record, reviewed, and no changes needed.   Review of Systems: No fevers, chills, night sweats, weight loss, chest pain, or shortness of breath.   Objective:    General: Well Developed, well nourished, and in no acute distress.  Neuro: Alert and oriented x3, extra-ocular muscles intact, sensation grossly intact.  HEENT: Normocephalic, atraumatic, pupils equal round reactive to light, neck supple, no masses, no lymphadenopathy, thyroid nonpalpable.  Skin: Warm and dry, no rashes. Cardiac: Regular rate and rhythm, no murmurs rubs or gallops, no lower extremity edema.  Respiratory: Clear to auscultation bilaterally. Not using accessory muscles, speaking in full sentences.  Procedure: Removal of left great toenail. Risks, benefits, alternatives explained to patient. Consent obtained. Time out conducted. Noted no overlying induration or erythema at site of injection. Toe cleaned with alcohol, then a total of 10 cc lidocaine 2% infiltrated at adjacent webspaces at the location of the bifurcation of the common digital nerve to proper digital nerves. Some lidocaine also infiltrated at hyponychium and under nail bed. Adequate anesthesia ensured. Toe prepped and draped in a sterile fashion. Nail elevator used to separate nail plate from nail bed. Hemostat then used to separate nail plate from surrounding structures. Nail bed and matrix treated with phenol. Minor bleeding controlled with pressure.Marland Kitchen. Antibiotic  ointment applied. Toe dressed. Advised to return if increased redness, swelling, drainage, fevers, or chills.  Impression and Recommendations:

## 2014-02-12 NOTE — Assessment & Plan Note (Signed)
Recurrence of left ingrown great toenail. Removal of nail plate with treatment with phenol. Hydrocodone for pain. Return to see me in a week for a wound check.

## 2014-02-15 ENCOUNTER — Ambulatory Visit: Payer: BC Managed Care – PPO | Admitting: Sports Medicine

## 2014-02-19 ENCOUNTER — Encounter: Payer: Self-pay | Admitting: Sports Medicine

## 2014-02-19 ENCOUNTER — Ambulatory Visit (INDEPENDENT_AMBULATORY_CARE_PROVIDER_SITE_OTHER): Payer: BC Managed Care – PPO | Admitting: Sports Medicine

## 2014-02-19 VITALS — BP 119/78 | HR 99 | Wt 153.0 lb

## 2014-02-19 DIAGNOSIS — L600 Ingrowing nail: Secondary | ICD-10-CM

## 2014-02-19 NOTE — Progress Notes (Signed)
  Subjective:  Doing extremely well 1 week post nail plate excision.  Objective: General: Well-developed, well-nourished, and in no acute distress. Left foot: Looks good, well healed, no signs of bacterial superinfection.  Assessment/plan:

## 2014-02-19 NOTE — Assessment & Plan Note (Signed)
1 week post left great nail plate excision and treatment with phenol, doing extremely well.

## 2014-04-05 ENCOUNTER — Encounter: Payer: Self-pay | Admitting: Physician Assistant

## 2014-04-05 ENCOUNTER — Ambulatory Visit (INDEPENDENT_AMBULATORY_CARE_PROVIDER_SITE_OTHER): Payer: BLUE CROSS/BLUE SHIELD | Admitting: Physician Assistant

## 2014-04-05 VITALS — BP 117/75 | HR 99 | Temp 98.0°F | Wt 156.0 lb

## 2014-04-05 DIAGNOSIS — G933 Postviral fatigue syndrome: Secondary | ICD-10-CM

## 2014-04-05 DIAGNOSIS — Z803 Family history of malignant neoplasm of breast: Secondary | ICD-10-CM | POA: Diagnosis not present

## 2014-04-05 DIAGNOSIS — N6489 Other specified disorders of breast: Secondary | ICD-10-CM | POA: Diagnosis not present

## 2014-04-05 DIAGNOSIS — G9331 Postviral fatigue syndrome: Secondary | ICD-10-CM

## 2014-04-05 DIAGNOSIS — R52 Pain, unspecified: Secondary | ICD-10-CM | POA: Diagnosis not present

## 2014-04-05 DIAGNOSIS — N644 Mastodynia: Secondary | ICD-10-CM | POA: Diagnosis not present

## 2014-04-05 LAB — POCT INFLUENZA A/B
INFLUENZA B, POC: NEGATIVE
Influenza A, POC: NEGATIVE

## 2014-04-05 NOTE — Patient Instructions (Signed)

## 2014-04-06 ENCOUNTER — Other Ambulatory Visit: Payer: Self-pay | Admitting: Physician Assistant

## 2014-04-06 DIAGNOSIS — Z803 Family history of malignant neoplasm of breast: Secondary | ICD-10-CM | POA: Insufficient documentation

## 2014-04-06 DIAGNOSIS — N644 Mastodynia: Secondary | ICD-10-CM

## 2014-04-06 DIAGNOSIS — N6489 Other specified disorders of breast: Secondary | ICD-10-CM

## 2014-04-06 DIAGNOSIS — R52 Pain, unspecified: Secondary | ICD-10-CM | POA: Insufficient documentation

## 2014-04-06 LAB — CBC WITH DIFFERENTIAL/PLATELET
Basophils Absolute: 0.1 10*3/uL (ref 0.0–0.1)
Basophils Relative: 1 % (ref 0–1)
EOS ABS: 0.5 10*3/uL (ref 0.0–0.7)
EOS PCT: 7 % — AB (ref 0–5)
HCT: 44 % (ref 36.0–46.0)
HEMOGLOBIN: 14.8 g/dL (ref 12.0–15.0)
LYMPHS ABS: 2 10*3/uL (ref 0.7–4.0)
Lymphocytes Relative: 28 % (ref 12–46)
MCH: 31 pg (ref 26.0–34.0)
MCHC: 33.6 g/dL (ref 30.0–36.0)
MCV: 92.2 fL (ref 78.0–100.0)
MPV: 10.1 fL (ref 8.6–12.4)
Monocytes Absolute: 0.7 10*3/uL (ref 0.1–1.0)
Monocytes Relative: 9 % (ref 3–12)
Neutro Abs: 4 10*3/uL (ref 1.7–7.7)
Neutrophils Relative %: 55 % (ref 43–77)
Platelets: 206 10*3/uL (ref 150–400)
RBC: 4.77 MIL/uL (ref 3.87–5.11)
RDW: 13.2 % (ref 11.5–15.5)
WBC: 7.3 10*3/uL (ref 4.0–10.5)

## 2014-04-06 LAB — HCG, SERUM, QUALITATIVE: Preg, Serum: NEGATIVE

## 2014-04-06 LAB — EPSTEIN-BARR VIRUS VCA ANTIBODY PANEL
EBV EA IgG: <5 U/mL (ref <9.0)
EBV NA IgG: 289 U/mL — ABNORMAL HIGH (ref <18.0)
EBV VCA IgG: 650 U/mL — ABNORMAL HIGH (ref <18.0)
EBV VCA IgM: <10 U/mL (ref <36.0)

## 2014-04-06 NOTE — Progress Notes (Signed)
   Subjective:    Patient ID: Casey Sweeney, female    DOB: 05/20/1980, 34 y.o.   MRN: [id number]  HPI  Patient is a 34 year old female who presents to the clinic with fatigue, body aches and ear pain. Symptoms have been going on for the last 2 days. She has not felt this tired since she had mono. She denies any fever. She denies any chills. She has not had any cough or upper respiratory symptoms. She denies any facial pain or sinus pressure. She has not tried anything to make better.  She is also having tremendous breast pain. The breast pain started before this acute illness. It has been going on for the last 6 months. She is also noticed that her breast have gotten fuller bilaterally. She is now 1 cup size bigger than she was 6 months ago. She's not getting any significant amount of weight. Her breasts are extremely painful to touch. She does have a maternal aunt that died of breast cancer and this is very concerning. She denies any nipple discharge or nipple retraction. She does have an IUD for pregnancy prevention but does not have regular periods with the IUD.  Review of Systems  All other systems reviewed and are negative.      Objective:   Physical Exam  Constitutional: She is oriented to person, place, and time. She appears well-developed and well-nourished.  HENT:  Head: Normocephalic and atraumatic.  Right Ear: External ear normal.  Left Ear: External ear normal.  Nose: Nose normal.  Mouth/Throat: Oropharynx is clear and moist. No oropharyngeal exudate.  TMs are clear bilaterally with no blood or pus present.  Eyes: Conjunctivae are normal. Right eye exhibits no discharge. Left eye exhibits no discharge.  Neck: Normal range of motion. Neck supple.  Cardiovascular: Normal rate, regular rhythm and normal heart sounds.   Pulmonary/Chest: Effort normal and breath sounds normal. She has no wheezes.  Breast exam done today bilaterally. There were no masses or lumps palpated. There  was extreme tenderness with palpation. This tenderness was throughout both bilateral breasts. There is no nipple discharge expressed today. There is no nipple retraction. There is no erythema, rashes or warts to either breast.  Abdominal: Soft. Bowel sounds are normal. She exhibits no distension and no mass. There is no tenderness. There is no rebound and no guarding.  No organomegaly or tenderness.  Lymphadenopathy:    She has no cervical adenopathy.  Neurological: She is alert and oriented to person, place, and time.  Skin:  Flushed cheeks.  Psychiatric: She has a normal mood and affect. Her behavior is normal.          Assessment & Plan:  Body aches/fatigue-influenza was negative. Will check for mono. Will also get a CBC. Did write her out of work for today and tomorrow. It appears she has some type of viral illness. There is no bacterial findings for infection today.  Bilateral breast pain/breast fullness-breast were extremely tender today. She almost cried on exam today. I do not feel any masses or lumps or anything suspicious. Will get a serum pregnancy test is to confirm she is not pregnant. Will also order ultrasound of breast to evaluate any type of potential malignancy. Certainly increase pain could be due to viral illness making her breasts more sensitive.

## 2014-04-07 ENCOUNTER — Other Ambulatory Visit: Payer: Self-pay | Admitting: Physician Assistant

## 2014-04-07 MED ORDER — PREDNISONE 50 MG PO TABS: 50.0000 mg | ORAL_TABLET | Freq: Every day | ORAL | Status: DC

## 2014-04-30 ENCOUNTER — Other Ambulatory Visit: Payer: Self-pay | Admitting: Physician Assistant

## 2014-04-30 DIAGNOSIS — N644 Mastodynia: Secondary | ICD-10-CM

## 2014-04-30 DIAGNOSIS — Z803 Family history of malignant neoplasm of breast: Secondary | ICD-10-CM

## 2014-04-30 DIAGNOSIS — N6489 Other specified disorders of breast: Secondary | ICD-10-CM

## 2014-05-07 ENCOUNTER — Ambulatory Visit
Admission: RE | Admit: 2014-05-07 | Discharge: 2014-05-07 | Disposition: A | Discharge status: Home / Self Care | Payer: BLUE CROSS/BLUE SHIELD | Source: Ambulatory Visit | Attending: Physician Assistant | Admitting: Physician Assistant

## 2014-05-07 DIAGNOSIS — N6489 Other specified disorders of breast: Secondary | ICD-10-CM

## 2014-05-07 DIAGNOSIS — Z803 Family history of malignant neoplasm of breast: Secondary | ICD-10-CM

## 2014-05-07 DIAGNOSIS — N644 Mastodynia: Secondary | ICD-10-CM

## 2014-05-11 ENCOUNTER — Encounter: Payer: Self-pay | Admitting: Physician Assistant

## 2014-05-11 ENCOUNTER — Ambulatory Visit (INDEPENDENT_AMBULATORY_CARE_PROVIDER_SITE_OTHER): Payer: BLUE CROSS/BLUE SHIELD | Admitting: Physician Assistant

## 2014-05-11 VITALS — BP 120/84 | HR 103 | Ht 68.0 in | Wt 156.0 lb

## 2014-05-11 DIAGNOSIS — M25511 Pain in right shoulder: Secondary | ICD-10-CM | POA: Diagnosis not present

## 2014-05-11 DIAGNOSIS — Z0000 Encounter for general adult medical examination without abnormal findings: Secondary | ICD-10-CM | POA: Diagnosis not present

## 2014-05-11 DIAGNOSIS — F32A Depression, unspecified: Secondary | ICD-10-CM

## 2014-05-11 DIAGNOSIS — Z716 Tobacco abuse counseling: Secondary | ICD-10-CM

## 2014-05-11 DIAGNOSIS — Z131 Encounter for screening for diabetes mellitus: Secondary | ICD-10-CM

## 2014-05-11 DIAGNOSIS — M222X2 Patellofemoral disorders, left knee: Secondary | ICD-10-CM | POA: Diagnosis not present

## 2014-05-11 DIAGNOSIS — F329 Major depressive disorder, single episode, unspecified: Secondary | ICD-10-CM

## 2014-05-11 DIAGNOSIS — Z13220 Encounter for screening for lipoid disorders: Secondary | ICD-10-CM

## 2014-05-11 DIAGNOSIS — F419 Anxiety disorder, unspecified: Secondary | ICD-10-CM

## 2014-05-11 MED ORDER — VARENICLINE TARTRATE 0.5 MG X 11 & 1 MG X 42 PO MISC: ORAL | Status: DC

## 2014-05-11 MED ORDER — SERTRALINE HCL 25 MG PO TABS: ORAL_TABLET | ORAL | Status: DC

## 2014-05-11 MED ORDER — MELOXICAM 15 MG PO TABS: 15.0000 mg | ORAL_TABLET | Freq: Every day | ORAL | Status: DC

## 2014-05-11 NOTE — Patient Instructions (Addendum)
Keeping You Healthy  Get These Tests  Blood Pressure- Have your blood pressure checked once a year by your health care provider.  Normal blood pressure is 120/80.  Weight- Have your body mass index (BMI) calculated to screen for obesity.  BMI is measure of body fat based on height and weight.  You can also calculate your own BMI at https://www.west-esparza.com/.  Cholesterol- Have your cholesterol checked every 5 years starting at age 32 then yearly starting at age 65.  Chlamydia, HIV, and other sexually transmitted diseases- Get screened every year until age 57, then within three months of each new sexual provider.  Pap Smear- Every 1-3 years; discuss with your health care provider.  Mammogram- Every year starting at age 79  Take these medicines  Calcium with Vitamin D-Your body needs 1200 mg of Calcium each day and [phone] IU of Vitamin D daily.  Your body can only absorb 500 mg of Calcium at a time so Calcium must be taken in 2 or 3 divided doses throughout the day.  Multivitamin with folic acid- Once daily if it is possible for you to become pregnant.  Get these Immunizations  Gardasil-Series of three doses; prevents HPV related illness such as genital warts and cervical cancer.  Menactra-Single dose; prevents meningitis.  Tetanus shot- Every 10 years.  Flu shot-Every year.  Take these steps  Do not smoke-Your healthcare provider can help you quit.  For tips on how to quit go to www.smokefree.gov or call 1-800 QUITNOW.  Be physically active- Exercise 5 days a week for at least 30 minutes.  If you are not already physically active, start slow and gradually work up to 30 minutes of moderate physical activity.  Examples of moderate activity include walking briskly, dancing, swimming, bicycling, etc.  Breast Cancer- A self breast exam every month is important for early detection of breast cancer.  For more information and instruction on self breast exams, ask your healthcare  provider or [email or web address].  Eat a healthy diet- Eat a variety of healthy foods such as fruits, vegetables, whole grains, low fat milk, low fat cheeses, yogurt, lean meats, poultry and fish, beans, nuts, tofu, etc.  For more information go to www. Thenutritionsource.org  Drink alcohol in moderation- Limit alcohol intake to one drink or less per day. Never drink and drive.  Depression- Your emotional health is as important as your physical health.  If you're feeling down or losing interest in things you normally enjoy please talk to your healthcare provider about being screened for depression.  Dental visit- Brush and floss your teeth twice daily; visit your dentist twice a year.  Eye doctor- Get an eye exam at least every 2 years.  Helmet use- Always wear a helmet when riding a bicycle, motorcycle, rollerblading or skateboarding.  Safe sex- If you may be exposed to sexually transmitted infections, use a condom.  Seat belts- Seat belts can save your live; always wear one.  Smoke/Carbon Monoxide detectors- These detectors need to be installed on the appropriate level of your home. Replace batteries at least once a year.  Skin cancer- When out in the sun please cover up and use sunscreen 15 SPF or higher.  Violence- If anyone is threatening or hurting you, please tell your healthcare provider.       Smoking Cessation Quitting smoking is important to your health and has many advantages. However, it is not always easy to quit since nicotine is a very addictive drug. Oftentimes, people try 3 times or  more before being able to quit. This document explains the best ways for you to prepare to quit smoking. Quitting takes hard work and a lot of effort, but you can do it. ADVANTAGES OF QUITTING SMOKING  You will live longer, feel better, and live better.  Your body will feel the impact of quitting smoking almost immediately.  Within 20 minutes, blood pressure  decreases. Your pulse returns to its normal level.  After 8 hours, carbon monoxide levels in the blood return to normal. Your oxygen level increases.  After 24 hours, the chance of having a heart attack starts to decrease. Your breath, hair, and body stop smelling like smoke.  After 48 hours, damaged nerve endings begin to recover. Your sense of taste and smell improve.  After 72 hours, the body is virtually free of nicotine. Your bronchial tubes relax and breathing becomes easier.  After 2 to 12 weeks, lungs can hold more air. Exercise becomes easier and circulation improves.  The risk of having a heart attack, stroke, cancer, or lung disease is greatly reduced.  After 1 year, the risk of coronary heart disease is cut in half.  After 5 years, the risk of stroke falls to the same as a nonsmoker.  After 10 years, the risk of lung cancer is cut in half and the risk of other cancers decreases significantly.  After 15 years, the risk of coronary heart disease drops, usually to the level of a nonsmoker.  If you are pregnant, quitting smoking will improve your chances of having a healthy baby.  The people you live with, especially any children, will be healthier.  You will have extra money to spend on things other than cigarettes. QUESTIONS TO THINK ABOUT BEFORE ATTEMPTING TO QUIT You may want to talk about your answers with your health care provider.  Why do you want to quit?  If you tried to quit in the past, what helped and what did not?  What will be the most difficult situations for you after you quit? How will you plan to handle them?  Who can help you through the tough times? Your family? Friends? A health care provider?  What pleasures do you get from smoking? What ways can you still get pleasure if you quit? Here are some questions to ask your health care provider:  How can you help me to be successful at quitting?  What medicine do you think would be best for me and how  should I take it?  What should I do if I need more help?  What is smoking withdrawal like? How can I get information on withdrawal? GET READY  Set a quit date.  Change your environment by getting rid of all cigarettes, ashtrays, matches, and lighters in your home, car, or work. Do not let people smoke in your home.  Review your past attempts to quit. Think about what worked and what did not. GET SUPPORT AND ENCOURAGEMENT You have a better chance of being successful if you have help. You can get support in many ways.  Tell your family, friends, and coworkers that you are going to quit and need their support. Ask them not to smoke around you.  Get individual, group, or telephone counseling and support. Programs are available at [employer] and health centers. Call your local health department for information about programs in your area.  Spiritual beliefs and practices may help some smokers quit.  Download a "quit meter" on your computer to keep track of  quit statistics, such as how long you have gone without smoking, cigarettes not smoked, and money saved.  Get a self-help book about quitting smoking and staying off tobacco. LEARN NEW SKILLS AND BEHAVIORS  Distract yourself from urges to smoke. Talk to someone, go for a walk, or occupy your time with a task.  Change your normal routine. Take a different route to work. Drink tea instead of coffee. Eat breakfast in a different place.  Reduce your stress. Take a hot bath, exercise, or read a book.  Plan something enjoyable to do every day. Reward yourself for not smoking.  Explore interactive web-based programs that specialize in helping you quit. GET MEDICINE AND USE IT CORRECTLY Medicines can help you stop smoking and decrease the urge to smoke. Combining medicine with the above behavioral methods and support can greatly increase your chances of successfully quitting smoking.  Nicotine replacement therapy helps deliver  nicotine to your body without the negative effects and risks of smoking. Nicotine replacement therapy includes nicotine gum, lozenges, inhalers, nasal sprays, and skin patches. Some may be available over-the-counter and others require a prescription.  Antidepressant medicine helps people abstain from smoking, but how this works is unknown. This medicine is available by prescription.  Nicotinic receptor partial agonist medicine simulates the effect of nicotine in your brain. This medicine is available by prescription. Ask your health care provider for advice about which medicines to use and how to use them based on your health history. Your health care provider will tell you what side effects to look out for if you choose to be on a medicine or therapy. Carefully read the information on the package. Do not use any other product containing nicotine while using a nicotine replacement product.  RELAPSE OR DIFFICULT SITUATIONS Most relapses occur within the first 3 months after quitting. Do not be discouraged if you start smoking again. Remember, most people try several times before finally quitting. You may have symptoms of withdrawal because your body is used to nicotine. You may crave cigarettes, be irritable, feel very hungry, cough often, get headaches, or have difficulty concentrating. The withdrawal symptoms are only temporary. They are strongest when you first quit, but they will go away within 10-14 days. To reduce the chances of relapse, try to:  Avoid drinking alcohol. Drinking lowers your chances of successfully quitting.  Reduce the amount of caffeine you consume. Once you quit smoking, the amount of caffeine in your body increases and can give you symptoms, such as a rapid heartbeat, sweating, and anxiety.  Avoid smokers because they can make you want to smoke.  Do not let weight gain distract you. Many smokers will gain weight when they quit, usually less than 10 pounds. Eat a healthy diet  and stay active. You can always lose the weight gained after you quit.  Find ways to improve your mood other than smoking. FOR MORE INFORMATION  www.smokefree.gov  Document Released: 02/20/2001 Document Revised: 07/13/2013 Document Reviewed: 06/07/2011 [facility] Patient Information 2015 [employer] [place]. This information is not intended to replace advice given to you by your health care provider. Make sure you discuss any questions you have with your health care provider.

## 2014-05-13 ENCOUNTER — Other Ambulatory Visit: Payer: Self-pay | Admitting: *Deleted

## 2014-05-13 MED ORDER — IVERMECTIN 0.5 % EX LOTN: TOPICAL_LOTION | CUTANEOUS | Status: DC

## 2014-05-14 ENCOUNTER — Encounter: Payer: BLUE CROSS/BLUE SHIELD | Admitting: Physician Assistant

## 2014-05-17 ENCOUNTER — Other Ambulatory Visit: Payer: Self-pay | Admitting: *Deleted

## 2014-05-17 ENCOUNTER — Telehealth: Payer: Self-pay | Admitting: *Deleted

## 2014-05-17 MED ORDER — AMOXICILLIN 500 MG PO CAPS: 500.0000 mg | ORAL_CAPSULE | Freq: Two times a day (BID) | ORAL | Status: DC

## 2014-05-17 NOTE — Telephone Encounter (Signed)
Amoxicillin 500mg  bid for 10 days #20 NF

## 2014-05-17 NOTE — Telephone Encounter (Signed)
Pt left vm stating that 2 of her kids (caleb & alicia) we dx with strep over the wknd.  She said that she's starting to feel like "crap" today and her throat is starting to get sore.  She wanted to know if you'd send over abx or if she needs to come in.

## 2014-05-17 NOTE — Progress Notes (Signed)
  Subjective:     Casey Sweeney is a 34 y.o. female and is here for a comprehensive physical exam. The patient reports needs refill on zoloft and mobic. Marland Kitchen.  History   Social History  . Marital Status: Married    Spouse Name: N/A  . Number of Children: N/A  . Years of Education: N/A   Occupational History  . unemployed    Social History Main Topics  . Smoking status: Current Every Day Smoker -- 1.00 packs/day for 10 years    Types: Cigarettes  . Smokeless tobacco: Never Used  . Alcohol Use: No  . Drug Use: No  . Sexual Activity:    Partners: Male    Birth Control/ Protection: IUD   Other Topics Concern  . Not on file   Social History Narrative   Health Maintenance  Topic Date Due  . INFLUENZA VACCINE  11/11/2014 (Originally 10/10/2013)  . PAP SMEAR  12/19/2014  . TETANUS/TDAP  03/27/2019  . HIV Screening  Completed    The following portions of the patient's history were reviewed and updated as appropriate: allergies, current medications, past family history, past medical history, past social history, past surgical history and problem list.  Review of Systems A comprehensive review of systems was negative.   Objective:    BP 120/84 mmHg  Pulse 103  Ht 5\' 8"  (1.727 m)  Wt 156 lb (70.761 kg)  BMI 23.73 kg/m2 General appearance: alert, cooperative and appears stated age Head: Normocephalic, without obvious abnormality, atraumatic Eyes: conjunctivae/corneas clear. PERRL, EOM's intact. Fundi benign. Ears: normal TM's and external ear canals both ears Nose: Nares normal. Septum midline. Mucosa normal. No drainage or sinus tenderness. Throat: lips, mucosa, and tongue normal; teeth and gums normal Neck: no adenopathy, no carotid bruit, no JVD, supple, symmetrical, trachea midline and thyroid not enlarged, symmetric, no tenderness/mass/nodules Back: symmetric, no curvature. ROM normal. No CVA tenderness. Lungs: clear to auscultation bilaterally Heart: regular rate and  rhythm, S1, S2 normal, no murmur, click, rub or gallop Abdomen: soft, non-tender; bowel sounds normal; no masses,  no organomegaly Extremities: extremities normal, atraumatic, no cyanosis or edema Pulses: 2+ and symmetric Skin: Skin color, texture, turgor normal. No rashes or lesions Lymph nodes: Cervical, supraclavicular, and axillary nodes normal. Neurologic: Grossly normal      Assessment:    Healthy female exam.      Plan:    cpe- screening labs ordered. Vaccines up to date. Pap up to date. Although early recently had mammogram and was normal. Discussed diet and exercirse. Encouraged calcium 1200mg  and vitamin d 800 units.   Right shoulder pain/patellofemoral syndrome- refilled mobic. Follow up with Dr. Karie Schwalbe for further management.   Depression/anxiety- refilled zoloft. Doing well. No concerns or complaints.   Tobacco abuse- script for chantix given. Not ready to get filled quite yet but really does want to quit smoking. Discussed when to place quit date. Follow up 1 month after starting for accountability. reassued pt this is one of the best things you can do for your health.  See After Visit Summary for Counseling Recommendations

## 2014-05-17 NOTE — Telephone Encounter (Signed)
rx sent & LMOM notifying pt. 

## 2014-05-23 ENCOUNTER — Other Ambulatory Visit: Payer: Self-pay | Admitting: Physician Assistant

## 2014-06-01 ENCOUNTER — Other Ambulatory Visit: Payer: Self-pay | Admitting: *Deleted

## 2014-06-01 MED ORDER — SERTRALINE HCL 25 MG PO TABS: ORAL_TABLET | ORAL | Status: DC

## 2014-06-01 NOTE — Telephone Encounter (Signed)
Patient called stating she went to get refill & had 0 refills left. Called Patient asking what medications is she looking to have refilled.

## 2014-07-23 ENCOUNTER — Ambulatory Visit: Payer: BLUE CROSS/BLUE SHIELD | Admitting: Certified Nurse Midwife

## 2014-07-23 DIAGNOSIS — Z124 Encounter for screening for malignant neoplasm of cervix: Secondary | ICD-10-CM

## 2014-08-12 ENCOUNTER — Other Ambulatory Visit: Payer: Self-pay | Admitting: Physician Assistant

## 2014-09-20 ENCOUNTER — Ambulatory Visit (INDEPENDENT_AMBULATORY_CARE_PROVIDER_SITE_OTHER): Payer: BLUE CROSS/BLUE SHIELD | Admitting: Family Medicine

## 2014-09-20 ENCOUNTER — Encounter: Payer: Self-pay | Admitting: Family Medicine

## 2014-09-20 VITALS — BP 120/73 | HR 81 | Temp 98.1°F | Wt 156.0 lb

## 2014-09-20 DIAGNOSIS — J029 Acute pharyngitis, unspecified: Secondary | ICD-10-CM | POA: Diagnosis not present

## 2014-09-20 LAB — POCT RAPID STREP A (OFFICE): Rapid Strep A Screen: NEGATIVE

## 2014-09-20 MED ORDER — PREDNISONE 10 MG PO TABS: 30.0000 mg | ORAL_TABLET | Freq: Every day | ORAL | Status: DC

## 2014-09-20 MED ORDER — IPRATROPIUM BROMIDE 0.06 % NA SOLN: 2.0000 | Freq: Four times a day (QID) | NASAL | Status: DC

## 2014-09-20 NOTE — Patient Instructions (Signed)
Thank you for coming in today. Use over-the-counter Zaditor eyedrops (Ketotifen) Use Systane artificial tears as needed Please consider quitting smoking  Pharyngitis Pharyngitis is redness, pain, and swelling (inflammation) of your pharynx.  CAUSES  Pharyngitis is usually caused by infection. Most of the time, these infections are from viruses (viral) and are part of a cold. However, sometimes pharyngitis is caused by bacteria (bacterial). Pharyngitis can also be caused by allergies. Viral pharyngitis may be spread from person to person by coughing, sneezing, and personal items or utensils (cups, forks, spoons, toothbrushes). Bacterial pharyngitis may be spread from person to person by more intimate contact, such as kissing.  SIGNS AND SYMPTOMS  Symptoms of pharyngitis include:   Sore throat.   Tiredness (fatigue).   Low-grade fever.   Headache.  Joint pain and muscle aches.  Skin rashes.  Swollen lymph nodes.  Plaque-like film on throat or tonsils (often seen with bacterial pharyngitis). DIAGNOSIS  Your health care provider will ask you questions about your illness and your symptoms. Your medical history, along with a physical exam, is often all that is needed to diagnose pharyngitis. Sometimes, a rapid strep test is done. Other lab tests may also be done, depending on the suspected cause.  TREATMENT  Viral pharyngitis will usually get better in 3-4 days without the use of medicine. Bacterial pharyngitis is treated with medicines that kill germs (antibiotics).  HOME CARE INSTRUCTIONS   Drink enough water and fluids to keep your urine clear or pale yellow.   Only take over-the-counter or prescription medicines as directed by your health care provider:   If you are prescribed antibiotics, make sure you finish them even if you start to feel better.   Do not take aspirin.   Get lots of rest.   Gargle with 8 oz of salt water ( tsp of salt per 1 qt of water) as often  as every 1-2 hours to soothe your throat.   Throat lozenges (if you are not at risk for choking) or sprays may be used to soothe your throat. SEEK MEDICAL CARE IF:   You have large, tender lumps in your neck.  You have a rash.  You cough up green, yellow-brown, or bloody spit. SEEK IMMEDIATE MEDICAL CARE IF:   Your neck becomes stiff.  You drool or are unable to swallow liquids.  You vomit or are unable to keep medicines or liquids down.  You have severe pain that does not go away with the use of recommended medicines.  You have trouble breathing (not caused by a stuffy nose). MAKE SURE YOU:   Understand these instructions.  Will watch your condition.  Will get help right away if you are not doing well or get worse. Document Released: 02/26/2005 Document Revised: 12/17/2012 Document Reviewed: 11/03/2012 [facility] Patient Information 2015 [place] [place]. This information is not intended to replace advice given to you by your health care provider. Make sure you discuss any questions you have with your health care provider.

## 2014-09-20 NOTE — Assessment & Plan Note (Signed)
Symptoms are likely due to viral etiology. Treatment with prednisone and Atrovent nasal spray as patient is quite symptomatic. Throat culture pending. Return as needed. Recommend smoking cessation.

## 2014-09-20 NOTE — Progress Notes (Signed)
Casey Sweeney is a 34 y.o. female who [facility]  today for sore throat and nasal congestion and discharge your pain. Symptoms started yesterday. No fevers chills nausea vomiting or diarrhea. No treatment tried yet. Patient is a smoker. No trouble with breathing or wheezing.   Past Medical History  Diagnosis Date  . Anxiety   . Depression   . Irritable bowel syndrome   . Kidney stones   . History of recurrent UTIs   . Headache(784.0)     migraines   Past Surgical History  Procedure Laterality Date  . No past surgeries    . Cholecystectomy  10/17/12    lap chole  . Cholecystectomy N/A 10/17/2012    Procedure: LAPAROSCOPIC CHOLECYSTECTOMY;  Surgeon: Axel FillerArmando Ramirez, MD;  [facility];  Service: General;  Laterality: N/A;   History  Substance Use Topics  . Smoking status: Current Every Day Smoker -- 1.00 packs/day for 10 years    Types: Cigarettes  . Smokeless tobacco: Never Used  . Alcohol Use: No   ROS as above Medications: Current Outpatient Prescriptions  Medication Sig Dispense Refill  . amoxicillin (AMOXIL) 500 MG capsule Take 1 capsule (500 mg total) by mouth 2 (two) times daily. 20 capsule 0  . Ivermectin 0.5 % LOTN Apply liberally once to entire scalp. 1 Tube 1  . levocetirizine (XYZAL) 5 MG tablet TAKE 1 TABLET (5 MG TOTAL) BY MOUTH EVERY EVENING. 30 tablet 11  . meloxicam (MOBIC) 15 MG tablet Take 1 tablet (15 mg total) by mouth daily. 30 tablet 5  . sertraline (ZOLOFT) 25 MG tablet TAKE 1 TABLET (25 MG TOTAL) BY MOUTH DAILY. 90 tablet 1  . varenicline (CHANTIX STARTING MONTH PAK) 0.5 MG X 11 & 1 MG X 42 tablet Take one 0.5mg  tablet by mouth once daily for 3 days, then increase to one 0.5mg  tablet twice daily for 3 days, then increase to one 1mg  tablet twice daily. 53 tablet 0  . vitamin E 1000 UNIT capsule Take 800 Units by mouth daily.    Marland Kitchen. ipratropium (ATROVENT) 0.06 % nasal spray Place 2 sprays into both nostrils 4  (four) times daily. 15 mL 1  . predniSONE (DELTASONE) 10 MG tablet Take 3 tablets (30 mg total) by mouth daily. 15 tablet 0   No current facility-administered medications for this visit.   No Known Allergies   Exam:  BP 120/73 mmHg  Pulse 81  Temp(Src) 98.1 F (36.7 C) (Oral)  Wt 156 lb (70.761 kg) Gen: Well NAD HEENT: EOMI,  MMM clear nasal discharge. Inflamed nasal turbinate is bilaterally. Normal tympanic membranes bilaterally. Posterior pharynx with cobblestoning. Cervical lymphadenopathy present bilaterally. Mild conjunctival injection bilaterally. Lungs: Normal work of breathing. CTABL Heart: RRR no MRG Abd: NABS, Soft. Nondistended, Nontender Exts: Brisk capillary refill, warm and well perfused.    Rapid strep test was negative.  No results found for this or any previous visit (from the past 24 hour(s)). No results found.   Please see individual assessment and plan sections.

## 2014-10-29 ENCOUNTER — Ambulatory Visit (INDEPENDENT_AMBULATORY_CARE_PROVIDER_SITE_OTHER): Payer: BLUE CROSS/BLUE SHIELD | Admitting: Osteopathic Medicine

## 2014-10-29 ENCOUNTER — Encounter: Payer: Self-pay | Admitting: Osteopathic Medicine

## 2014-10-29 VITALS — BP 126/86 | HR 75 | Temp 98.0°F | Ht 68.0 in | Wt 150.0 lb

## 2014-10-29 DIAGNOSIS — Z9889 Other specified postprocedural states: Secondary | ICD-10-CM | POA: Diagnosis not present

## 2014-10-29 DIAGNOSIS — R109 Unspecified abdominal pain: Secondary | ICD-10-CM

## 2014-10-29 DIAGNOSIS — Z9049 Acquired absence of other specified parts of digestive tract: Secondary | ICD-10-CM

## 2014-10-29 DIAGNOSIS — N3000 Acute cystitis without hematuria: Secondary | ICD-10-CM

## 2014-10-29 LAB — POCT URINALYSIS DIPSTICK
Bilirubin, UA: NEGATIVE
Blood, UA: NEGATIVE
Glucose, UA: NEGATIVE
KETONES UA: NEGATIVE
LEUKOCYTES UA: NEGATIVE
Nitrite, UA: NEGATIVE
Protein, UA: NEGATIVE
Spec Grav, UA: 1.015
Urobilinogen, UA: 0.2
pH, UA: 6

## 2014-10-29 MED ORDER — SULFAMETHOXAZOLE-TRIMETHOPRIM 800-160 MG PO TABS: 1.0000 | ORAL_TABLET | Freq: Two times a day (BID) | ORAL | Status: DC

## 2014-10-29 NOTE — Progress Notes (Signed)
Chief Complaint: Possible UTI  History of Present Illness: Casey Sweeney is a 34 y.o. female who presents to [facility]  today with concerns for acute urinary tract infection. Also concerned about diarrhea since cholecystectomy, doing better on probiotic however recent vacaion, lots of fatty foods, her symptoms are a bit exacerbated. Stool frequent, loose but no blood/watery stool, thinks this may be contributing to her UTI symptoms. Similar symptoms in the past with UTI.   Onset: 4 days Location: Suprapubic/'Kidneys" on R Quality: Burning/Urgency Exacerbating factors:   Frequency: maybe  Hematuria: no  Odor: yes  Fever/chills: maybe, "woke up sweaty a few times"  Incontinence: no  Flank Pain: yes on R Previous UTI: none in past year Recurrent UTI (3 times/more annually): no Abx in past 3 months: no  Complicated (any of the following): NO ?Diabetes ?Pregnancy - IUD ?Symptoms for seven or more days before seeking care ?Hospital acquired infection ?Renal failure ?Urinary tract obstruction ?Presence of an indwelling urethral catheter, stent, nephrostomy tube or urinary diversion ?Functional or anatomic abnormality of the urinary tract ?Renal transplantation ?Immunosuppression   Past medical, social and family history reviewed: Past Medical History  Diagnosis Date  . Anxiety   . Depression   . Irritable bowel syndrome   . Kidney stones   . History of recurrent UTIs   . Headache(784.0)     migraines   Past Surgical History  Procedure Laterality Date  . No past surgeries    . Cholecystectomy  10/17/12    lap chole  . Cholecystectomy N/A 10/17/2012    Procedure: LAPAROSCOPIC CHOLECYSTECTOMY;  Surgeon: Axel Filler, MD;  Location: [facility] OR;  Service: General;  Laterality: N/A;   Social History  Substance Use Topics  . Smoking status: Current Every Day Smoker -- 1.00 packs/day for 10 years    Types: Cigarettes  . Smokeless tobacco:  Never Used  . Alcohol Use: No   The patient has a family history of  Current Outpatient Prescriptions  Medication Sig Dispense Refill  . sertraline (ZOLOFT) 25 MG tablet TAKE 1 TABLET (25 MG TOTAL) BY MOUTH DAILY. 90 tablet 1  .    0  . varenicline (CHANTIX STARTING MONTH PAK) 0.5 MG X 11 & 1 MG X 42 tablet Take one 0.5mg  tablet by mouth once daily for 3 days, then increase to one 0.5mg  tablet twice daily for 3 days, then increase to one 1mg  tablet twice daily. (Patient not taking: Reported on 10/29/2014) 53 tablet 0   No current facility-administered medications for this visit.   No Known Allergies   Review of Systems: CONSTITUTIONAL: Neg fever/chills, no unintentional weight changes CARDIAC: No chest pain/pressure/palpitations, no orthopnea RESPIRATORY: No cough/shortness of breath/wheeze GASTROINTESTINAL: No nausea/vomiting/abdominal pain/blood in stool/constipation. Reports some diarrhea (hx cholecystectomy this is a chronic issue) MUSCULOSKELETAL: No myalgia/arthralgia/back pain GENITOURINARY: No incontinence, No abnormal genital bleeding/discharge. (+)UTI symptoms as per HPI    Exam:  Filed Vitals:   10/29/14 1300  Height: 5\' 8"  (1.727 m)  Weight: 150 lb (68.04 kg)   Constitutional: VSS, see above. General Appearance: alert, well-developed, well-nourished, NAD Respiratory: Normal respiratory effort. No dullness/hyper-resonance to percussion. Breath sounds normal, no wheeze/rhonchi/rales Cardiovascular: S1/S2 normal, no murmur/rub/gallop auscultated. No carotid bruit or JVD. No abdominal aortic bruit. Pedal pulse II/IV bilaterally DP and PT. No lower extremity edema. Gastrointestinal: Nontender, no masses. No hepatomegaly, no splenomegaly. No hernia appreciated. Rectal exam deferred.  Musculoskeletal: Gait normal. No clubbing/cyanosis of digits. Lloyd sign negative L, mild  positive R. Normal ROM and palpation of spine.    ASSESSMENT/PLAN:  Acute cystitis without hematuria -  Plan: sulfamethoxazole-trimethoprim (BACTRIM DS,SEPTRA DS) 800-160 MG per tablet, POCT urine pregnancy, Urine culture  Right flank pain - Plan: POCT urinalysis dipstick  Hx of cholecystectomy  Cx to confirm, suspect may be negative but will treat as UTI based on symptoms.  RTC if no improvement.

## 2014-10-29 NOTE — Patient Instructions (Signed)

## 2014-10-31 LAB — URINE CULTURE: Colony Count: >=100000

## 2014-11-19 ENCOUNTER — Encounter: Payer: Self-pay | Admitting: Osteopathic Medicine

## 2014-11-19 ENCOUNTER — Ambulatory Visit (INDEPENDENT_AMBULATORY_CARE_PROVIDER_SITE_OTHER): Payer: BLUE CROSS/BLUE SHIELD | Admitting: Osteopathic Medicine

## 2014-11-19 VITALS — BP 121/80 | HR 96 | Wt 148.0 lb

## 2014-11-19 DIAGNOSIS — J0140 Acute pansinusitis, unspecified: Secondary | ICD-10-CM | POA: Diagnosis not present

## 2014-11-19 MED ORDER — FLUTICASONE PROPIONATE 50 MCG/ACT NA SUSP: 2.0000 | Freq: Every day | NASAL | Status: DC

## 2014-11-19 MED ORDER — AMOXICILLIN-POT CLAVULANATE 875-125 MG PO TABS: 1.0000 | ORAL_TABLET | Freq: Two times a day (BID) | ORAL | Status: DC

## 2014-11-19 NOTE — Progress Notes (Signed)
HPI: Casey Sweeney is a 34 y.o. female who presents to [facility]  today for chief complaint of:  Chief Complaint  Patient presents with  . Acute Visit    sinus problems with headache x 1 1/2 week   . Location: sinuses, behind eyes . Quality: sore, congested . Severity: mod/severe . Duration: 1.5 weeks . Context: thought was cold but feeling worse . Modifying factors: Flonase once per day not helping . Assoc signs/symptoms: no fever/chlls, mild nonproductive cough,     Past medical, social and family history reviewed: Past Medical History  Diagnosis Date  . Anxiety   . Depression   . Irritable bowel syndrome   . Kidney stones   . History of recurrent UTIs   . Headache(784.0)     migraines   Past Surgical History  Procedure Laterality Date  . No past surgeries    . Cholecystectomy  10/17/12    lap chole  . Cholecystectomy N/A 10/17/2012    Procedure: LAPAROSCOPIC CHOLECYSTECTOMY;  Surgeon: Axel Filler, MD;  Location[facility] OR;  Service: General;  Laterality: N/A;   Social History  Substance Use Topics  . Smoking status: Current Every Day Smoker -- 1.00 packs/day for 10 years    Types: Cigarettes  . Smokeless tobacco: Never Used  . Alcohol Use: No   Family History  Problem Relation Age of Onset  . Cancer Other 80    unknown cancer  . Cancer Maternal Grandfather     unknown  . Hypertension Mother   . Cancer Paternal Aunt     breast    Current Outpatient Prescriptions  Medication Sig Dispense Refill  . sertraline (ZOLOFT) 25 MG tablet TAKE 1 TABLET (25 MG TOTAL) BY MOUTH DAILY. 90 tablet 1   No current facility-administered medications for this visit.   No Known Allergies   Review of Systems: CONSTITUTIONAL: Neg fever/chills, no unintentional weight changes HEAD/EYES/EARS/NOSE/THROAT: No headache/vision change or hearing change, no sore throat. (+) sinus pressure as per HPI CARDIAC: No chest pain/pressure/palpitations,  no orthopnea RESPIRATORY: (+) dry cough, no shortness of breath/wheeze GASTROINTESTINAL: No nausea/vomiting/abdominal pain/blood in stool/diarrhea/constipation MUSCULOSKELETAL: No myalgia/arthralgia   Exam:  BP 121/80 mmHg  Pulse 96  Wt 148 lb (67.132 kg)  SpO2 96% Constitutional: VSS, see above. General Appearance: alert, well-developed, well-nourished, NAD Eyes: Normal lids and conjunctive, non-icteric sclera, PERRLA Ears, Nose, Mouth, Throat: Normal external inspection ears/nares/mouth/lips/gums, nasal mucosa red/congested Normal TM bilaterally, MMM, posterior pharynx without exudate (+) mild erythema Neck: No masses, trachea midline. No thyroid enlargement/tenderness/mass appreciated Respiratory: Normal respiratory effort. No dullness/hyper-resonance to percussion. Breath sounds normal, no wheeze/rhonchi/rales Cardiovascular: S1/S2 normal, no murmur/rub/gallop auscultated. No carotid bruit or JVD. No abdominal aortic bruit. Pedal pulse II/IV bilaterally DP and PT. No lower extremity edema.   No results found for this or any previous visit (from the past 72 hour(s)).    ASSESSMENT/PLAN:  Acute pansinusitis, recurrence not specified - Plan: fluticasone (FLONASE) 50 MCG/ACT nasal spray, amoxicillin-clavulanate (AUGMENTIN) 875-125 MG per tablet  RTC if no improvement.

## 2014-12-25 ENCOUNTER — Other Ambulatory Visit: Payer: Self-pay | Admitting: Physician Assistant

## 2015-01-24 ENCOUNTER — Other Ambulatory Visit: Payer: Self-pay | Admitting: Physician Assistant

## 2015-01-24 DIAGNOSIS — F329 Major depressive disorder, single episode, unspecified: Secondary | ICD-10-CM

## 2015-01-24 DIAGNOSIS — F419 Anxiety disorder, unspecified: Secondary | ICD-10-CM

## 2015-01-24 DIAGNOSIS — F32A Depression, unspecified: Secondary | ICD-10-CM

## 2015-02-25 ENCOUNTER — Ambulatory Visit: Payer: BLUE CROSS/BLUE SHIELD | Admitting: Sports Medicine

## 2015-03-22 ENCOUNTER — Ambulatory Visit (INDEPENDENT_AMBULATORY_CARE_PROVIDER_SITE_OTHER): Payer: BLUE CROSS/BLUE SHIELD | Admitting: Sports Medicine

## 2015-03-22 ENCOUNTER — Ambulatory Visit (INDEPENDENT_AMBULATORY_CARE_PROVIDER_SITE_OTHER): Payer: BLUE CROSS/BLUE SHIELD

## 2015-03-22 VITALS — BP 114/79 | HR 96 | Resp 18 | Wt 142.6 lb

## 2015-03-22 DIAGNOSIS — M542 Cervicalgia: Secondary | ICD-10-CM | POA: Diagnosis not present

## 2015-03-22 DIAGNOSIS — M5412 Radiculopathy, cervical region: Secondary | ICD-10-CM | POA: Diagnosis not present

## 2015-03-22 DIAGNOSIS — M5030 Other cervical disc degeneration, unspecified cervical region: Secondary | ICD-10-CM | POA: Insufficient documentation

## 2015-03-22 MED ORDER — CYCLOBENZAPRINE HCL 10 MG PO TABS: ORAL_TABLET | ORAL | Status: DC

## 2015-03-22 MED ORDER — PREDNISONE 50 MG PO TABS: ORAL_TABLET | ORAL | Status: DC

## 2015-03-22 NOTE — Progress Notes (Signed)
   Subjective:    I'm seeing this patient as a consultation for:  Tandy GawJade Breeback, PA-C  CC: Right shoulder pain  HPI: This is a pleasant 35 year old female, she recently had a subacromial decompression, got moderate improvement. Unfortunately starting to have a new pain that she localizes from the right side of her neck radiation to the elbow laterally, and around the scapula medially. Pain is not really exacerbated with shoulder or arm positioning. Moderate, persistent. No lower extremity symptoms, no bowel or bladder dysfunction, no trauma, no constitutional symptoms.  Past medical history, Surgical history, Family history not pertinant except as noted below, Social history, Allergies, and medications have been entered into the medical record, reviewed, and no changes needed.   Review of Systems: No headache, visual changes, nausea, vomiting, diarrhea, constipation, dizziness, abdominal pain, skin rash, fevers, chills, night sweats, weight loss, swollen lymph nodes, body aches, joint swelling, muscle aches, chest pain, shortness of breath, mood changes, visual or auditory hallucinations.   Objective:   General: Well Developed, well nourished, and in no acute distress.  Neuro/Psych: Alert and oriented x3, extra-ocular muscles intact, able to move all 4 extremities, sensation grossly intact. Skin: Warm and dry, no rashes noted.  Respiratory: Not using accessory muscles, speaking in full sentences, trachea midline.  Cardiovascular: Pulses palpable, no extremity edema. Abdomen: Does not appear distended. Right Shoulder: Inspection reveals no abnormalities, atrophy or asymmetry. Palpation is normal with no tenderness over AC joint or bicipital groove. ROM is full in all planes. Rotator cuff strength normal throughout, only minimal pain with testing of the supraspinatus, this pain is non-concordant with what she has been feeling. No signs of impingement with negative Neer and Hawkin's tests,  empty can. Speeds and Yergason's tests normal. No labral pathology noted with negative Obrien's, negative crank, negative clunk, and good stability. Normal scapular function observed. No painful arc and no drop arm sign. No apprehension sign Neck: Positive Spurling sign Full neck range of motion Grip strength and sensation normal in bilateral hands Strength good C4 to T1 distribution No sensory change to C4 to T1 Reflexes normal  Impression and Recommendations:   This case required medical decision making of moderate complexity.

## 2015-03-22 NOTE — Assessment & Plan Note (Signed)
Home rehabilitation, x-rays, prednisone, Flexeril. Return in one month, MRI for interventional planning if no better. Shoulder exam was fairly unremarkable.

## 2015-03-30 ENCOUNTER — Ambulatory Visit (INDEPENDENT_AMBULATORY_CARE_PROVIDER_SITE_OTHER): Payer: BLUE CROSS/BLUE SHIELD | Admitting: Physician Assistant

## 2015-03-30 ENCOUNTER — Encounter: Payer: Self-pay | Admitting: Physician Assistant

## 2015-03-30 ENCOUNTER — Ambulatory Visit (INDEPENDENT_AMBULATORY_CARE_PROVIDER_SITE_OTHER): Payer: BLUE CROSS/BLUE SHIELD

## 2015-03-30 VITALS — BP 106/71 | HR 110 | Ht 68.0 in | Wt 147.0 lb

## 2015-03-30 DIAGNOSIS — F32A Depression, unspecified: Secondary | ICD-10-CM

## 2015-03-30 DIAGNOSIS — R1084 Generalized abdominal pain: Secondary | ICD-10-CM

## 2015-03-30 DIAGNOSIS — K5900 Constipation, unspecified: Secondary | ICD-10-CM | POA: Insufficient documentation

## 2015-03-30 DIAGNOSIS — F419 Anxiety disorder, unspecified: Secondary | ICD-10-CM

## 2015-03-30 DIAGNOSIS — Z720 Tobacco use: Secondary | ICD-10-CM | POA: Diagnosis not present

## 2015-03-30 DIAGNOSIS — F329 Major depressive disorder, single episode, unspecified: Secondary | ICD-10-CM

## 2015-03-30 LAB — CBC WITH DIFFERENTIAL/PLATELET
BASOS ABS: 0.1 10*3/uL (ref 0.0–0.1)
Basophils Relative: 1 % (ref 0–1)
EOS PCT: 5 % (ref 0–5)
Eosinophils Absolute: 0.4 10*3/uL (ref 0.0–0.7)
HCT: 44.4 % (ref 36.0–46.0)
Hemoglobin: 15.2 g/dL — ABNORMAL HIGH (ref 12.0–15.0)
Lymphocytes Relative: 28 % (ref 12–46)
Lymphs Abs: 2.5 10*3/uL (ref 0.7–4.0)
MCH: 31.7 pg (ref 26.0–34.0)
MCHC: 34.2 g/dL (ref 30.0–36.0)
MCV: 92.5 fL (ref 78.0–100.0)
MONO ABS: 0.6 10*3/uL (ref 0.1–1.0)
MPV: 10.8 fL (ref 8.6–12.4)
Monocytes Relative: 7 % (ref 3–12)
Neutro Abs: 5.2 10*3/uL (ref 1.7–7.7)
Neutrophils Relative %: 59 % (ref 43–77)
Platelets: 227 10*3/uL (ref 150–400)
RBC: 4.8 MIL/uL (ref 3.87–5.11)
RDW: 13.6 % (ref 11.5–15.5)
WBC: 8.8 10*3/uL (ref 4.0–10.5)

## 2015-03-30 MED ORDER — LINACLOTIDE 290 MCG PO CAPS: 290.0000 ug | ORAL_CAPSULE | Freq: Every day | ORAL | Status: DC

## 2015-03-30 MED ORDER — VARENICLINE TARTRATE 0.5 MG X 11 & 1 MG X 42 PO MISC: ORAL | Status: DC

## 2015-03-30 MED ORDER — BUPROPION HCL ER (XL) 150 MG PO TB24: 150.0000 mg | ORAL_TABLET | Freq: Every day | ORAL | Status: DC

## 2015-03-30 NOTE — Progress Notes (Signed)
   Subjective:    Patient ID: Casey Sweeney, female    DOB: June 24, 1980, 35 y.o.   MRN: [id number]  HPI  Patient is a 35 year old female who presents to the clinic with ongoing problems with constipation. Over the last couple months she feels like her constipation has worsened. She usually has a bowel movement every 3-5 days that are hard. She started feeling a little more abdominal pain this weekend. She used a glycerin suppository and had a bowel movement Sunday, 4 days ago. It was hard. She has not had a bowel movement since. Denies any melena or hematochezia. She just feels "ugh". She has no energy. She has some nausea but no vomiting.   Patient is also having more anxiety. She's been off her Zoloft for 6 months. The 25 mg dose did not seem to be helping and when she went up to 50 mg it just made her feel weird. She feels like smoking and being on antidepressants increases her anxiety. She does want to quit smoking but she is scared to try. She does find herself one between anxiousness and crying. She feels like she does need to be on something for her mood. She denies any suicidal or homicidal thoughts.   Review of Systems  All other systems reviewed and are negative.      Objective:   Physical Exam  Constitutional: She is oriented to person, place, and time. She appears well-developed and well-nourished.  HENT:  Head: Normocephalic and atraumatic.  Eyes: Conjunctivae are normal. Right eye exhibits no discharge. Left eye exhibits no discharge.  Neck: Normal range of motion. Neck supple.  Cardiovascular: Normal rate, regular rhythm and normal heart sounds.   Pulmonary/Chest: Effort normal and breath sounds normal.  Abdominal: Soft. Bowel sounds are normal.  Normal bowel sounds.  Generalized diffuse pain with palpation. No guarding or rebound. No masses.   Lymphadenopathy:    She has no cervical adenopathy.  Neurological: She is alert and oriented to person, place, and time.   Psychiatric: She has a normal mood and affect. Her behavior is normal.          Assessment & Plan:  Constipation/generalized abdominal pain- I didn't want to get an abdominal x-ray to rule out small bowel obstruction. I do think that is less likely however patient is having some abdominal pain and has not had a bowel movement in the last 4 days. abdominal xray was positive for constipation. Patient was called and can start linzess daily. She should have bowel movement in 48 hours.   Tobacco abuse- discussed chantix. Coupon given. Side effects discussed. Encouraged pt to stop within first month and follow up to get 2 more refills.   Anxiety/depression- wellbutrin started. Discussed side effects. Follow up in 4-6 weeks. She was previously on zoloft but  didn't work and 50 made her feel weird.

## 2015-05-06 ENCOUNTER — Other Ambulatory Visit: Payer: Self-pay | Admitting: Physician Assistant

## 2015-05-06 MED ORDER — ERYTHROMYCIN 5 MG/GM OP OINT: 1.0000 "application " | TOPICAL_OINTMENT | Freq: Three times a day (TID) | OPHTHALMIC | Status: AC

## 2015-05-06 NOTE — Progress Notes (Signed)
Pt comes in with daughter and has sty given emycin ointment to use with warm compresses.

## 2015-05-24 ENCOUNTER — Encounter: Payer: Self-pay | Admitting: Physician Assistant

## 2015-05-24 ENCOUNTER — Ambulatory Visit (INDEPENDENT_AMBULATORY_CARE_PROVIDER_SITE_OTHER): Payer: BLUE CROSS/BLUE SHIELD | Admitting: Physician Assistant

## 2015-05-24 VITALS — BP 131/77 | HR 101 | Ht 68.0 in | Wt 143.0 lb

## 2015-05-24 DIAGNOSIS — F17200 Nicotine dependence, unspecified, uncomplicated: Secondary | ICD-10-CM | POA: Diagnosis not present

## 2015-05-24 DIAGNOSIS — Z23 Encounter for immunization: Secondary | ICD-10-CM

## 2015-05-24 DIAGNOSIS — S60812A Abrasion of left wrist, initial encounter: Secondary | ICD-10-CM | POA: Diagnosis not present

## 2015-05-24 DIAGNOSIS — S76012A Strain of muscle, fascia and tendon of left hip, initial encounter: Secondary | ICD-10-CM

## 2015-05-24 MED ORDER — KETOROLAC TROMETHAMINE 60 MG/2ML IM SOLN
60.0000 mg | Freq: Once | INTRAMUSCULAR | Status: AC
  Administered 2015-05-24: 60 mg via INTRAMUSCULAR

## 2015-05-24 NOTE — Addendum Note (Signed)
Addended by: Donne AnonBENDER, Malie Kashani L on: 05/24/2015 10:39 AM   Modules accepted: Orders

## 2015-05-24 NOTE — Patient Instructions (Signed)
Take one 0.5mg  tablet by mouth once daily for 3 days, then increase to one 0.5mg  tablet twice daily for 3 days, then increase to one 1mg  tablet twice daily  Biofreeze.

## 2015-05-24 NOTE — Progress Notes (Signed)
   Subjective:    Patient ID: Casey Sweeney, female    DOB: 10/28/1980, 35 y.o.   MRN: [id number]  HPI Pt is a 35 yo female who presents to the clinic with left upper leg pain since Saturday night after having "rough sex" and starting a remodeling project at home. She did not necessarily have much pain until Sunday morning. She is icing and taking mobic which has helped and she feels better but she wants to make sure doing everything right. Pain is 3-4/10 with movement. Worse when trying to get out of car and stand up.   She would like to restart chantix for smoking cessation. She stopped chantix after diarrhea and nausea when starting chantix and linzess at the same time.   She does have a small abrasion of left wrist from renovation. She does not know when last Tetanus shot was given. She has kept clean with water and soap. No concerns.    Review of Systems  All other systems reviewed and are negative.      Objective:   Physical Exam  Constitutional: She is oriented to person, place, and time. She appears well-developed and well-nourished.  Musculoskeletal:  Tenderness to palpation over muscles of inner thigh and groin area. Seems to be located more over hip flexor and gracilis.  Pain with resistance of left leg abduction and adduction.  No pain over left greater trochanter.  Pain to lift left leg and to stand.   Neurological: She is alert and oriented to person, place, and time.  Skin:  1 inch abrasion healing well of left lateral wrist. No discharge or increased redness.   Psychiatric: She has a normal mood and affect. Her behavior is normal.          Assessment & Plan:  Hip flexor strain, left/gracilis strain left- given home exercises. Discussed ice and biofreeze. toradol 60mg  Im given today. Start NSAID as needed tomorrow. Be careful with use of these muscles during sex and renovation.   Abrasion left wrist- healing well. Discussed signs of infection. Tdap given today.    Tobacco dependence- restart chantix. If develop diarrhea and nausea again will add to intolerance list. i likely think more due to linzess start than chantix.

## 2015-06-01 ENCOUNTER — Other Ambulatory Visit: Payer: Self-pay | Admitting: Physician Assistant

## 2015-06-03 ENCOUNTER — Other Ambulatory Visit: Payer: Self-pay | Admitting: Physician Assistant

## 2015-06-03 MED ORDER — AZITHROMYCIN 250 MG PO TABS: ORAL_TABLET | ORAL | Status: DC

## 2015-06-03 NOTE — Progress Notes (Signed)
Pt has been sick for over 1 month. Sinus pressure, drainage, cough, puffy around eyes. No SOB or fever.   Will treat for sinus infection.

## 2015-06-06 ENCOUNTER — Ambulatory Visit (INDEPENDENT_AMBULATORY_CARE_PROVIDER_SITE_OTHER): Payer: BLUE CROSS/BLUE SHIELD | Admitting: Physician Assistant

## 2015-06-06 ENCOUNTER — Encounter: Payer: Self-pay | Admitting: Physician Assistant

## 2015-06-06 VITALS — BP 134/88 | HR 94 | Temp 98.4°F | Ht 68.0 in | Wt 140.0 lb

## 2015-06-06 DIAGNOSIS — J101 Influenza due to other identified influenza virus with other respiratory manifestations: Secondary | ICD-10-CM | POA: Diagnosis not present

## 2015-06-06 DIAGNOSIS — R52 Pain, unspecified: Secondary | ICD-10-CM | POA: Diagnosis not present

## 2015-06-06 DIAGNOSIS — R509 Fever, unspecified: Secondary | ICD-10-CM

## 2015-06-06 LAB — POCT INFLUENZA A/B
Influenza A, POC: POSITIVE — AB
Influenza B, POC: NEGATIVE

## 2015-06-06 NOTE — Progress Notes (Signed)
   Subjective:    Patient ID: Casey Sweeney, female    DOB: 04/24/1980, 35 y.o.   MRN: [id number]  HPI Pt is a 35 yo female who presents to the clinic with flu like symptoms. She is having fever, cough, chills, headache and extreme fatigue. She has not gotten out of bed in 2 days. Symptoms started Saturday morning 3 days ago. She has had some nausea but no vomiting. No diarrhea, abdominal pain or flank pain. She was given zpak Friday for sinus infection. She is taking ibuprofen and tylenol. Fever has been as high as 102.    Review of Systems  All other systems reviewed and are negative.      Objective:   Physical Exam  Constitutional:  Lethargic and laying on exam table when entered room.  HENT:  Head: Normocephalic and atraumatic.  Right Ear: External ear normal.  Left Ear: External ear normal.  Nose: Nose normal.  Mouth/Throat: Oropharynx is clear and moist. No oropharyngeal exudate.  Negative for any sinus pressure.  TM's normal.   Eyes: Conjunctivae are normal. Right eye exhibits no discharge. Left eye exhibits no discharge.  Neck: Normal range of motion. Neck supple.  Cardiovascular: Normal rate, regular rhythm and normal heart sounds.   Pulmonary/Chest: Effort normal and breath sounds normal. She has no wheezes.  Lymphadenopathy:    She has no cervical adenopathy.  Psychiatric: She has a normal mood and affect. Her behavior is normal.          Assessment & Plan:  Influenza A- positive via rapid test. Outside window for tamiflu. Discussed symptomatic care. May return to work 24 hours after fever free. Stay hydrated and rest.   Pt request that her children be sent preventative who do not have symptoms. Her 2 sons both started with fever today and feeling achy. I will send over treatment tamiflu for boys.

## 2015-06-06 NOTE — Patient Instructions (Signed)

## 2015-06-07 ENCOUNTER — Emergency Department (INDEPENDENT_AMBULATORY_CARE_PROVIDER_SITE_OTHER): Payer: BLUE CROSS/BLUE SHIELD

## 2015-06-07 ENCOUNTER — Encounter: Payer: Self-pay | Admitting: *Deleted

## 2015-06-07 ENCOUNTER — Emergency Department
Admission: EM | Admit: 2015-06-07 | Discharge: 2015-06-07 | Disposition: A | Discharge status: Home / Self Care | Payer: BLUE CROSS/BLUE SHIELD | Source: Home / Self Care | Attending: Family Medicine | Admitting: Family Medicine

## 2015-06-07 DIAGNOSIS — R0602 Shortness of breath: Secondary | ICD-10-CM

## 2015-06-07 DIAGNOSIS — J111 Influenza due to unidentified influenza virus with other respiratory manifestations: Secondary | ICD-10-CM

## 2015-06-07 DIAGNOSIS — R079 Chest pain, unspecified: Secondary | ICD-10-CM | POA: Diagnosis not present

## 2015-06-07 MED ORDER — PREDNISONE 20 MG PO TABS: ORAL_TABLET | ORAL | Status: DC

## 2015-06-07 MED ORDER — GUAIFENESIN-CODEINE 100-10 MG/5ML PO SOLN: ORAL | Status: DC

## 2015-06-07 NOTE — ED Notes (Signed)
Pt tested + for flu A yesterday @ PCP and c/o sob. Today she reports her SOB is worse and skin intermittently turns purple then returns to normal color. She has used inhaler written by PCP without relief. No wheezing.

## 2015-06-07 NOTE — Discharge Instructions (Signed)
Take plain guaifenesin (1200mg  extended release tabs such as Mucinex) twice daily, with plenty of water, for cough and congestion.  May add Pseudoephedrine (30mg , one or two every 4 to 6 hours) for sinus congestion.  Get adequate rest.   Stop all antihistamines for now, and other non-prescription cough/cold preparations. Continue albuterol inhaler as needed.  Follow-up with family doctor if not improving about one week.

## 2015-06-07 NOTE — ED Provider Notes (Signed)
CSN: [id number]     Arrival date & time 06/07/15  1621 History   First MD Initiated Contact with Patient 06/07/15 1641     Chief Complaint  Patient presents with  . Shortness of Breath      HPI Comments: Complains of 4 day history flu-like illness including myalgias, headache, fever/chills, fatigue, shortness of breath, and cough.  She visited her PCP yesterday and flu test positive for influenza A, and she was prescribed Tamiflu.  She complains of persistent shortness of breath and chest tightness without much improvement from an albuterol inhaler. She denies fever today. She has a family history of asthma (sister and nephew)  The history is provided by the patient.    Past Medical History  Diagnosis Date  . Anxiety   . Depression   . Irritable bowel syndrome   . Kidney stones   . History of recurrent UTIs   . Headache(784.0)     migraines   Past Surgical History  Procedure Laterality Date  . No past surgeries    . Cholecystectomy  10/17/12    lap chole  . Cholecystectomy N/A 10/17/2012    Procedure: LAPAROSCOPIC CHOLECYSTECTOMY;  Surgeon: Axel FillerArmando Ramirez, MD;  [facility];  Service: General;  Laterality: N/A;   Family History  Problem Relation Age of Onset  . Cancer Other 80    unknown cancer  . Cancer Maternal Grandfather     unknown  . Hypertension Mother   . Cancer Paternal Aunt     breast   Social History  Substance Use Topics  . Smoking status: Current Every Day Smoker -- 1.00 packs/day for 10 years    Types: Cigarettes  . Smokeless tobacco: Never Used  . Alcohol Use: No   OB History    Gravida Para Term Preterm AB TAB SAB Ectopic Multiple Living   5 4 3 1      4      Review of Systems No sore throat + cough ? pleuritic pain; has tightness in anterior chest + wheezing + nasal congestion + post-nasal drainage No sinus pain/pressure No itchy/red eyes No earache No hemoptysis + SOB + fever, + chills No nausea No vomiting No abdominal pain No  diarrhea No urinary symptoms No skin rash + fatigue + myalgias + headache Used OTC meds without relief  Allergies  Review of patient's allergies indicates no known allergies.  Home Medications   Prior to Admission medications   Medication Sig Start Date End Date Taking? Authorizing Provider  azithromycin (ZITHROMAX) 250 MG tablet Take 2 tablets now and then one tablet for 4 days. 06/03/15   Jade L Breeback, PA-C  buPROPion (WELLBUTRIN XL) 150 MG 24 hr tablet TAKE 1 TABLET (150 MG TOTAL) BY MOUTH DAILY. 06/01/15   Jade L Breeback, PA-C  guaiFENesin-codeine 100-10 MG/5ML syrup Take 10mL by mouth at bedtime as needed for cough 06/07/15   Lattie HawStephen A Sarvesh Meddaugh, MD  LINZESS 290 MCG CAPS capsule TAKE 1 CAPSULE (290 MCG TOTAL) BY MOUTH DAILY. 06/01/15   Jade L Breeback, PA-C  predniSONE (DELTASONE) 20 MG tablet Take one tab by mouth twice daily for 5 days, then one daily for 3 days. Take with food. 06/07/15   Lattie HawStephen A Cori Justus, MD   Meds Ordered and Administered this Visit  Medications - No data to display  BP 118/89 mmHg  Pulse 117  Temp(Src) 98.2 F (36.8 C) (Oral)  Resp 24  SpO2 98% No data found.   Physical Exam Nursing notes and  Vital Signs reviewed. Appearance:  Patient appears stated age, and in no acute distress.  She is alert and oriented.  Eyes:  Pupils are equal, round, and reactive to light and accomodation.  Extraocular movement is intact.  Conjunctivae are not inflamed  Ears:  Canals normal.  Tympanic membranes normal.  Nose:  Mildly congested turbinates.  No sinus tenderness.   Pharynx:  Normal Neck:  Supple.  Tender enlarged posterior/lateral nodes are palpated bilaterally  Lungs:  Clear to auscultation.  Breath sounds are equal.  Moving air well. Chest:  Distinct tenderness to palpation over the mid-sternum.  Heart:  Regular rate and rhythm without murmurs, rubs, or gallops.  Abdomen:  Nontender without masses or hepatosplenomegaly.  Bowel sounds are present.  No CVA or flank  tenderness.  Extremities:  No edema.  Skin:  No rash present.   ED Course  Procedures  None  Imaging Review Dg Chest 2 View  06/07/2015  CLINICAL DATA:  Shortness of breath and chest pain. EXAM: CHEST  2 VIEW COMPARISON:  None. FINDINGS: Normal heart size. Normal mediastinal contour. No pneumothorax. No pleural effusion. Lungs appear clear, with no acute consolidative airspace disease and no pulmonary edema. IMPRESSION: No active cardiopulmonary disease. Electronically Signed   By: Delbert Phenix M.D.   On: 06/07/2015 17:24     MDM   1. Influenza    Negative chest X-ray and SPO2 98% reassuring.  Patient receiving appropriate treatment for influenza. Begin prednisone burst/taper for bronchospasm and costochondritis Rx for Robitussin AC for night time cough.  Take plain guaifenesin (  extended release tabs such as Mucinex) twice daily, with plenty of water, for cough and congestion.  May add Pseudoephedrine ( , one or two every 4 to 6 hours) for sinus congestion.  Get adequate rest.   Stop all antihistamines for now, and other non-prescription cough/cold preparations. Continue albuterol inhaler as needed.  Follow-up with family doctor if not improving about one week.    Lattie Haw, MD 06/14/15 1030

## 2015-07-29 ENCOUNTER — Ambulatory Visit (INDEPENDENT_AMBULATORY_CARE_PROVIDER_SITE_OTHER): Payer: BLUE CROSS/BLUE SHIELD | Admitting: Physician Assistant

## 2015-07-29 ENCOUNTER — Encounter: Payer: Self-pay | Admitting: Physician Assistant

## 2015-07-29 VITALS — BP 90/58 | HR 86 | Temp 98.3°F | Ht 68.0 in | Wt 149.0 lb

## 2015-07-29 DIAGNOSIS — J0140 Acute pansinusitis, unspecified: Secondary | ICD-10-CM | POA: Diagnosis not present

## 2015-07-29 MED ORDER — AZITHROMYCIN 250 MG PO TABS: ORAL_TABLET | ORAL | Status: DC

## 2015-07-29 MED ORDER — METHYLPREDNISOLONE 4 MG PO TBPK: ORAL_TABLET | ORAL | Status: DC

## 2015-07-29 NOTE — Patient Instructions (Signed)

## 2015-07-29 NOTE — Progress Notes (Signed)
   Subjective:    Patient ID: Casey Sweeney, female    DOB: 08/20/1980, 35 y.o.   MRN: [id number]  HPI Pt is a 35 yo female who presents to the clinic with 2 weeks of sinus pressure and left ear pain. No fever, chills, body aches. Has allergies and taking allegra. She has been out for 2-3 weeks. No SOB, cough. Taking sudafed, mucinex with some relief. Hx of sinus infections.    Review of Systems  All other systems reviewed and are negative.      Objective:   Physical Exam  Constitutional: She is oriented to person, place, and time. She appears well-developed and well-nourished.  HENT:  Head: Normocephalic and atraumatic.  Right Ear: External ear normal.  Left Ear: External ear normal.  Mouth/Throat: Oropharynx is clear and moist. No oropharyngeal exudate.  Left TM bulging with fluid behind TM. No blood or pus.  Right TM clear.  Tenderness over maxillary and frontal sinuses.  Nasal turbinates red and swollen.   Eyes: Conjunctivae are normal. Right eye exhibits no discharge. Left eye exhibits no discharge.  Neck: Normal range of motion. Neck supple.  Cardiovascular: Normal rate, regular rhythm and normal heart sounds.   Pulmonary/Chest: Effort normal and breath sounds normal.  Lymphadenopathy:    She has no cervical adenopathy.  Neurological: She is alert and oriented to person, place, and time.  Psychiatric: She has a normal mood and affect. Her behavior is normal.          Assessment & Plan:  Acute pansinusitis/seasonal allergies.- treated with zpak and medrol dose pack. Continue allegra and flonase. Follow up as needed.

## 2016-02-01 ENCOUNTER — Ambulatory Visit (INDEPENDENT_AMBULATORY_CARE_PROVIDER_SITE_OTHER): Payer: BLUE CROSS/BLUE SHIELD

## 2016-02-01 ENCOUNTER — Ambulatory Visit (INDEPENDENT_AMBULATORY_CARE_PROVIDER_SITE_OTHER): Payer: BLUE CROSS/BLUE SHIELD | Admitting: Sports Medicine

## 2016-02-01 DIAGNOSIS — M7752 Other enthesopathy of left foot: Secondary | ICD-10-CM | POA: Diagnosis not present

## 2016-02-01 DIAGNOSIS — M25572 Pain in left ankle and joints of left foot: Secondary | ICD-10-CM

## 2016-02-01 DIAGNOSIS — M79672 Pain in left foot: Secondary | ICD-10-CM | POA: Diagnosis not present

## 2016-02-01 NOTE — Assessment & Plan Note (Signed)
X-rays, MRI. Has failed several months of conservative measures.

## 2016-02-01 NOTE — Progress Notes (Signed)
  Subjective:    CC: Left great toe pain  HPI: We have been treating Casey Sweeney for her ingrown toenails, despite phenol she has grown a toenail back. This is really not symptom at, however she continues to have pain originating at the metatarsophalangeal joint with radiation over the entire foot. Severe, persistent, despite multiple oral medications and greater than 6 weeks of physician directed conservative measures.  Past medical history:  Negative.  See flowsheet/record as well for more information.  Surgical history: Negative.  See flowsheet/record as well for more information.  Family history: Negative.  See flowsheet/record as well for more information.  Social history: Negative.  See flowsheet/record as well for more information.  Allergies, and medications have been entered into the medical record, reviewed, and no changes needed.   Review of Systems: No fevers, chills, night sweats, weight loss, chest pain, or shortness of breath.   Objective:    General: Well Developed, well nourished, and in no acute distress.  Neuro: Alert and oriented x3, extra-ocular muscles intact, sensation grossly intact.  HEENT: Normocephalic, atraumatic, pupils equal round reactive to light, neck supple, no masses, no lymphadenopathy, thyroid nonpalpable.  Skin: Warm and dry, no rashes. Cardiac: Regular rate and rhythm, no murmurs rubs or gallops, no lower extremity edema.  Respiratory: Clear to auscultation bilaterally. Not using accessory muscles, speaking in full sentences. Left Foot: No visible erythema or swelling. Range of motion is full in all directions. Strength is 5/5 in all directions. No hallux valgus. No pes cavus or pes planus. No abnormal callus noted. No pain over the navicular prominence, or base of fifth metatarsal. No tenderness to palpation of the calcaneal insertion of plantar fascia. No pain at the Achilles insertion. No pain over the calcaneal bursa. No pain of the  retrocalcaneal bursa. No tenderness to palpation over the tarsals, metatarsals, or phalanges. No hallux rigidus or limitus. Tender to palpation at the first metatarsophalangeal joint No pain with compression of the metatarsal heads. Neurovascularly intact distally.  Impression and Recommendations:    Capsulitis of metatarsophalangeal (MTP) joint of left foot X-rays, MRI. Has failed several months of conservative measures.

## 2016-02-06 ENCOUNTER — Ambulatory Visit (INDEPENDENT_AMBULATORY_CARE_PROVIDER_SITE_OTHER): Payer: BLUE CROSS/BLUE SHIELD

## 2016-02-06 DIAGNOSIS — M25572 Pain in left ankle and joints of left foot: Secondary | ICD-10-CM | POA: Diagnosis not present

## 2016-02-06 DIAGNOSIS — M79672 Pain in left foot: Secondary | ICD-10-CM | POA: Diagnosis not present

## 2016-02-08 ENCOUNTER — Ambulatory Visit (INDEPENDENT_AMBULATORY_CARE_PROVIDER_SITE_OTHER): Payer: BLUE CROSS/BLUE SHIELD | Admitting: Sports Medicine

## 2016-02-08 ENCOUNTER — Encounter: Payer: Self-pay | Admitting: Sports Medicine

## 2016-02-08 DIAGNOSIS — M7752 Other enthesopathy of left foot: Secondary | ICD-10-CM

## 2016-02-08 DIAGNOSIS — M5412 Radiculopathy, cervical region: Secondary | ICD-10-CM

## 2016-02-08 MED ORDER — PREDNISONE 50 MG PO TABS: ORAL_TABLET | ORAL | 0 refills | Status: DC

## 2016-02-08 MED ORDER — TERBINAFINE HCL 250 MG PO TABS: 250.0000 mg | ORAL_TABLET | Freq: Every day | ORAL | 1 refills | Status: DC

## 2016-02-08 NOTE — Assessment & Plan Note (Signed)
MRI is negative, she does have what appears to be onychomycosis of the left great toenail which is thickened and yellowed. Lamisil for 6 months.

## 2016-02-08 NOTE — Assessment & Plan Note (Signed)
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 Return for MRI results.

## 2016-02-08 NOTE — Progress Notes (Signed)
  Subjective:    CC: MRI results  HPI: Left foot pain: We suspected Slightest of the first MTP, MRI was completely negative, on further questioning and evaluation her left great toenail has become very thickened and yellowed, we have removed once. She has pain over the tip of the toenail, medial nail fold all the way to the MTP.  Right arm numbness and tingling: Has failed conservative measures, x-ray showed multilevel degenerative changes. Agreeable to proceed with MRI.  Past medical history:  Negative.  See flowsheet/record as well for more information.  Surgical history: Negative.  See flowsheet/record as well for more information.  Family history: Negative.  See flowsheet/record as well for more information.  Social history: Negative.  See flowsheet/record as well for more information.  Allergies, and medications have been entered into the medical record, reviewed, and no changes needed.   Review of Systems: No fevers, chills, night sweats, weight loss, chest pain, or shortness of breath.   Objective:    General: Well Developed, well nourished, and in no acute distress.  Neuro: Alert and oriented x3, extra-ocular muscles intact, sensation grossly intact.  HEENT: Normocephalic, atraumatic, pupils equal round reactive to light, neck supple, no masses, no lymphadenopathy, thyroid nonpalpable.  Skin: Warm and dry, no rashes. Cardiac: Regular rate and rhythm, no murmurs rubs or gallops, no lower extremity edema.  Respiratory: Clear to auscultation bilaterally. Not using accessory muscles, speaking in full sentences. Left foot: Left first toenail is thickened, yellow. Range of motion is full in all directions. Strength is 5/5 in all directions. No hallux valgus. No pes cavus or pes planus. No abnormal callus noted. No pain over the navicular prominence, or base of fifth metatarsal. No tenderness to palpation of the calcaneal insertion of plantar fascia. No pain at the Achilles  insertion. No pain over the calcaneal bursa. No pain of the retrocalcaneal bursa. No tenderness to palpation over the tarsals, metatarsals, or phalanges. No hallux rigidus or limitus. No tenderness palpation over interphalangeal joints. No pain with compression of the metatarsal heads. Neurovascularly intact distally.  Impression and Recommendations:    Capsulitis of metatarsophalangeal (MTP) joint of left foot MRI is negative, she does have what appears to be onychomycosis of the left great toenail which is thickened and yellowed. Lamisil for 6 months.  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 Return for MRI results.

## 2016-02-13 ENCOUNTER — Ambulatory Visit (INDEPENDENT_AMBULATORY_CARE_PROVIDER_SITE_OTHER): Payer: BLUE CROSS/BLUE SHIELD

## 2016-02-13 DIAGNOSIS — M4802 Spinal stenosis, cervical region: Secondary | ICD-10-CM

## 2016-02-13 DIAGNOSIS — M2578 Osteophyte, vertebrae: Secondary | ICD-10-CM

## 2016-02-13 DIAGNOSIS — M4603 Spinal enthesopathy, cervicothoracic region: Secondary | ICD-10-CM | POA: Diagnosis not present

## 2016-02-13 DIAGNOSIS — M50321 Other cervical disc degeneration at C4-C5 level: Secondary | ICD-10-CM | POA: Diagnosis not present

## 2016-02-13 DIAGNOSIS — M4602 Spinal enthesopathy, cervical region: Secondary | ICD-10-CM | POA: Diagnosis not present

## 2016-02-13 DIAGNOSIS — M5412 Radiculopathy, cervical region: Secondary | ICD-10-CM

## 2016-02-16 ENCOUNTER — Ambulatory Visit: Payer: BLUE CROSS/BLUE SHIELD | Admitting: Sports Medicine

## 2016-02-17 ENCOUNTER — Ambulatory Visit (INDEPENDENT_AMBULATORY_CARE_PROVIDER_SITE_OTHER): Payer: BLUE CROSS/BLUE SHIELD | Admitting: Sports Medicine

## 2016-02-17 ENCOUNTER — Encounter: Payer: Self-pay | Admitting: Sports Medicine

## 2016-02-17 DIAGNOSIS — B351 Tinea unguium: Secondary | ICD-10-CM | POA: Diagnosis not present

## 2016-02-17 DIAGNOSIS — M5412 Radiculopathy, cervical region: Secondary | ICD-10-CM

## 2016-02-17 NOTE — Assessment & Plan Note (Signed)
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 There is by foraminal stenosis left worse than right, symptoms are bilateral worse on the right.  We are going to proceed with a right-sided cervical epidural, and home rehabilitation exercises. Next line return to see me in one month, further plans afterwards.

## 2016-02-17 NOTE — Progress Notes (Signed)
  Subjective:    CC: MRI results  HPI: This is a pleasant 35 year old female, she's had right-sided upper shoulder paresthesias and periscapular paresthesias for some time now, recently obtained an MRI the results of which we dictated below, symptoms continued to be moderate, persistent. No weakness, no bowel or bladder dysfunction, saddle numbness, no constitutional symptoms.  Past medical history:  Negative.  See flowsheet/record as well for more information.  Surgical history: Negative.  See flowsheet/record as well for more information.  Family history: Negative.  See flowsheet/record as well for more information.  Social history: Negative.  See flowsheet/record as well for more information.  Allergies, and medications have been entered into the medical record, reviewed, and no changes needed.   Review of Systems: No fevers, chills, night sweats, weight loss, chest pain, or shortness of breath.   Objective:    General: Well Developed, well nourished, and in no acute distress.  Neuro: Alert and oriented x3, extra-ocular muscles intact, sensation grossly intact.  HEENT: Normocephalic, atraumatic, pupils equal round reactive to light, neck supple, no masses, no lymphadenopathy, thyroid nonpalpable.  Skin: Warm and dry, no rashes. Cardiac: Regular rate and rhythm, no murmurs rubs or gallops, no lower extremity edema.  Respiratory: Clear to auscultation bilaterally. Not using accessory muscles, speaking in full sentences.  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 There is by foraminal stenosis left worse than right, symptoms are bilateral worse on the right.  We are going to proceed with a right-sided cervical epidural, and home  rehabilitation exercises. Next line return to see me in one month, further plans afterwards.  I spent 40 minutes with this patient, greater than 50% was face-to-face time counseling regarding the above diagnoses

## 2016-02-20 ENCOUNTER — Telehealth: Payer: Self-pay

## 2016-02-20 MED ORDER — TRAMADOL HCL 50 MG PO TABS: 50.0000 mg | ORAL_TABLET | Freq: Three times a day (TID) | ORAL | 0 refills | Status: DC | PRN

## 2016-02-20 NOTE — Telephone Encounter (Signed)
Pt left VM asking if she can have something for pain. States she's been taking ibuprofen around the clock and it's not doing much. Please assist.

## 2016-02-20 NOTE — Telephone Encounter (Signed)
Very small amount of tramadol given to get her through until the epidural

## 2016-02-28 ENCOUNTER — Ambulatory Visit
Admission: RE | Admit: 2016-02-28 | Discharge: 2016-02-28 | Disposition: A | Discharge status: Home / Self Care | Payer: BLUE CROSS/BLUE SHIELD | Source: Ambulatory Visit | Attending: Sports Medicine | Admitting: Sports Medicine

## 2016-02-28 DIAGNOSIS — M47812 Spondylosis without myelopathy or radiculopathy, cervical region: Secondary | ICD-10-CM | POA: Diagnosis not present

## 2016-02-28 MED ORDER — TRIAMCINOLONE ACETONIDE 40 MG/ML IJ SUSP (RADIOLOGY)
40.0000 mg | Freq: Once | INTRAMUSCULAR | Status: AC
  Administered 2016-02-28: 60 mg via EPIDURAL

## 2016-02-28 MED ORDER — IOPAMIDOL (ISOVUE-M 300) INJECTION 61%
1.0000 mL | Freq: Once | INTRAMUSCULAR | Status: AC | PRN
  Administered 2016-02-28: 1 mL via EPIDURAL

## 2016-02-28 NOTE — Discharge Instructions (Addendum)

## 2016-03-01 ENCOUNTER — Telehealth: Payer: Self-pay | Admitting: *Deleted

## 2016-03-01 NOTE — Telephone Encounter (Signed)
Pt left vm wanting to know if you needed her to make an appointment to see you for you to give her Chantix again.  Please advise.

## 2016-03-01 NOTE — Telephone Encounter (Signed)
Ok to given chantix starter pack and follow up in one month. Goal is to taper down and stop smoking in the first month.

## 2016-03-02 ENCOUNTER — Other Ambulatory Visit: Payer: Self-pay | Admitting: *Deleted

## 2016-03-02 MED ORDER — VARENICLINE TARTRATE 0.5 MG X 11 & 1 MG X 42 PO MISC: ORAL | 0 refills | Status: DC

## 2016-03-02 NOTE — Telephone Encounter (Signed)
Chantix sent

## 2016-03-26 ENCOUNTER — Other Ambulatory Visit: Payer: Self-pay | Admitting: Sports Medicine

## 2016-03-27 ENCOUNTER — Other Ambulatory Visit: Payer: Self-pay | Admitting: Sports Medicine

## 2016-04-02 ENCOUNTER — Other Ambulatory Visit: Payer: Self-pay | Admitting: Physician Assistant

## 2016-04-05 NOTE — Telephone Encounter (Signed)
Received a refill for chantix from the pharmacy. Patient was advised last month to schedule a follow up. No follow up has been scheduled. Left message on patient's voicemail to call and schedule a follow up. Please advise.

## 2016-04-06 ENCOUNTER — Other Ambulatory Visit: Payer: Self-pay

## 2016-04-06 MED ORDER — VARENICLINE TARTRATE 1 MG PO TABS: 1.0000 mg | ORAL_TABLET | Freq: Two times a day (BID) | ORAL | 3 refills | Status: DC

## 2016-04-09 NOTE — Telephone Encounter (Signed)
Go ahead and give her one more month of maintanence but need to follow up to document cessation of smoking and tolerability of medication.

## 2016-04-10 NOTE — Telephone Encounter (Signed)
Casey Sweeney advised to follow up within one month.

## 2016-04-18 ENCOUNTER — Encounter: Payer: Self-pay | Admitting: Physician Assistant

## 2016-04-18 ENCOUNTER — Ambulatory Visit (INDEPENDENT_AMBULATORY_CARE_PROVIDER_SITE_OTHER): Payer: BLUE CROSS/BLUE SHIELD | Admitting: Physician Assistant

## 2016-04-18 VITALS — BP 119/67 | HR 111 | Ht 68.0 in | Wt 151.0 lb

## 2016-04-18 DIAGNOSIS — Z566 Other physical and mental strain related to work: Secondary | ICD-10-CM

## 2016-04-18 DIAGNOSIS — Z720 Tobacco use: Secondary | ICD-10-CM

## 2016-04-18 DIAGNOSIS — F439 Reaction to severe stress, unspecified: Secondary | ICD-10-CM

## 2016-04-18 DIAGNOSIS — Z716 Tobacco abuse counseling: Secondary | ICD-10-CM | POA: Diagnosis not present

## 2016-04-18 MED ORDER — BUPROPION HCL ER (SR) 150 MG PO TB12: 150.0000 mg | ORAL_TABLET | Freq: Two times a day (BID) | ORAL | 1 refills | Status: DC

## 2016-04-18 NOTE — Progress Notes (Signed)
   Subjective:    Patient ID: Casey Sweeney, female    DOB: 11/07/1980, 36 y.o.   MRN: [id number]  HPI  Pt presents to the clinic for smoking cessation follow up. She has been taking chantix for 1 month and stopped smoking for 7 days. She is doing good. She still has urges to smoke. She is really stressed at work and with 4 kids. Her job is demanded and just "feels like she is not good at anything". She denies any suicidal thoughts. She does notice that she is replacing food with cigarette smoking.     Review of Systems  All other systems reviewed and are negative.      Objective:   Physical Exam  Constitutional: She is oriented to person, place, and time. She appears well-developed and well-nourished.  HENT:  Head: Normocephalic and atraumatic.  Cardiovascular: Normal rate, regular rhythm and normal heart sounds.   Pulmonary/Chest: Effort normal and breath sounds normal.  Neurological: She is alert and oriented to person, place, and time.  Psychiatric: She has a normal mood and affect. Her behavior is normal.          Assessment & Plan:  Marland Kitchen.Marland Kitchen.Diagnoses and all orders for this visit:  Encounter for smoking cessation counseling -     buPROPion (WELLBUTRIN SR) 150 MG 12 hr tablet; Take 1 tablet (150 mg total) by mouth 2 (two) times daily.  Stress at work -     buPROPion [facility](WELLBUTRIN SR) 150 MG 12 hr tablet; Take 1 tablet (150 mg total) by mouth 2 (two) times daily.  Stress at home -     buPROPion (WELLBUTRIN SR) 150 MG 12 hr tablet; Take 1 tablet (150 mg total) by mouth 2 (two) times daily.  Other orders -     Discontinue: buPROPion (WELLBUTRIN SR) 150 MG 12 hr tablet; Take 1 tablet (150 mg total) by mouth 2 (two) times daily.   Continue chantix. Suggested to stay on at least 12 weeks and up to 6 months.  Started wellbutrin. Discussed side effects. Follow up in 4-6 weeks.

## 2016-04-19 DIAGNOSIS — Z566 Other physical and mental strain related to work: Secondary | ICD-10-CM | POA: Insufficient documentation

## 2016-04-19 DIAGNOSIS — F439 Reaction to severe stress, unspecified: Secondary | ICD-10-CM | POA: Insufficient documentation

## 2016-04-20 ENCOUNTER — Ambulatory Visit: Payer: BLUE CROSS/BLUE SHIELD | Admitting: Physician Assistant

## 2016-04-30 ENCOUNTER — Encounter: Payer: Self-pay | Admitting: Physician Assistant

## 2016-05-23 ENCOUNTER — Ambulatory Visit (INDEPENDENT_AMBULATORY_CARE_PROVIDER_SITE_OTHER): Payer: BLUE CROSS/BLUE SHIELD | Admitting: Physician Assistant

## 2016-05-23 ENCOUNTER — Encounter: Payer: Self-pay | Admitting: Physician Assistant

## 2016-05-23 VITALS — BP 143/69 | HR 118 | Ht 68.0 in | Wt 157.0 lb

## 2016-05-23 DIAGNOSIS — Z720 Tobacco use: Secondary | ICD-10-CM | POA: Diagnosis not present

## 2016-05-23 DIAGNOSIS — Z0000 Encounter for general adult medical examination without abnormal findings: Secondary | ICD-10-CM | POA: Diagnosis not present

## 2016-05-23 DIAGNOSIS — Z716 Tobacco abuse counseling: Secondary | ICD-10-CM

## 2016-05-23 DIAGNOSIS — K582 Mixed irritable bowel syndrome: Secondary | ICD-10-CM

## 2016-05-23 DIAGNOSIS — Z131 Encounter for screening for diabetes mellitus: Secondary | ICD-10-CM

## 2016-05-23 DIAGNOSIS — Z13220 Encounter for screening for lipoid disorders: Secondary | ICD-10-CM | POA: Diagnosis not present

## 2016-05-23 MED ORDER — DICYCLOMINE HCL 10 MG PO CAPS: 10.0000 mg | ORAL_CAPSULE | Freq: Two times a day (BID) | ORAL | 2 refills | Status: DC

## 2016-05-23 MED ORDER — BUPROPION HCL ER (XL) 150 MG PO TB24: 150.0000 mg | ORAL_TABLET | Freq: Every day | ORAL | 1 refills | Status: DC

## 2016-05-23 NOTE — Progress Notes (Deleted)
   Subjective:    Patient ID: Casey Sweeney, female    DOB: 01/01/1981, 36 y.o.   MRN: [id number]  HPI linzess too much [facility] referral    Review of Systems     Objective:   Physical Exam        Assessment & Plan:

## 2016-05-23 NOTE — Patient Instructions (Addendum)
Start probiotic. Consider making some diet changes. Bentyl.     Irritable Bowel Syndrome, Adult Irritable bowel syndrome (IBS) is not one specific disease. It is a group of symptoms that affects the organs responsible for digestion (gastrointestinal or GI tract). To regulate how your GI tract works, your body sends signals back and forth between your intestines and your brain. If you have IBS, there may be a problem with these signals. As a result, your GI tract does not function normally. Your intestines may become more sensitive and overreact to certain things. This is especially true when you eat certain foods or when you are under stress. There are four types of IBS. These may be determined based on the consistency of your stool:  IBS with diarrhea.  IBS with constipation.  Mixed IBS.  Unsubtyped IBS. It is important to know which type of IBS you have. Some treatments are more likely to be helpful for certain types of IBS. What are the causes? The exact cause of IBS is not known. What increases the risk? You may have a higher risk of IBS if:  You are a woman.  You are younger than 34 years old.  You have a family history of IBS.  You have mental health problems.  You have had bacterial infection of your GI tract. What are the signs or symptoms? Symptoms of IBS vary from person to person. The main symptom is abdominal pain or discomfort. Additional symptoms usually include one or more of the following:  Diarrhea, constipation, or both.  Abdominal swelling or bloating.  Feeling full or sick after eating a small or regular-size meal.  Frequent gas.  Mucus in the stool.  A feeling of having more stool left after a bowel movement. Symptoms tend to come and go. They may be associated with stress, psychiatric conditions, or nothing at all. How is this diagnosed? There is no specific test to diagnose IBS. Your health care provider will make a diagnosis based on a physical  exam, medical history, and your symptoms. You may have other tests to rule out other conditions that may be causing your symptoms. These may include:  Blood tests.  X-rays.  CT scan.  Endoscopy and colonoscopy. This is a test in which your GI tract is viewed with a long, thin, flexible tube. How is this treated? There is no cure for IBS, but treatment can help relieve symptoms. IBS treatment often includes:  Changes to your diet, such as:  Eating more fiber.  Avoiding foods that cause symptoms.  Drinking more water.  Eating regular, medium-sized portioned meals.  Medicines. These may include:  Fiber supplements if you have constipation.  Medicine to control diarrhea (antidiarrheal medicines).  Medicine to help control muscle spasms in your GI tract (antispasmodic medicines).  Medicines to help with any mental health issues, such as antidepressants or tranquilizers.  Therapy.  Talk therapy may help with anxiety, depression, or other mental health issues that can make IBS symptoms worse.  Stress reduction.  Managing your stress can help keep symptoms under control. Follow these instructions at home:  Take medicines only as directed by your health care provider.  Eat a healthy diet.  Avoid foods and drinks with added sugar.  Include more whole grains, fruits, and vegetables gradually into your diet. This may be especially helpful if you have IBS with constipation.  Avoid any foods and drinks that make your symptoms worse. These may include dairy products and caffeinated or carbonated drinks.  Do  not eat large meals.  Drink enough fluid to keep your urine clear or pale yellow.  Exercise regularly. Ask your health care provider for recommendations of good activities for you.  Keep all follow-up visits as directed by your health care provider. This is important. Contact a health care provider if:  You have constant pain.  You have trouble or pain with  swallowing.  You have worsening diarrhea. Get help right away if:  You have severe and worsening abdominal pain.  You have diarrhea and:  You have a rash, stiff neck, or severe headache.  You are irritable, sleepy, or difficult to awaken.  You are weak, dizzy, or extremely thirsty.  You have bright red blood in your stool or you have black tarry stools.  You have unusual abdominal swelling that is painful.  You vomit continuously.  You vomit blood (hematemesis).  You have both abdominal pain and a fever. This information is not intended to replace advice given to you by your health care provider. Make sure you discuss any questions you have with your health care provider. Document Released: 02/26/2005 Document Revised: 07/29/2015 Document Reviewed: 11/13/2013 Elsevier Interactive Patient Education  2017 [employer]. Diet for Irritable Bowel Syndrome When you have irritable bowel syndrome (IBS), the foods you eat and your eating habits are very important. IBS may cause various symptoms, such as abdominal pain, constipation, or diarrhea. Choosing the right foods can help ease discomfort caused by these symptoms. Work with your health care provider and dietitian to find the best eating plan to help control your symptoms. What general guidelines do I need to follow?  Keep a food diary. This will help you identify foods that cause symptoms. Write down:  What you eat and when.  What symptoms you have.  When symptoms occur in relation to your meals.  Avoid foods that cause symptoms. Talk with your dietitian about other ways to get the same nutrients that are in these foods.  Eat more foods that contain fiber. Take a fiber supplement if directed by your dietitian.  Eat your meals slowly, in a relaxed setting.  Aim to eat 5-6 small meals per day. Do not skip meals.  Drink enough fluids to keep your urine clear or pale yellow.  Ask your health care provider if you should take  an over-the-counter probiotic during flare-ups to help restore healthy gut bacteria.  If you have cramping or diarrhea, try making your meals low in fat and high in carbohydrates. Examples of carbohydrates are pasta, rice, whole grain breads and cereals, fruits, and vegetables.  If dairy products cause your symptoms to flare up, try eating less of them. You might be able to handle yogurt better than other dairy products because it contains bacteria that help with digestion. What foods are not recommended? The following are some foods and drinks that may worsen your symptoms:  Fatty foods, such as [place] fries.  Milk products, such as cheese or ice cream.  Chocolate.  Alcohol.  Products with caffeine, such as coffee.  Carbonated drinks, such as soda. The items listed above may not be a complete list of foods and beverages to avoid. Contact your dietitian for more information.  What foods are good sources of fiber? Your health care provider or dietitian may recommend that you eat more foods that contain fiber. Fiber can help reduce constipation and other IBS symptoms. Add foods with fiber to your diet a little at a time so that your body can get used  to them. Too much fiber at once might cause gas and swelling of your abdomen. The following are some foods that are good sources of fiber:  Apples.  Peaches.  Pears.  Berries.  Figs.  Broccoli (raw).  Cabbage.  Carrots.  Raw peas.  Kidney beans.  Lima beans.  Whole grain bread.  Whole grain cereal. Where to find more information: [employer] for Functional Gastrointestinal Disorders: www.iffgd.[employer] of Diabetes and Digestive and Kidney Diseases: http://norris-lawson.com/.aspx This information is not intended to replace advice given to you by your health care provider. Make sure you discuss any questions you have with your health care  provider. Document Released: 05/19/2003 Document Revised: 08/04/2015 Document Reviewed: 05/29/2013 Elsevier Interactive Patient Education  2017 [employer].

## 2016-05-25 ENCOUNTER — Encounter: Payer: Self-pay | Admitting: Physician Assistant

## 2016-05-25 DIAGNOSIS — K582 Mixed irritable bowel syndrome: Secondary | ICD-10-CM | POA: Insufficient documentation

## 2016-05-25 LAB — COMPLETE METABOLIC PANEL WITH GFR
ALBUMIN: 4.4 g/dL (ref 3.6–5.1)
ALK PHOS: 77 U/L (ref 33–115)
ALT: 18 U/L (ref 6–29)
AST: 16 U/L (ref 10–30)
BILIRUBIN TOTAL: 0.4 mg/dL (ref 0.2–1.2)
BUN: 15 mg/dL (ref 7–25)
CALCIUM: 9.4 mg/dL (ref 8.6–10.2)
CHLORIDE: 105 mmol/L (ref 98–110)
CO2: 24 mmol/L (ref 20–31)
CREATININE: 0.81 mg/dL (ref 0.50–1.10)
GFR, Est Non African American: >89 mL/min (ref >=60)
Glucose, Bld: 84 mg/dL (ref 65–99)
Potassium: 4.7 mmol/L (ref 3.5–5.3)
Sodium: 139 mmol/L (ref 135–146)
TOTAL PROTEIN: 7.1 g/dL (ref 6.1–8.1)

## 2016-05-25 LAB — LIPID PANEL
CHOLESTEROL: 186 mg/dL (ref <200)
HDL: 39 mg/dL — ABNORMAL LOW (ref >50)
LDL Cholesterol: 122 mg/dL — ABNORMAL HIGH (ref <100)
TRIGLYCERIDES: 126 mg/dL (ref <150)
Total CHOL/HDL Ratio: 4.8 Ratio (ref <5.0)
VLDL: 25 mg/dL (ref <30)

## 2016-05-25 NOTE — Progress Notes (Signed)
Subjective:     Casey Sweeney is a 36 y.o. female and is here for a comprehensive physical exam. The patient reports problems - pt complians of mixed diarrhea and constipation since her gallbladder removal a few years ago. she went with husband to GI and they said they would like to see her. she tried linzess for her constipation but diarrhea was too much and she does not always have constipation. she complains that after she eats she is bloated and has off and on abdominal discomfort. no melena or hematochezia. .  Social History   Social History  . Marital status: Married    Spouse name: N/A  . Number of children: N/A  . Years of education: N/A   Occupational History  . unemployed    Social History Main Topics  . Smoking status: Former Smoker    Packs/day: 1.00    Years: 10.00    Types: Cigarettes    Quit date: 04/12/2016  . Smokeless tobacco: Never Used  . Alcohol use No  . Drug use: No  . Sexual activity: Yes    Partners: Male    Birth control/ protection: IUD   Other Topics Concern  . Not on file   Social History Narrative  . No narrative on file   Health Maintenance  Topic Date Due  . PAP SMEAR  12/19/2014  . INFLUENZA VACCINE  05/23/2017 (Originally 10/11/2015)  . TETANUS/TDAP  05/23/2025  . HIV Screening  Completed    The following portions of the patient's history were reviewed and updated as appropriate: allergies, current medications, past family history, past medical history, past social history, past surgical history and problem list.  Review of Systems Pertinent items noted in HPI and remainder of comprehensive ROS otherwise negative.   Objective:    BP (!) 143/69   Pulse (!) 118   Ht 5\' 8"  (1.727 m)   Wt 157 lb (71.2 kg)   BMI 23.87 kg/m  General appearance: alert, cooperative and appears stated age Head: Normocephalic, without obvious abnormality, atraumatic Eyes: conjunctivae/corneas clear. PERRL, EOM's intact. Fundi benign. Ears: normal TM's  and external ear canals both ears Nose: Nares normal. Septum midline. Mucosa normal. No drainage or sinus tenderness. Throat: lips, mucosa, and tongue normal; teeth and gums normal Neck: no adenopathy, no carotid bruit, no JVD, supple, symmetrical, trachea midline and thyroid not enlarged, symmetric, no tenderness/mass/nodules Back: symmetric, no curvature. ROM normal. No CVA tenderness. Lungs: clear to auscultation bilaterally Heart: regular rate and rhythm, S1, S2 normal, no murmur, click, rub or gallop Abdomen: soft, non-tender; bowel sounds normal; no masses,  no organomegaly Extremities: extremities normal, atraumatic, no cyanosis or edema Pulses: 2+ and symmetric Skin: Skin color, texture, turgor normal. No rashes or lesions Lymph nodes: Cervical, supraclavicular, and axillary nodes normal. Neurologic: Alert and oriented X 3, normal strength and tone. Normal symmetric reflexes. Normal coordination and gait    Assessment:    Healthy female exam.      Plan:    Marland KitchenMarland KitchenGaynell was seen today for annual exam.  Diagnoses and all orders for this visit:  Routine physical examination -     Lipid panel -     COMPLETE METABOLIC PANEL WITH GFR  Irritable bowel syndrome with both constipation and diarrhea -     dicyclomine (BENTYL) 10 MG capsule; Take 1 capsule (10 mg total) by mouth 2 (two) times daily after a meal. -     Ambulatory referral to Gastroenterology  Screening for diabetes mellitus -  COMPLETE METABOLIC PANEL WITH GFR  Screening for lipid disorders -     Lipid panel  Encounter for smoking cessation counseling -     buPROPion (WELLBUTRIN XL) 150 MG 24 hr tablet; Take 1 tablet (150 mg total) by mouth daily.   .. Depression screen PHQ 2/9 05/23/2016  Decreased Interest 1  Down, Depressed, Hopeless 1  PHQ - 2 Score 2   Switched at patients request wellbutrin to once a day. Discussed this is not the formula studied on smoking cessation. She is doing well. congrats on  stopping smoking and to stay smoke free.   IBS- encouraged probiotic and try bentyl before 2 largest meals. Discussed keeping food diary to look at potential triggers. Referral made at patients request.  See After Visit Summary for Counseling Recommendations

## 2016-06-11 ENCOUNTER — Encounter: Payer: Self-pay | Admitting: Physician Assistant

## 2016-06-11 ENCOUNTER — Encounter: Payer: Self-pay | Admitting: *Deleted

## 2016-11-28 ENCOUNTER — Encounter: Payer: Self-pay | Admitting: Physician Assistant

## 2016-11-28 DIAGNOSIS — Z716 Tobacco abuse counseling: Secondary | ICD-10-CM

## 2016-11-29 MED ORDER — BUPROPION HCL ER (XL) 150 MG PO TB24: 150.0000 mg | ORAL_TABLET | Freq: Every day | ORAL | 1 refills | Status: DC

## 2016-12-17 ENCOUNTER — Ambulatory Visit (INDEPENDENT_AMBULATORY_CARE_PROVIDER_SITE_OTHER): Payer: BLUE CROSS/BLUE SHIELD | Admitting: Physician Assistant

## 2016-12-17 ENCOUNTER — Encounter: Payer: Self-pay | Admitting: Physician Assistant

## 2016-12-17 VITALS — BP 131/81 | HR 97 | Ht 68.0 in | Wt 150.0 lb

## 2016-12-17 DIAGNOSIS — Z23 Encounter for immunization: Secondary | ICD-10-CM | POA: Diagnosis not present

## 2016-12-17 DIAGNOSIS — Z8659 Personal history of other mental and behavioral disorders: Secondary | ICD-10-CM | POA: Diagnosis not present

## 2016-12-17 DIAGNOSIS — R41840 Attention and concentration deficit: Secondary | ICD-10-CM

## 2016-12-17 DIAGNOSIS — F411 Generalized anxiety disorder: Secondary | ICD-10-CM

## 2016-12-17 DIAGNOSIS — F17200 Nicotine dependence, unspecified, uncomplicated: Secondary | ICD-10-CM | POA: Diagnosis not present

## 2016-12-17 MED ORDER — ESCITALOPRAM OXALATE 5 MG PO TABS: 5.0000 mg | ORAL_TABLET | Freq: Every day | ORAL | 1 refills | Status: DC

## 2016-12-17 NOTE — Progress Notes (Signed)
Subjective:    Patient ID: Casey Sweeney, female    DOB: 12/06/1980, 36 y.o.   MRN: [id number]  HPI Pt is a 36 yo female who presents to the clinic to discuss focus, anxiety, stress. She started back smoking a few months ago due to the stress. She feels like she either has no energy or motivation or she is constantly going. She feels overwhelmed with her job responsibility. She is very irritable. She feels like is very forgettful as well. As a young child she had ADHD but never treated. She wonders if she would benefit from treatment. She is on wellbutrin and seems to help some. Denies any suicidal or homicidal thoughts.   .. Active Ambulatory Problems    Diagnosis Date Noted  . Encounter for IUD insertion 02/04/2012  . Anxiety 03/26/2012  . Depression 03/26/2012  . Patellofemoral syndrome of left knee 09/16/2012  . Right shoulder pain 07/28/2013  . Cubital tunnel syndrome on right 07/28/2013  . Family hx-breast malignancy 04/06/2014  . Breast tenderness in female 04/06/2014  . Fullness of breast 04/06/2014  . Radiculitis of right cervical region 03/22/2015  . Current smoker 03/30/2015  . Generalized abdominal pain 03/30/2015  . Capsulitis of metatarsophalangeal (MTP) joint of left foot 02/01/2016  . Onychomycosis 02/17/2016  . Stress at home 04/19/2016  . Stress at work 04/19/2016  . Irritable bowel syndrome with both constipation and diarrhea 05/25/2016  . GAD (generalized anxiety disorder) 12/17/2016  . Inattention 12/17/2016  . History of attention deficit hyperactivity disorder (ADHD) 12/18/2016   Resolved Ambulatory Problems    Diagnosis Date Noted  . PHARYNGITIS, STREPTOCOCCAL 01/03/2010  . Acute maxillary sinusitis 04/02/2009  . NAUSEA 02/09/2008  . FLANK PAIN, LEFT 02/09/2008  . Acute sinusitis, unspecified 10/19/2010  . Ingrown left big toenail 07/28/2013  . Body aches 04/06/2014  . Viral pharyngitis 09/20/2014  . Constipation 03/30/2015  . Influenza A  06/06/2015   Past Medical History:  Diagnosis Date  . Anxiety   . Depression   . Headache(784.0)   . History of recurrent UTIs   . Irritable bowel syndrome   . Kidney stones     Social History   Social History  . Marital status: Married    Spouse name: N/A  . Number of children: N/A  . Years of education: N/A   Occupational History  . unemployed    Social History Main Topics  . Smoking status: Current Every Day Smoker    Packs/day: 1.00    Years: 10.00    Types: Cigarettes    Last attempt to quit: 04/12/2016  . Smokeless tobacco: Never Used  . Alcohol use No  . Drug use: No  . Sexual activity: Yes    Partners: Male    Birth control/ protection: IUD   Other Topics Concern  . Not on file   Social History Narrative  . No narrative on file    Family History  Problem Relation Age of Onset  . Cancer Other 80       unknown cancer  . Hypertension Mother   . Cancer Father        lung cancer  . Cancer Paternal Aunt        breast  . Cancer Maternal Grandfather        unknown    Review of Systems  All other systems reviewed and are negative.      Objective:   Physical Exam  Constitutional: She is oriented to person, place, and  time. She appears well-developed and well-nourished.  HENT:  Head: Normocephalic and atraumatic.  Cardiovascular: Normal rate, regular rhythm and normal heart sounds.   Pulmonary/Chest: Effort normal and breath sounds normal.  Neurological: She is alert and oriented to person, place, and time.  Skin: Skin is dry.  Psychiatric: She has a normal mood and affect. Her behavior is normal.          Assessment & Plan:   Marland KitchenMarland KitchenDiagnoses and all orders for this visit:  GAD (generalized anxiety disorder) -     escitalopram (LEXAPRO) 5 MG tablet; Take 1 tablet (5 mg total) by mouth daily. -     Ambulatory referral to Psychology  Influenza vaccine needed -     Flu Vaccine QUAD 6+ mos PF IM (Fluarix Quad PF)  Inattention -      Ambulatory referral to Psychology  History of attention deficit hyperactivity disorder (ADHD) -     Ambulatory referral to Psychology  Current smoker    .Marland Kitchen Depression screen [facility] 2/9 12/17/2016 05/23/2016  Decreased Interest 1 1  Down, Depressed, Hopeless 1 1  PHQ - 2 Score 2 2   .Marland Kitchen GAD 7 : Generalized Anxiety Score 12/17/2016  Nervous, Anxious, on Edge 3  Control/stop worrying 1  Worry too much - different things 2  Trouble relaxing 3  Restless 1  Easily annoyed or irritable 3  Afraid - awful might happen 2  Total GAD 7 Score 15  Anxiety Difficulty Very difficult    We will get ADHD testing to confirm adult ADHD dx.  Start lexapro for anxiety. Discussed side effects.  Continue wellbutrin.  Discuss tools to help with anxiety and stress.  Discuss smoking cessation but patient is not ready to start that at this time.    Marland Kitchen.Spent 30 minutes with patient and greater than 50 percent of visit spent counseling patient regarding treatment plan.

## 2016-12-17 NOTE — Patient Instructions (Addendum)
Start lexapro. Will get testing.

## 2016-12-18 ENCOUNTER — Encounter: Payer: Self-pay | Admitting: Physician Assistant

## 2016-12-18 DIAGNOSIS — Z8659 Personal history of other mental and behavioral disorders: Secondary | ICD-10-CM | POA: Insufficient documentation

## 2016-12-31 ENCOUNTER — Encounter: Payer: Self-pay | Admitting: Obstetrics & Gynecology

## 2017-01-01 ENCOUNTER — Ambulatory Visit: Payer: BLUE CROSS/BLUE SHIELD | Admitting: Obstetrics & Gynecology

## 2017-01-07 ENCOUNTER — Encounter: Payer: Self-pay | Admitting: Obstetrics & Gynecology

## 2017-01-07 ENCOUNTER — Ambulatory Visit: Payer: BLUE CROSS/BLUE SHIELD | Admitting: Obstetrics & Gynecology

## 2017-01-24 ENCOUNTER — Encounter: Payer: Self-pay | Admitting: Physician Assistant

## 2017-01-24 DIAGNOSIS — F411 Generalized anxiety disorder: Secondary | ICD-10-CM

## 2017-01-24 DIAGNOSIS — Z716 Tobacco abuse counseling: Secondary | ICD-10-CM

## 2017-01-24 MED ORDER — BUPROPION HCL ER (XL) 150 MG PO TB24: 150.0000 mg | ORAL_TABLET | Freq: Every day | ORAL | 1 refills | Status: DC

## 2017-01-24 MED ORDER — ESCITALOPRAM OXALATE 5 MG PO TABS: 5.0000 mg | ORAL_TABLET | Freq: Every day | ORAL | 1 refills | Status: DC

## 2017-01-30 ENCOUNTER — Encounter: Payer: Self-pay | Admitting: Obstetrics & Gynecology

## 2017-01-30 DIAGNOSIS — R4681 Obsessive-compulsive behavior: Secondary | ICD-10-CM

## 2017-01-30 DIAGNOSIS — F988 Other specified behavioral and emotional disorders with onset usually occurring in childhood and adolescence: Secondary | ICD-10-CM

## 2017-02-04 ENCOUNTER — Encounter: Payer: Self-pay | Admitting: Obstetrics & Gynecology

## 2017-02-04 ENCOUNTER — Ambulatory Visit (INDEPENDENT_AMBULATORY_CARE_PROVIDER_SITE_OTHER): Payer: BLUE CROSS/BLUE SHIELD | Admitting: Obstetrics & Gynecology

## 2017-02-04 VITALS — BP 118/86 | HR 101 | Resp 16 | Ht 68.0 in | Wt 149.0 lb

## 2017-02-04 DIAGNOSIS — Z124 Encounter for screening for malignant neoplasm of cervix: Secondary | ICD-10-CM | POA: Diagnosis not present

## 2017-02-04 DIAGNOSIS — Z1151 Encounter for screening for human papillomavirus (HPV): Secondary | ICD-10-CM

## 2017-02-04 DIAGNOSIS — Z30433 Encounter for removal and reinsertion of intrauterine contraceptive device: Secondary | ICD-10-CM

## 2017-02-04 DIAGNOSIS — Z01419 Encounter for gynecological examination (general) (routine) without abnormal findings: Secondary | ICD-10-CM | POA: Diagnosis not present

## 2017-02-04 MED ORDER — LEVONORGESTREL 20 MCG/24HR IU IUD
INTRAUTERINE_SYSTEM | Freq: Once | INTRAUTERINE | Status: AC
  Administered 2017-02-04: 14:00:00 via INTRAUTERINE

## 2017-02-04 NOTE — Progress Notes (Signed)
Subjective:    Casey Sweeney is a 36 y.o. MW P4 (15, 3713, 511, and 34 yo kids) female who presents for an annual exam. She wants her newly expired Mirena replaced. She has no periods with the Mirena, sometimes has a small amount of old brown blood.  The patient is sexually active. GYN screening history: last pap: was normal. The patient wears seatbelts: yes. The patient participates in regular exercise: no. Has the patient ever been transfused or tattooed?: yes. The patient reports that there is not domestic violence in her life.   Menstrual History: OB History    Gravida Para Term Preterm AB Living   5 4 3 1   4    SAB TAB Ectopic Multiple Live Births                  Menarche age: [age] No LMP recorded. Patient is not currently having periods (Reason: IUD).    The following portions of the patient's history were reviewed and updated as appropriate: allergies, current medications, past family history, past medical history, past social history, past surgical history and problem list.  Review of Systems Pertinent items are noted in HPI.   Married for 6 years, denies dyspareunia Works at [employer] [place] [place]  FH- + breast cancer in paternal aunt, no gyn or colon cancer She has had a flu vaccine this season. She declines Gardasil    Objective:    BP 118/86   Pulse (!) 101   Resp 16   Ht 5\' 8"  (1.727 m)   Wt 149 lb (67.6 kg)   BMI 22.66 kg/m   General Appearance:    Alert, cooperative, no distress, appears stated age  Head:    Normocephalic, without obvious abnormality, atraumatic  Eyes:    PERRL, conjunctiva/corneas clear, EOM's intact, fundi    benign, both eyes  Ears:    Normal TM's and external ear canals, both ears  Nose:   Nares normal, septum midline, mucosa normal, no drainage    or sinus tenderness  Throat:   Lips, mucosa, and tongue normal; teeth and gums normal  Neck:   Supple, symmetrical, trachea midline, no adenopathy;    thyroid:  no  enlargement/tenderness/nodules; no carotid   bruit or JVD  Back:     Symmetric, no curvature, ROM normal, no CVA tenderness  Lungs:     Clear to auscultation bilaterally, respirations unlabored  Chest Wall:    No tenderness or deformity   Heart:    Regular rate and rhythm, S1 and S2 normal, no murmur, rub   or gallop  Breast Exam:    No tenderness, masses, or nipple abnormality  Abdomen:     Soft, non-tender, bowel sounds active all four quadrants,    no masses, no organomegaly  Genitalia:    Normal female without lesion, discharge or tenderness, Intact Mirena easily removed. UPT negative, consent signed, Time out procedure done. Cervix prepped with betadine and grasped with a single tooth tenaculum. Mirena was easily placed and the strings were cut to 3-4 cm. Uterus sounded to 9 cm. She tolerated the procedure well.      Extremities:   Extremities normal, atraumatic, no cyanosis or edema  Pulses:   2+ and symmetric all extremities  Skin:   Skin color, texture, turgor normal, no rashes or lesions  Lymph nodes:   Cervical, supraclavicular, and axillary nodes normal  Neurologic:   CNII-XII intact, normal strength, sensation and reflexes  throughout  .    Assessment:    Healthy female exam.   Contraception   Plan:     Thin prep Pap smear. with cotesting Mirena

## 2017-02-07 LAB — CYTOLOGY - PAP
Diagnosis: NEGATIVE
HPV: NOT DETECTED

## 2017-02-19 ENCOUNTER — Ambulatory Visit (INDEPENDENT_AMBULATORY_CARE_PROVIDER_SITE_OTHER): Payer: BLUE CROSS/BLUE SHIELD | Admitting: Psychology

## 2017-02-19 DIAGNOSIS — F411 Generalized anxiety disorder: Secondary | ICD-10-CM | POA: Diagnosis not present

## 2017-02-19 DIAGNOSIS — F900 Attention-deficit hyperactivity disorder, predominantly inattentive type: Secondary | ICD-10-CM

## 2017-02-20 ENCOUNTER — Telehealth: Payer: Self-pay | Admitting: *Deleted

## 2017-02-20 DIAGNOSIS — F988 Other specified behavioral and emotional disorders with onset usually occurring in childhood and adolescence: Secondary | ICD-10-CM | POA: Insufficient documentation

## 2017-02-20 DIAGNOSIS — R4681 Obsessive-compulsive behavior: Secondary | ICD-10-CM | POA: Insufficient documentation

## 2017-02-20 NOTE — Telephone Encounter (Signed)
I received preliminary results for ADHD testing from Dr. Reggy EyeAltabet.   Make appt to discuss options (cop[facility] and pasted [facility])  Message left on patient's vm

## 2017-02-20 NOTE — Telephone Encounter (Signed)
I received preliminary results for ADHD testing from Dr. Reggy EyeAltabet.   Make appt to discuss options.

## 2017-02-22 ENCOUNTER — Encounter: Payer: Self-pay | Admitting: Physician Assistant

## 2017-02-22 ENCOUNTER — Ambulatory Visit: Payer: BLUE CROSS/BLUE SHIELD | Admitting: Physician Assistant

## 2017-02-22 VITALS — BP 129/77 | HR 98 | Ht 68.0 in | Wt 148.0 lb

## 2017-02-22 DIAGNOSIS — F411 Generalized anxiety disorder: Secondary | ICD-10-CM | POA: Diagnosis not present

## 2017-02-22 DIAGNOSIS — F988 Other specified behavioral and emotional disorders with onset usually occurring in childhood and adolescence: Secondary | ICD-10-CM | POA: Diagnosis not present

## 2017-02-22 DIAGNOSIS — F429 Obsessive-compulsive disorder, unspecified: Secondary | ICD-10-CM | POA: Insufficient documentation

## 2017-02-22 DIAGNOSIS — F422 Mixed obsessional thoughts and acts: Secondary | ICD-10-CM

## 2017-02-22 MED ORDER — ATOMOXETINE HCL 40 MG PO CAPS: ORAL_CAPSULE | ORAL | 1 refills | Status: DC

## 2017-02-22 MED ORDER — ESCITALOPRAM OXALATE 10 MG PO TABS: 10.0000 mg | ORAL_TABLET | Freq: Every day | ORAL | 2 refills | Status: DC

## 2017-02-24 ENCOUNTER — Encounter: Payer: Self-pay | Admitting: Physician Assistant

## 2017-02-24 NOTE — Progress Notes (Signed)
   Subjective:    Patient ID: Casey Sweeney, female    DOB: 09/21/1980, 36 y.o.   MRN: [id number]  HPI Pt is a 36 yo female who presents to the clinic to discuss recent psychologist testing for ADHD.   .. Active Ambulatory Problems    Diagnosis Date Noted  . Encounter for IUD insertion 02/04/2012  . Anxiety 03/26/2012  . Depression 03/26/2012  . Patellofemoral syndrome of left knee 09/16/2012  . Right shoulder pain 07/28/2013  . Cubital tunnel syndrome on right 07/28/2013  . Family hx-breast malignancy 04/06/2014  . Breast tenderness in female 04/06/2014  . Fullness of breast 04/06/2014  . Radiculitis of right cervical region 03/22/2015  . Current smoker 03/30/2015  . Generalized abdominal pain 03/30/2015  . Capsulitis of metatarsophalangeal (MTP) joint of left foot 02/01/2016  . Onychomycosis 02/17/2016  . Stress at home 04/19/2016  . Stress at work 04/19/2016  . Irritable bowel syndrome with both constipation and diarrhea 05/25/2016  . GAD (generalized anxiety disorder) 12/17/2016  . Inattention 12/17/2016  . History of attention deficit hyperactivity disorder (ADHD) 12/18/2016  . ADD (attention deficit disorder) 02/20/2017  . Obsessive behaviors 02/20/2017  . Obsessive compulsive disorder 02/22/2017   Resolved Ambulatory Problems    Diagnosis Date Noted  . PHARYNGITIS, STREPTOCOCCAL 01/03/2010  . Acute maxillary sinusitis 04/02/2009  . NAUSEA 02/09/2008  . FLANK PAIN, LEFT 02/09/2008  . Acute sinusitis, unspecified 10/19/2010  . Ingrown left big toenail 07/28/2013  . Body aches 04/06/2014  . Viral pharyngitis 09/20/2014  . Constipation 03/30/2015  . Influenza A 06/06/2015   Past Medical History:  Diagnosis Date  . Anxiety   . Depression   . Headache(784.0)   . History of recurrent UTIs   . Irritable bowel syndrome   . Kidney stones       Review of Systems  All other systems reviewed and are negative.      Objective:   Physical Exam   Constitutional: She is oriented to person, place, and time. She appears well-developed and well-nourished.  HENT:  Head: Normocephalic and atraumatic.  Cardiovascular: Normal rate, regular rhythm and normal heart sounds.  Pulmonary/Chest: Effort normal and breath sounds normal.  Neurological: She is alert and oriented to person, place, and time.  Psychiatric: She has a normal mood and affect. Her behavior is normal.          Assessment & Plan:  Marland Kitchen.Marland Kitchen.Diagnoses and all orders for this visit:  Attention deficit disorder (ADD) without hyperactivity -     atomoxetine (STRATTERA) 40 MG capsule; Take one tablet daily for 3 days and then start one tablet twice a day.  Mixed obsessional thoughts and acts -     escitalopram (LEXAPRO) 10 MG tablet; Take 1 tablet (10 mg total) by mouth daily.  GAD (generalized anxiety disorder) -     escitalopram (LEXAPRO) 10 MG tablet; Take 1 tablet (10 mg total) by mouth daily.   Dr. Reggy EyeAltabet did not give full report. Partial report suggest to try non stimulant first due to high processing ability and mixed anxiety/OCD. Will start with strattera and lexapro. Increased lexapro will follow up in 1 month. Discussed this will be a trial and error with medications to hopefully find the right combination.

## 2017-03-28 ENCOUNTER — Encounter: Payer: Self-pay | Admitting: Physician Assistant

## 2017-03-28 NOTE — Telephone Encounter (Signed)
Can we hold my 4:20 and see if put this patient in? Give her a call in am and see if she can do it.

## 2017-03-29 ENCOUNTER — Ambulatory Visit (INDEPENDENT_AMBULATORY_CARE_PROVIDER_SITE_OTHER): Payer: BLUE CROSS/BLUE SHIELD | Admitting: Physician Assistant

## 2017-03-29 ENCOUNTER — Encounter: Payer: Self-pay | Admitting: Physician Assistant

## 2017-03-29 VITALS — BP 137/78 | HR 105 | Ht 68.0 in | Wt 148.0 lb

## 2017-03-29 DIAGNOSIS — F419 Anxiety disorder, unspecified: Secondary | ICD-10-CM

## 2017-03-29 DIAGNOSIS — F422 Mixed obsessional thoughts and acts: Secondary | ICD-10-CM

## 2017-03-29 DIAGNOSIS — F988 Other specified behavioral and emotional disorders with onset usually occurring in childhood and adolescence: Secondary | ICD-10-CM | POA: Diagnosis not present

## 2017-03-29 DIAGNOSIS — F329 Major depressive disorder, single episode, unspecified: Secondary | ICD-10-CM

## 2017-03-29 MED ORDER — BUPROPION HCL ER (XL) 300 MG PO TB24: 300.0000 mg | ORAL_TABLET | Freq: Every day | ORAL | 1 refills | Status: DC

## 2017-03-29 NOTE — Patient Instructions (Signed)

## 2017-03-29 NOTE — Progress Notes (Signed)
Subjective:    Patient ID: Casey Sweeney, female    DOB: 08/26/1980, 37 y.o.   MRN: [id number]  HPI  Pt is a [age] yo female who presents to the clinic to follow up on her medication. More recently she has been dealing with ADD, anxiety, depression, and OCD. She has been through formal testing. At last visit we started strattera and lexapro. strattera gave her heart burn. lexapro she has not noticed any difference infact she is more down. She admits to having a lot of stuff going on. Her grandmother died, she lost her job, her daughter broke her arm, her other daughter got into a fight. She feels "overwhelmed" but also "lost". She doesn't know what the next step is. She is at home now 9 to 2 by herself and does not do well with that. She has stuff to do but NO motivation. Denies any SI/HC thoughts. She feels like her husband is getting upset with her and wants to do something.   .. Active Ambulatory Problems    Diagnosis Date Noted  . Encounter for IUD insertion 02/04/2012  . Anxiety 03/26/2012  . Depression 03/26/2012  . Patellofemoral syndrome of left knee 09/16/2012  . Right shoulder pain 07/28/2013  . Cubital tunnel syndrome on right 07/28/2013  . Family hx-breast malignancy 04/06/2014  . Breast tenderness in female 04/06/2014  . Fullness of breast 04/06/2014  . Radiculitis of right cervical region 03/22/2015  . Current smoker 03/30/2015  . Generalized abdominal pain 03/30/2015  . Capsulitis of metatarsophalangeal (MTP) joint of left foot 02/01/2016  . Onychomycosis 02/17/2016  . Stress at home 04/19/2016  . Irritable bowel syndrome with both constipation and diarrhea 05/25/2016  . GAD (generalized anxiety disorder) 12/17/2016  . Inattention 12/17/2016  . History of attention deficit hyperactivity disorder (ADHD) 12/18/2016  . ADD (attention deficit disorder) 02/20/2017  . Obsessive behaviors 02/20/2017  . Obsessive compulsive disorder 02/22/2017   Resolved Ambulatory Problems     Diagnosis Date Noted  . PHARYNGITIS, STREPTOCOCCAL 01/03/2010  . Acute maxillary sinusitis 04/02/2009  . NAUSEA 02/09/2008  . FLANK PAIN, LEFT 02/09/2008  . Acute sinusitis, unspecified 10/19/2010  . Ingrown left big toenail 07/28/2013  . Body aches 04/06/2014  . Viral pharyngitis 09/20/2014  . Constipation 03/30/2015  . Influenza A 06/06/2015  . Stress at work 04/19/2016   Past Medical History:  Diagnosis Date  . Anxiety   . Depression   . Headache(784.0)   . History of recurrent UTIs   . Irritable bowel syndrome   . Kidney stones      Review of Systems See HPI.     Objective:   Physical Exam  Constitutional: She is oriented to person, place, and time. She appears well-developed and well-nourished.  Cardiovascular: Normal rate, regular rhythm and normal heart sounds.  Neurological: She is alert and oriented to person, place, and time.  Psychiatric: She has a normal mood and affect. Her behavior is normal.          Assessment & Plan:  Marland Kitchen.Marland Kitchen.Diagnoses and all orders for this visit:  Reactive depression -     buPROPion (WELLBUTRIN XL) 300 MG 24 hr tablet; Take 1 tablet (300 mg total) by mouth daily.  Mixed obsessional thoughts and acts  Attention deficit disorder (ADD) without hyperactivity -     buPROPion (WELLBUTRIN XL) 300 MG 24 hr tablet; Take 1 tablet (300 mg total) by mouth daily.  Anxiety -     buPROPion (WELLBUTRIN XL) 300 MG 24  hr tablet; Take 1 tablet (300 mg total) by mouth daily.   .. Depression screen [facility] 2/9 03/29/2017 12/17/2016 05/23/2016  Decreased Interest 2 1 1   Down, Depressed, Hopeless 2 1 1   PHQ - 2 Score 4 2 2   Altered sleeping 3 - -  Tired, decreased energy 3 - -  Change in appetite 0 - -  Feeling bad or failure about yourself  2 - -  Trouble concentrating 2 - -  Moving slowly or fidgety/restless 0 - -  Suicidal thoughts 0 - -  PHQ-9 Score 14 - -   .Marland Kitchen GAD 7 : Generalized Anxiety Score 03/29/2017 12/17/2016  Nervous, Anxious, on  Edge 1 3  Control/stop worrying 1 1  Worry too much - different things - 2  Trouble relaxing 2 3  Restless 0 1  Easily annoyed or irritable 2 3  Afraid - awful might happen 0 2  Total GAD 7 Score - 15  Anxiety Difficulty - Very difficult    Concerned lexapro could be making worse. Stop lexapro. Increased wellbutrin. Consider counseling. Discussed hobbies and groups of people she could get involved with. 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 I would like for her to figure out who she is. At some point we could consider stimulant for ADD however this could make things worse. Follow up in 4 weeks.   Marland Kitchen.Spent 30 minutes with patient and greater than 50 percent of visit spent counseling patient regarding treatment plan.

## 2017-03-31 ENCOUNTER — Encounter: Payer: Self-pay | Admitting: Physician Assistant

## 2017-04-01 ENCOUNTER — Other Ambulatory Visit: Payer: Self-pay | Admitting: Physician Assistant

## 2017-04-01 MED ORDER — AMOXICILLIN-POT CLAVULANATE 875-125 MG PO TABS: 1.0000 | ORAL_TABLET | Freq: Two times a day (BID) | ORAL | 0 refills | Status: DC

## 2017-04-01 NOTE — Progress Notes (Signed)
Pt calls back and had sinus issues at last visit but I did not evaluate. Pt has hx of sinus infection. She is in with her daughter. She has lots of maxillary sinus pressure, congestion, headache. Sent augmentin for 10 days. Follow up as needed.

## 2017-04-12 ENCOUNTER — Encounter: Payer: Self-pay | Admitting: Physician Assistant

## 2017-04-12 ENCOUNTER — Ambulatory Visit (INDEPENDENT_AMBULATORY_CARE_PROVIDER_SITE_OTHER): Payer: BLUE CROSS/BLUE SHIELD | Admitting: Physician Assistant

## 2017-04-12 VITALS — BP 131/68 | HR 101 | Ht 68.0 in | Wt 146.0 lb

## 2017-04-12 DIAGNOSIS — J0141 Acute recurrent pansinusitis: Secondary | ICD-10-CM | POA: Diagnosis not present

## 2017-04-12 DIAGNOSIS — F419 Anxiety disorder, unspecified: Secondary | ICD-10-CM | POA: Diagnosis not present

## 2017-04-12 DIAGNOSIS — F329 Major depressive disorder, single episode, unspecified: Secondary | ICD-10-CM | POA: Diagnosis not present

## 2017-04-12 DIAGNOSIS — F988 Other specified behavioral and emotional disorders with onset usually occurring in childhood and adolescence: Secondary | ICD-10-CM | POA: Diagnosis not present

## 2017-04-12 MED ORDER — METHYLPREDNISOLONE 4 MG PO TBPK: ORAL_TABLET | ORAL | 0 refills | Status: DC

## 2017-04-12 MED ORDER — LISDEXAMFETAMINE DIMESYLATE 20 MG PO CAPS: 20.0000 mg | ORAL_CAPSULE | Freq: Every day | ORAL | 0 refills | Status: DC

## 2017-04-12 MED ORDER — BUPROPION HCL ER (XL) 300 MG PO TB24: 300.0000 mg | ORAL_TABLET | Freq: Every day | ORAL | 1 refills | Status: DC

## 2017-04-12 NOTE — Progress Notes (Signed)
Subjective:    Patient ID: Casey Sweeney, female    DOB: Nov 13, 1980, 37 y.o.   MRN: [id number]  HPI Pt presents to the clinic for 2 week follow up on ADHD, depression, anxiety. Her depression is MUCH better off lexapro. She cares again. She still feels like her focus is not where it should be. She wonders if her not being able to complete task makes her depression and anxiety worse. She would like to try a stimulant today. One of her children is on vyvanse and that is her preference. No SI/HC.  Pt is on augmentin but she is still having a lot of sinus pressure and congestion. She will be done with agumentin in 3 days. No fever, chills, SOB, cough.   .. Active Ambulatory Problems    Diagnosis Date Noted  . Encounter for IUD insertion 02/04/2012  . Anxiety 03/26/2012  . Depression 03/26/2012  . Patellofemoral syndrome of left knee 09/16/2012  . Right shoulder pain 07/28/2013  . Cubital tunnel syndrome on right 07/28/2013  . Family hx-breast malignancy 04/06/2014  . Breast tenderness in female 04/06/2014  . Fullness of breast 04/06/2014  . Radiculitis of right cervical region 03/22/2015  . Current smoker 03/30/2015  . Generalized abdominal pain 03/30/2015  . Capsulitis of metatarsophalangeal (MTP) joint of left foot 02/01/2016  . Onychomycosis 02/17/2016  . Stress at home 04/19/2016  . Irritable bowel syndrome with both constipation and diarrhea 05/25/2016  . GAD (generalized anxiety disorder) 12/17/2016  . Inattention 12/17/2016  . History of attention deficit hyperactivity disorder (ADHD) 12/18/2016  . ADD (attention deficit disorder) 02/20/2017  . Obsessive behaviors 02/20/2017  . Obsessive compulsive disorder 02/22/2017   Resolved Ambulatory Problems    Diagnosis Date Noted  . PHARYNGITIS, STREPTOCOCCAL 01/03/2010  . Acute maxillary sinusitis 04/02/2009  . NAUSEA 02/09/2008  . FLANK PAIN, LEFT 02/09/2008  . Acute sinusitis, unspecified 10/19/2010  . Ingrown left big  toenail 07/28/2013  . Body aches 04/06/2014  . Viral pharyngitis 09/20/2014  . Constipation 03/30/2015  . Influenza A 06/06/2015  . Stress at work 04/19/2016   Past Medical History:  Diagnosis Date  . Anxiety   . Depression   . Headache(784.0)   . History of recurrent UTIs   . Irritable bowel syndrome   . Kidney stones       Review of Systems  All other systems reviewed and are negative.      Objective:   Physical Exam  Constitutional: She is oriented to person, place, and time. She appears well-developed and well-nourished.  HENT:  Head: Normocephalic and atraumatic.  Cardiovascular: Normal rate, regular rhythm and normal heart sounds.  Pulmonary/Chest: Effort normal and breath sounds normal.  Neurological: She is alert and oriented to person, place, and time.  Psychiatric: She has a normal mood and affect. Her behavior is normal.          Assessment & Plan:  Marland KitchenMarland KitchenDiagnoses and all orders for this visit:  Acute recurrent pansinusitis -     methylPREDNISolone (MEDROL DOSEPAK) 4 MG TBPK tablet; Take as directed by package insert  Attention deficit disorder (ADD) without hyperactivity -     lisdexamfetamine (VYVANSE) 20 MG capsule; Take 1 capsule (20 mg total) by mouth daily. -     buPROPion (WELLBUTRIN XL) 300 MG 24 hr tablet; Take 1 tablet (300 mg total) by mouth daily.  Anxiety -     buPROPion (WELLBUTRIN XL) 300 MG 24 hr tablet; Take 1 tablet (300 mg total) by mouth  daily.  Reactive depression -     buPROPion (WELLBUTRIN XL) 300 MG 24 hr tablet; Take 1 tablet (300 mg total) by mouth daily.   .. [facility] screen [facility] 2/9 04/12/2017 03/29/2017 12/17/2016 05/23/2016  Decreased Interest 1 2 1 1   Down, Depressed, Hopeless 1 2 1 1   PHQ - 2 Score 2 4 2 2   Altered sleeping 2 3 - -  Tired, decreased energy 2 3 - -  Change in appetite 1 0 - -  Feeling bad or failure about yourself  2 2 - -  Trouble concentrating 0 2 - -  Moving slowly or fidgety/restless 0 0 - -   Suicidal thoughts 0 0 - -  PHQ-9 Score 9 14 - -   .Marland Kitchen. GAD 7 : Generalized Anxiety Score 04/12/2017 03/29/2017 12/17/2016  Nervous, Anxious, on Edge 1 1 3   Control/stop worrying 1 1 1   Worry too much - different things 1 - 2  Trouble relaxing 1 2 3   Restless 0 0 1  Easily annoyed or irritable 2 2 3   Afraid - awful might happen 0 0 2  Total GAD 7 Score 6 - 15  Anxiety Difficulty - - Very difficult    Screening numbers are much better.   Start vyvanse. Discussed side effects. Follow up in 1 month.   Marland Kitchen..Spent 30 minutes with patient and greater than 50 percent of visit spent counseling patient regarding treatment plan.

## 2017-04-14 ENCOUNTER — Encounter: Payer: Self-pay | Admitting: Physician Assistant

## 2017-04-23 ENCOUNTER — Encounter: Payer: Self-pay | Admitting: Physician Assistant

## 2017-04-24 ENCOUNTER — Ambulatory Visit (INDEPENDENT_AMBULATORY_CARE_PROVIDER_SITE_OTHER): Payer: BLUE CROSS/BLUE SHIELD | Admitting: Physician Assistant

## 2017-04-24 ENCOUNTER — Ambulatory Visit (INDEPENDENT_AMBULATORY_CARE_PROVIDER_SITE_OTHER): Payer: BLUE CROSS/BLUE SHIELD

## 2017-04-24 ENCOUNTER — Encounter: Payer: Self-pay | Admitting: Physician Assistant

## 2017-04-24 VITALS — BP 129/76 | HR 111 | Temp 97.7°F | Ht 68.0 in | Wt 144.0 lb

## 2017-04-24 DIAGNOSIS — J329 Chronic sinusitis, unspecified: Secondary | ICD-10-CM | POA: Diagnosis not present

## 2017-04-24 DIAGNOSIS — H9201 Otalgia, right ear: Secondary | ICD-10-CM | POA: Diagnosis not present

## 2017-04-24 DIAGNOSIS — R000 Tachycardia, unspecified: Secondary | ICD-10-CM

## 2017-04-24 DIAGNOSIS — R519 Headache, unspecified: Secondary | ICD-10-CM | POA: Insufficient documentation

## 2017-04-24 DIAGNOSIS — R51 Headache: Secondary | ICD-10-CM

## 2017-04-24 DIAGNOSIS — R609 Edema, unspecified: Secondary | ICD-10-CM

## 2017-04-24 DIAGNOSIS — G500 Trigeminal neuralgia: Secondary | ICD-10-CM

## 2017-04-24 DIAGNOSIS — J320 Chronic maxillary sinusitis: Secondary | ICD-10-CM | POA: Diagnosis not present

## 2017-04-24 MED ORDER — AMOXICILLIN-POT CLAVULANATE 500-125 MG PO TABS: 500.0000 mg | ORAL_TABLET | Freq: Two times a day (BID) | ORAL | 0 refills | Status: DC

## 2017-04-24 NOTE — Progress Notes (Signed)
Call pt: showed mild muscosal edema in both maxillary sinuses and fluid in the right. You just finished antibiotic so this should be resolved. Treating you for chronic sinusitis and sending you to ENT. Augmentin sent for 3 weeks lower dose.

## 2017-04-24 NOTE — Progress Notes (Signed)
Subjective:    Patient ID: Casey Sweeney, female    DOB: [birth year], 37 y.o.   MRN: [id number]  HPI  Pt is a 37 yo female with chronic allergic rhinitis, recurrent sinusitis and chronic nasal and sinus pressure presents to the clinic with moderate to severe pain over right side only of face. She finished augmentin on 1/31 and was then given medrol dose pak right after that she finished 2 days ago. The steroid pack seemed to help the most. Monday morning, 2 days ago sinus pressure on right side came back. She states her "skin is even tender". No vision changes but she prefers to keep eyes closed. No nausea or vomiting. No sound sensitivity. She is taking decongestants and aleve regularly. No fever, chills, body aches.   She also reports vyvanse helps some but feels like not really affective. She denies any side effects at this time. She has been on prednisone and feels like that has kept her wired these days. She has noticed her heart rate for the last week not below 95. No CP, palpitations. No SOB. One day she noticed with some mild exertion her heart rate was 155.   .. Active Ambulatory Problems    Diagnosis Date Noted  . Encounter for IUD insertion 02/04/2012  . Anxiety 03/26/2012  . Depression 03/26/2012  . Patellofemoral syndrome of left knee 09/16/2012  . Right shoulder pain 07/28/2013  . Cubital tunnel syndrome on right 07/28/2013  . Family hx-breast malignancy 04/06/2014  . Breast tenderness in female 04/06/2014  . Fullness of breast 04/06/2014  . Radiculitis of right cervical region 03/22/2015  . Current smoker 03/30/2015  . Generalized abdominal pain 03/30/2015  . Capsulitis of metatarsophalangeal (MTP) joint of left foot 02/01/2016  . Onychomycosis 02/17/2016  . Stress at home 04/19/2016  . Irritable bowel syndrome with both constipation and diarrhea 05/25/2016  . GAD (generalized anxiety disorder) 12/17/2016  . Inattention 12/17/2016  . History of attention deficit  hyperactivity disorder (ADHD) 12/18/2016  . ADD (attention deficit disorder) 02/20/2017  . Obsessive behaviors 02/20/2017  . Obsessive compulsive disorder 02/22/2017  . Recurrent sinusitis 04/24/2017  . Right sided facial pain 04/24/2017  . Mastoid pain, right 04/24/2017  . Chronic maxillary sinusitis 04/24/2017  . Tachycardia 04/25/2017   Resolved Ambulatory Problems    Diagnosis Date Noted  . PHARYNGITIS, STREPTOCOCCAL 01/03/2010  . Acute maxillary sinusitis 04/02/2009  . NAUSEA 02/09/2008  . FLANK PAIN, LEFT 02/09/2008  . Acute sinusitis, unspecified 10/19/2010  . Ingrown left big toenail 07/28/2013  . Body aches 04/06/2014  . Viral pharyngitis 09/20/2014  . Constipation 03/30/2015  . Influenza A 06/06/2015  . Stress at work 04/19/2016   Past Medical History:  Diagnosis Date  . Anxiety   . Depression   . Headache(784.0)   . History of recurrent UTIs   . Irritable bowel syndrome   . Kidney stones        Review of Systems  All other systems reviewed and are negative.      Objective:   Physical Exam  Constitutional: She is oriented to person, place, and time. She appears well-developed and well-nourished. No distress.  HENT:  Head: Normocephalic and atraumatic.  Right Ear: External ear normal.  Left Ear: External ear normal.  Nose: Nose normal.  Mouth/Throat: Oropharynx is clear and moist. No oropharyngeal exudate.  Right TM dullness at base appears to be fluid. No erythema, blood, or pus.  Nasal turbinates are red and swollen.   Severe tenderness  over right temple, maxillary sinus, and nasal bridge.  Mild tenderness over right mastoid.   Eyes:  Right eye slightly injected with watery discharge.   Neck: Normal range of motion. Neck supple.  Cardiovascular: Regular rhythm and normal heart sounds.  Tachycardia at 105 on recheck.   Pulmonary/Chest: Effort normal and breath sounds normal. She has no wheezes.  Lymphadenopathy:    She has no cervical  adenopathy.  Neurological: She is alert and oriented to person, place, and time. No cranial nerve deficit. Coordination normal.  Skin: Skin is dry. No rash noted. She is not diaphoretic.  Psychiatric: She has a normal mood and affect. Her behavior is normal.          Assessment & Plan:  Marland Kitchen.Marland Kitchen.Casey Sweeney was seen today for right ear pain.  Diagnoses and all orders for this visit:  Recurrent sinusitis -     Ambulatory referral to ENT  Right sided facial pain -     Ambulatory referral to ENT  Mastoid pain, right -     Ambulatory referral to ENT  Chronic maxillary sinusitis -     CT MAXILLOFACIAL LTD WO CM; Future -     amoxicillin-clavulanate (AUGMENTIN) 500-125 MG tablet; Take 1 tablet (500 mg total) by mouth 2 (two) times daily. For 3 weeks. -     Ambulatory referral to ENT  Tachycardia      DDX: chronic sinusitis, migraines, trigeminal neuralgia. Certainly I do think her chronic allergies are contributing in some way.Pt did have some tenderness over right mastoid but no ear infection. CT will get a better look at this.   STAT CT showed right sided air fluid levels and mucosal thickening. This is consistent with chronic sinusitis and correlates to the right sided pain. Start flonase daily. ENT referral made. augmentin 500/125mg  started bid for 3 weeks.   Discussed tachycardia. Likely could be combination of baseline being in the high 80's, taking a lot of decongestants, addition of stimulant, being in pain. We do need to monitor this. She does have an apple watch to do this. With exercise do not get above 220 minus age. Watch for resting going above 120's or being symptomatic. STOP all decongestants. Will remain on vyvanse with close follow up.   Pt has had sinus issues before started vyvanse. I do not think this is the cause. Right now she will increase dose with what she has to double and see how this is doing.   Follow up in 2 weeks.

## 2017-04-24 NOTE — Patient Instructions (Addendum)
Trigeminal neuralgia consideration vs chronic sinusitis.

## 2017-04-25 DIAGNOSIS — R000 Tachycardia, unspecified: Secondary | ICD-10-CM | POA: Insufficient documentation

## 2017-05-02 ENCOUNTER — Other Ambulatory Visit: Payer: Self-pay | Admitting: Physician Assistant

## 2017-05-02 ENCOUNTER — Encounter: Payer: Self-pay | Admitting: Physician Assistant

## 2017-05-02 DIAGNOSIS — F988 Other specified behavioral and emotional disorders with onset usually occurring in childhood and adolescence: Secondary | ICD-10-CM

## 2017-05-03 MED ORDER — LISDEXAMFETAMINE DIMESYLATE 40 MG PO CAPS: 40.0000 mg | ORAL_CAPSULE | ORAL | 0 refills | Status: DC

## 2017-05-08 ENCOUNTER — Encounter: Payer: Self-pay | Admitting: Physician Assistant

## 2017-05-08 ENCOUNTER — Ambulatory Visit (INDEPENDENT_AMBULATORY_CARE_PROVIDER_SITE_OTHER): Payer: BLUE CROSS/BLUE SHIELD | Admitting: Physician Assistant

## 2017-05-08 VITALS — BP 126/78 | HR 98 | Ht 68.0 in | Wt 143.0 lb

## 2017-05-08 DIAGNOSIS — F988 Other specified behavioral and emotional disorders with onset usually occurring in childhood and adolescence: Secondary | ICD-10-CM | POA: Diagnosis not present

## 2017-05-08 DIAGNOSIS — R000 Tachycardia, unspecified: Secondary | ICD-10-CM

## 2017-05-08 DIAGNOSIS — F411 Generalized anxiety disorder: Secondary | ICD-10-CM | POA: Diagnosis not present

## 2017-05-08 MED ORDER — LISDEXAMFETAMINE DIMESYLATE 40 MG PO CAPS: 40.0000 mg | ORAL_CAPSULE | ORAL | 0 refills | Status: DC

## 2017-05-08 NOTE — Progress Notes (Signed)
Subjective:    Patient ID: Casey MaiAmanda C Sweeney, female    DOB: 08/18/1980, 37 y.o.   MRN: [id number]  HPI  Patient is a 37 year old female who presents to the clinic for her 1 month follow-up on ADHD medication.  At last visit and discussed increasing Vyvanse to 40 mg.  Patient has done this for a little over 2 weeks.  She feels like she is on the appropriate dose.  She notes that she feels like she is getting more tasks around the house completed.  She feels overall more focused and alert.  She denies any increase in anxiety, insomnia, palpitations.  She is monitoring her heart rate.  Her heart rate was borderline elevated before the Vyvanse but she is continue to monitor it via her apple watch.  At her highest at rest was 118.  Most the time she is in the 90s.  She does mention that she does get aggravated very easily.  She denies any suicidal or homicidal thoughts.  .. Active Ambulatory Problems    Diagnosis Date Noted  . Encounter for IUD insertion 02/04/2012  . Anxiety 03/26/2012  . Depression 03/26/2012  . Patellofemoral syndrome of left knee 09/16/2012  . Right shoulder pain 07/28/2013  . Cubital tunnel syndrome on right 07/28/2013  . Family hx-breast malignancy 04/06/2014  . Breast tenderness in female 04/06/2014  . Fullness of breast 04/06/2014  . Radiculitis of right cervical region 03/22/2015  . Current smoker 03/30/2015  . Generalized abdominal pain 03/30/2015  . Capsulitis of metatarsophalangeal (MTP) joint of left foot 02/01/2016  . Onychomycosis 02/17/2016  . Stress at home 04/19/2016  . Irritable bowel syndrome with both constipation and diarrhea 05/25/2016  . GAD (generalized anxiety disorder) 12/17/2016  . Inattention 12/17/2016  . History of attention deficit hyperactivity disorder (ADHD) 12/18/2016  . ADD (attention deficit disorder) 02/20/2017  . Obsessive behaviors 02/20/2017  . Obsessive compulsive disorder 02/22/2017  . Recurrent sinusitis 04/24/2017  . Right  sided facial pain 04/24/2017  . Mastoid pain, right 04/24/2017  . Chronic maxillary sinusitis 04/24/2017  . Tachycardia 04/25/2017   Resolved Ambulatory Problems    Diagnosis Date Noted  . PHARYNGITIS, STREPTOCOCCAL 01/03/2010  . Acute maxillary sinusitis 04/02/2009  . NAUSEA 02/09/2008  . FLANK PAIN, LEFT 02/09/2008  . Acute sinusitis, unspecified 10/19/2010  . Ingrown left big toenail 07/28/2013  . Body aches 04/06/2014  . Viral pharyngitis 09/20/2014  . Constipation 03/30/2015  . Influenza A 06/06/2015  . Stress at work 04/19/2016   Past Medical History:  Diagnosis Date  . Anxiety   . Depression   . Headache(784.0)   . History of recurrent UTIs   . Irritable bowel syndrome   . Kidney stones       Review of Systems  All other systems reviewed and are negative.      Objective:   Physical Exam  Constitutional: She is oriented to person, place, and time. She appears well-developed and well-nourished.  HENT:  Head: Normocephalic and atraumatic.  Cardiovascular: Normal rate, regular rhythm and normal heart sounds.  Pulmonary/Chest: Effort normal and breath sounds normal.  Neurological: She is alert and oriented to person, place, and time.  Psychiatric: She has a normal mood and affect. Her behavior is normal.          Assessment & Plan:  Marland Kitchen.Marland Kitchen.Diagnoses and all orders for this visit:  Attention deficit disorder (ADD) without hyperactivity -     lisdexamfetamine (VYVANSE) 40 MG capsule; Take 1 capsule (40 mg total)  by mouth every morning.  Tachycardia  GAD (generalized anxiety disorder)    blood pressure and heart rate were elevated upon arrival.  After sitting for 5 minutes rechecked and her blood pressure was normal and her heart rate had decreased.  I certainly after looking at records can see that she has a baseline of elevated heart rate.  Does not seem to increase with Vyvanse.  Patient has had significant improvement with her overall symptoms.  Patient  denies any worsening or worrisome anxiety or depression.  Overall she feels like she is doing great.  Patient will check her blood pressure over the next 2 weeks and report back readings to me.  I want to confirm this is staying under 130/90.  Refilled Vyvanse 40 mg for 1 month.  Follow-up in 3 months.

## 2017-06-03 ENCOUNTER — Encounter: Payer: Self-pay | Admitting: Physician Assistant

## 2017-06-03 ENCOUNTER — Ambulatory Visit (INDEPENDENT_AMBULATORY_CARE_PROVIDER_SITE_OTHER): Payer: BLUE CROSS/BLUE SHIELD | Admitting: Physician Assistant

## 2017-06-03 VITALS — BP 130/89 | HR 96 | Ht 68.0 in | Wt 140.0 lb

## 2017-06-03 DIAGNOSIS — Z131 Encounter for screening for diabetes mellitus: Secondary | ICD-10-CM

## 2017-06-03 DIAGNOSIS — Z720 Tobacco use: Secondary | ICD-10-CM | POA: Diagnosis not present

## 2017-06-03 DIAGNOSIS — Z0000 Encounter for general adult medical examination without abnormal findings: Secondary | ICD-10-CM

## 2017-06-03 DIAGNOSIS — F988 Other specified behavioral and emotional disorders with onset usually occurring in childhood and adolescence: Secondary | ICD-10-CM

## 2017-06-03 DIAGNOSIS — Z13220 Encounter for screening for lipoid disorders: Secondary | ICD-10-CM

## 2017-06-03 MED ORDER — LISDEXAMFETAMINE DIMESYLATE 40 MG PO CAPS: 40.0000 mg | ORAL_CAPSULE | ORAL | 0 refills | Status: DC

## 2017-06-03 NOTE — Patient Instructions (Addendum)
Coping with Quitting Smoking Quitting smoking is a physical and mental challenge. You will face cravings, withdrawal symptoms, and temptation. Before quitting, work with your health care provider to make a plan that can help you cope. Preparation can help you quit and keep you from giving in. How can I cope with cravings? Cravings usually last for 5-10 minutes. If you get through it, the craving will pass. Consider taking the following actions to help you cope with cravings:  Keep your mouth busy: ? Chew sugar-free gum. ? Suck on hard candies or a straw. ? Brush your teeth.  Keep your hands and body busy: ? Immediately change to a different activity when you feel a craving. ? Squeeze or play with a ball. ? Do an activity or a hobby, like making bead jewelry, practicing needlepoint, or working with wood. ? Mix up your normal routine. ? Take a short exercise break. Go for a quick walk or run up and down stairs. ? Spend time in public places where smoking is not allowed.  Focus on doing something kind or helpful for someone else.  Call a friend or family member to talk during a craving.  Join a support group.  Call a quit line, such as 1-800-QUIT-NOW.  Talk with your health care provider about medicines that might help you cope with cravings and make quitting easier for you.  How can I deal with withdrawal symptoms? Your body may experience negative effects as it tries to get used to not having nicotine in the system. These effects are called withdrawal symptoms. They may include:  Feeling hungrier than normal.  Trouble concentrating.  Irritability.  Trouble sleeping.  Feeling depressed.  Restlessness and agitation.  Craving a cigarette.  To manage withdrawal symptoms:  Avoid places, people, and activities that trigger your cravings.  Remember why you want to quit.  Get plenty of sleep.  Avoid coffee and other caffeinated drinks. These may worsen some of your  symptoms.  How can I handle social situations? Social situations can be difficult when you are quitting smoking, especially in the first few weeks. To manage this, you can:  Avoid parties, bars, and other social situations where people might be smoking.  Avoid alcohol.  Leave right away if you have the urge to smoke.  Explain to your family and friends that you are quitting smoking. Ask for understanding and support.  Plan activities with friends or family where smoking is not an option.  What are some ways I can cope with stress? Wanting to smoke may cause stress, and stress can make you want to smoke. Find ways to manage your stress. Relaxation techniques can help. For example:  Breathe slowly and deeply, in through your nose and out through your mouth.  Listen to soothing, relaxing music.  Talk with a family member or friend about your stress.  Light a candle.  Soak in a bath or take a shower.  Think about a peaceful place.  What are some ways I can prevent weight gain? Be aware that many people gain weight after they quit smoking. However, not everyone does. To keep from gaining weight, have a plan in place before you quit and stick to the plan after you quit. Your plan should include:  Having healthy snacks. When you have a craving, it may help to: ? Eat plain popcorn, crunchy carrots, celery, or other cut vegetables. ? Chew sugar-free gum.  Changing how you eat: ? Eat small portion sizes at meals. ?   Eat 4-6 small meals throughout the day instead of 1-2 large meals a day. ? Be mindful when you eat. Do not watch television or do other things that might distract you as you eat.  Exercising regularly: ? Make time to exercise each day. If you do not have time for a long workout, do short bouts of exercise for 5-10 minutes several times a day. ? Do some form of strengthening exercise, like weight lifting, and some form of aerobic exercise, like running or  swimming.  Drinking plenty of water or other low-calorie or no-calorie drinks. Drink 6-8 glasses of water daily, or as much as instructed by your health care provider.  Summary  Quitting smoking is a physical and mental challenge. You will face cravings, withdrawal symptoms, and temptation to smoke again. Preparation can help you as you go through these challenges.  You can cope with cravings by keeping your mouth busy (such as by chewing gum), keeping your body and hands busy, and making calls to family, friends, or a helpline for people who want to quit smoking.  You can cope with withdrawal symptoms by avoiding places where people smoke, avoiding drinks with caffeine, and getting plenty of rest.  Ask your health care provider about the different ways to prevent weight gain, avoid stress, and handle social situations. This information is not intended to replace advice given to you by your health care provider. Make sure you discuss any questions you have with your health care provider. Document Released: 02/24/2016 Document Revised: 02/24/2016 Document Reviewed: 02/24/2016 Elsevier Interactive Patient Education  2018 [employer].   Keeping You Healthy  Get These Tests 1. Blood Pressure- Have your blood pressure checked once a year by your health care provider.  Normal blood pressure is 120/80. 2. Weight- Have your body mass index (BMI) calculated to screen for obesity.  BMI is measure of body fat based on height and weight.  You can also calculate your own BMI at https://www.west-esparza.com/. 3. Cholesterol- Have your cholesterol checked every 5 years starting at age 31 then yearly starting at age 70. 4. Chlamydia, HIV, and other sexually transmitted diseases- Get screened every year until age 89, then within three months of each new sexual provider. 5. Pap Test - Every 1-5 years; discuss with your health care provider. 6. Mammogram- Every 1-2 years starting at age 75--50  Take these  medicines  Calcium with Vitamin D-Your body needs 1200 mg of Calcium each day and [phone] IU of Vitamin D daily.  Your body can only absorb 500 mg of Calcium at a time so Calcium must be taken in 2 or 3 divided doses throughout the day.  Multivitamin with folic acid- Once daily if it is possible for you to become pregnant.  Get these Immunizations  Gardasil-Series of three doses; prevents HPV related illness such as genital warts and cervical cancer.  Menactra-Single dose; prevents meningitis.  Tetanus shot- Every 10 years.  Flu shot-Every year.  Take these steps 1. Do not smoke-Your healthcare provider can help you quit.  For tips on how to quit go to www.smokefree.gov or call 1-800 QUITNOW. 2. Be physically active- Exercise 5 days a week for at least 30 minutes.  If you are not already physically active, start slow and gradually work up to 30 minutes of moderate physical activity.  Examples of moderate activity include walking briskly, dancing, swimming, bicycling, etc. 3. Breast Cancer- A self breast exam every month is important for early detection of breast cancer.  For more information and instruction on self breast exams, ask your healthcare provider or [email or web address]. 4. Eat a healthy diet- Eat a variety of healthy foods such as fruits, vegetables, whole grains, low fat milk, low fat cheeses, yogurt, lean meats, poultry and fish, beans, nuts, tofu, etc.  For more information go to www. Thenutritionsource.org 5. Drink alcohol in moderation- Limit alcohol intake to one drink or less per day. Never drink and drive. 6. Depression- Your emotional health is as important as your physical health.  If you're feeling down or losing interest in things you normally enjoy please talk to your healthcare provider about being screened for depression. 7. Dental visit- Brush and floss your teeth twice daily; visit your dentist twice a year. 8. Eye doctor- Get an eye exam  at least every 2 years. 9. Helmet use- Always wear a helmet when riding a bicycle, motorcycle, rollerblading or skateboarding. 10. Safe sex- If you may be exposed to sexually transmitted infections, use a condom. 11. Seat belts- Seat belts can save your live; always wear one. 12. Smoke/Carbon Monoxide detectors- These detectors need to be installed on the appropriate level of your home. Replace batteries at least once a year. 13. Skin cancer- When out in the sun please cover up and use sunscreen 15 SPF or higher. 14. Violence- If anyone is threatening or hurting you, please tell your healthcare provider.       Keeping You Healthy  Get These Tests 7. Blood Pressure- Have your blood pressure checked once a year by your health care provider.  Normal blood pressure is 120/80. 8. Weight- Have your body mass index (BMI) calculated to screen for obesity.  BMI is measure of body fat based on height and weight.  You can also calculate your own BMI at https://www.west-esparza.com/. 9. Cholesterol- Have your cholesterol checked every 5 years starting at age 33 then yearly starting at age 5. 24. Chlamydia, HIV, and other sexually transmitted diseases- Get screened every year until age 44, then within three months of each new sexual provider. 11. Pap Test - Every 1-5 years; discuss with your health care provider. 12. Mammogram- Every 1-2 years starting at age 56--50  Take these medicines  Calcium with Vitamin D-Your body needs 1200 mg of Calcium each day and [phone] IU of Vitamin D daily.  Your body can only absorb 500 mg of Calcium at a time so Calcium must be taken in 2 or 3 divided doses throughout the day.  Multivitamin with folic acid- Once daily if it is possible for you to become pregnant.  Get these Immunizations  Gardasil-Series of three doses; prevents HPV related illness such as genital warts and cervical cancer.  Menactra-Single dose; prevents meningitis.  Tetanus shot- Every 10  years.  Flu shot-Every year.  Take these steps 15. Do not smoke-Your healthcare provider can help you quit.  For tips on how to quit go to www.smokefree.gov or call 1-800 QUITNOW. 16. Be physically active- Exercise 5 days a week for at least 30 minutes.  If you are not already physically active, start slow and gradually work up to 30 minutes of moderate physical activity.  Examples of moderate activity include walking briskly, dancing, swimming, bicycling, etc. 17. Breast Cancer- A self breast exam every month is important for early detection of breast cancer.  For more information and instruction on self breast exams, ask your healthcare provider or [email or web address]. 18. Eat a healthy diet- Eat a variety of healthy foods such as fruits, vegetables, whole  grains, low fat milk, low fat cheeses, yogurt, lean meats, poultry and fish, beans, nuts, tofu, etc.  For more information go to www. Thenutritionsource.org 19. Drink alcohol in moderation- Limit alcohol intake to one drink or less per day. Never drink and drive. 20. Depression- Your emotional health is as important as your physical health.  If you're feeling down or losing interest in things you normally enjoy please talk to your healthcare provider about being screened for depression. 21. Dental visit- Brush and floss your teeth twice daily; visit your dentist twice a year. 22. Eye doctor- Get an eye exam at least every 2 years. 23. Helmet use- Always wear a helmet when riding a bicycle, motorcycle, rollerblading or skateboarding. 24. Safe sex- If you may be exposed to sexually transmitted infections, use a condom. 25. Seat belts- Seat belts can save your live; always wear one. 26. Smoke/Carbon Monoxide detectors- These detectors need to be installed on the appropriate level of your home. Replace batteries at least once a year. 27. Skin cancer- When out in the sun please cover up and use sunscreen 15 SPF or  higher. 28. Violence- If anyone is threatening or hurting you, please tell your healthcare provider.

## 2017-06-03 NOTE — Progress Notes (Signed)
Subjective:     Casey Sweeney is a 37 y.o. female and is here for a comprehensive physical exam. The patient reports no problems.  She is trying to cut back smoking. She is down to a good day of 6-7 cigs and a bad day of 10-12 cigs.   Needs refill on vyvanse. Feels like this dose is very effective. HR is increase a bit but goes down with rest. No increase in anxiety, insomnia, palpitations.    Social History   Socioeconomic History  . Marital status: Married    Spouse name: Not on file  . Number of children: Not on file  . Years of education: Not on file  . Highest education level: Not on file  Occupational History  . Occupation: unemployed  Social Needs  . Financial resource strain: Not on file  . Food insecurity:    Worry: Not on file    Inability: Not on file  . Transportation needs:    Medical: Not on file    Non-medical: Not on file  Tobacco Use  . Smoking status: Current Every Day Smoker    Packs/day: 1.00    Years: 10.00    Pack years: 10.00    Types: Cigarettes    Last attempt to quit: 04/12/2016    Years since quitting: 1.1  . Smokeless tobacco: Never Used  Substance and Sexual Activity  . Alcohol use: No  . Drug use: No  . Sexual activity: Yes    Partners: Male    Birth control/protection: IUD  Lifestyle  . Physical activity:    Days per week: Not on file    Minutes per session: Not on file  . Stress: Not on file  Relationships  . Social connections:    Talks on phone: Not on file    Gets together: Not on file    Attends religious service: Not on file    Active member of club or organization: Not on file    Attends meetings of clubs or organizations: Not on file    Relationship status: Not on file  . Intimate partner violence:    Fear of current or ex partner: Not on file    Emotionally abused: Not on file    Physically abused: Not on file    Forced sexual activity: Not on file  Other Topics Concern  . Not on file  Social History Narrative  .  Not on file   Health Maintenance  Topic Date Due  . PAP SMEAR  02/05/2020  . TETANUS/TDAP  05/23/2025  . INFLUENZA VACCINE  Completed  . HIV Screening  Completed    The following portions of the patient's history were reviewed and updated as appropriate: allergies, current medications, past family history, past medical history, past social history, past surgical history and problem list.  Review of Systems A comprehensive review of systems was negative.   Objective:    BP 130/89   Pulse 96   Ht 5\' 8"  (1.727 m)   Wt 140 lb (63.5 kg)   BMI 21.29 kg/m  General appearance: alert, cooperative and appears stated age Head: Normocephalic, without obvious abnormality, atraumatic Eyes: conjunctivae/corneas clear. PERRL, EOM's intact. Fundi benign. Ears: normal TM's and external ear canals both ears Nose: Nares normal. Septum midline. Mucosa normal. No drainage or sinus tenderness. Throat: lips, mucosa, and tongue normal; teeth and gums normal Neck: no adenopathy, no carotid bruit, no JVD, supple, symmetrical, trachea midline and thyroid not enlarged, symmetric, no tenderness/mass/nodules Back:  symmetric, no curvature. ROM normal. No CVA tenderness. Lungs: clear to auscultation bilaterally Breasts: normal appearance, no masses or tenderness Heart: regular rate and rhythm, S1, S2 normal, no murmur, click, rub or gallop Abdomen: soft, non-tender; bowel sounds normal; no masses,  no organomegaly Extremities: extremities normal, atraumatic, no cyanosis or edema Pulses: 2+ and symmetric Skin: Skin color, texture, turgor normal. No rashes or lesions Lymph nodes: Cervical, supraclavicular, and axillary nodes normal. Neurologic: Alert and oriented X 3, normal strength and tone. Normal symmetric reflexes. Normal coordination and gait    Assessment:    Healthy female exam.      Plan:  Marland Kitchen.Marland Kitchen.Casey Sweeney was seen today for annual exam.  Diagnoses and all orders for this visit:  Routine physical  examination -     Lipid Panel w/reflex Direct LDL -     COMPLETE METABOLIC PANEL WITH GFR  Attention deficit disorder (ADD) without hyperactivity -     lisdexamfetamine (VYVANSE) 40 MG capsule; Take 1 capsule (40 mg total) by mouth every morning. -     lisdexamfetamine (VYVANSE) 40 MG capsule; Take 1 capsule (40 mg total) by mouth every morning. -     lisdexamfetamine (VYVANSE) 40 MG capsule; Take 1 capsule (40 mg total) by mouth every morning.  Screening for diabetes mellitus -     COMPLETE METABOLIC PANEL WITH GFR  Screening for lipid disorders -     Lipid Panel w/reflex Direct LDL  Currently attempting to quit smoking   .Marland Kitchen. Depression screen [facility] 2/9 06/03/2017 04/12/2017 03/29/2017 12/17/2016 05/23/2016  Decreased Interest 1 1 2 1 1   Down, Depressed, Hopeless 1 1 2 1 1   PHQ - 2 Score 2 2 4 2 2   Altered sleeping 1 2 3  - -  Tired, decreased energy 2 2 3  - -  Change in appetite 1 1 0 - -  Feeling bad or failure about yourself  0 2 2 - -  Trouble concentrating 0 0 2 - -  Moving slowly or fidgety/restless 0 0 0 - -  Suicidal thoughts 0 0 0 - -  PHQ-9 Score 6 9 14  - -  Difficult doing work/chores Somewhat difficult - - - -   .Marland Kitchen. Discussed 150 minutes of exercise a week.  Encouraged vitamin D 1000 units and Calcium 1300mg  or 4 servings of dairy a day.  Fasting labs ordered today.  Rechecked HR improved on 2nd recheck,  Encouraged patient to continue to cut back on cigarettes.  Refilled vyvanse for 3 months.    See After Visit Summary for Counseling Recommendations

## 2017-06-05 DIAGNOSIS — Z131 Encounter for screening for diabetes mellitus: Secondary | ICD-10-CM | POA: Diagnosis not present

## 2017-06-05 DIAGNOSIS — Z13220 Encounter for screening for lipoid disorders: Secondary | ICD-10-CM | POA: Diagnosis not present

## 2017-06-05 DIAGNOSIS — Z0000 Encounter for general adult medical examination without abnormal findings: Secondary | ICD-10-CM | POA: Diagnosis not present

## 2017-06-05 LAB — COMPLETE METABOLIC PANEL WITH GFR
AG Ratio: 2.3 (calc) (ref 1.0–2.5)
ALKALINE PHOSPHATASE (APISO): 67 U/L (ref 33–115)
ALT: 13 U/L (ref 6–29)
AST: 12 U/L (ref 10–30)
Albumin: 4.5 g/dL (ref 3.6–5.1)
BILIRUBIN TOTAL: 0.6 mg/dL (ref 0.2–1.2)
BUN: 16 mg/dL (ref 7–25)
CHLORIDE: 107 mmol/L (ref 98–110)
CO2: 29 mmol/L (ref 20–32)
CREATININE: 0.93 mg/dL (ref 0.50–1.10)
Calcium: 9.4 mg/dL (ref 8.6–10.2)
GFR, Est African American: 93 mL/min/{1.73_m2} (ref >=60)
GFR, Est Non African American: 80 mL/min/{1.73_m2} (ref >=60)
GLUCOSE: 92 mg/dL (ref 65–99)
Globulin: 2 g/dL (calc) (ref 1.9–3.7)
Potassium: 3.8 mmol/L (ref 3.5–5.3)
Sodium: 140 mmol/L (ref 135–146)
Total Protein: 6.5 g/dL (ref 6.1–8.1)

## 2017-06-05 LAB — LIPID PANEL W/REFLEX DIRECT LDL
CHOL/HDL RATIO: 5.4 (calc) — AB (ref <5.0)
Cholesterol: 152 mg/dL (ref <200)
HDL: 28 mg/dL — ABNORMAL LOW (ref >50)
LDL CHOLESTEROL (CALC): 107 mg/dL — AB
NON-HDL CHOLESTEROL (CALC): 124 mg/dL (ref <130)
Triglycerides: 78 mg/dL (ref <150)

## 2017-06-05 NOTE — Progress Notes (Signed)
Call pt: cholesterol looks good. HDL is low. Need to stop smoking and exercise. Kidney, liver, glucose look great.

## 2017-06-11 ENCOUNTER — Encounter: Payer: Self-pay | Admitting: Physician Assistant

## 2017-06-13 ENCOUNTER — Encounter: Payer: Self-pay | Admitting: Physician Assistant

## 2017-06-13 ENCOUNTER — Ambulatory Visit (INDEPENDENT_AMBULATORY_CARE_PROVIDER_SITE_OTHER): Payer: BLUE CROSS/BLUE SHIELD | Admitting: Physician Assistant

## 2017-06-13 VITALS — BP 136/79 | HR 86 | Temp 98.2°F | Ht 68.0 in | Wt 141.0 lb

## 2017-06-13 DIAGNOSIS — J329 Chronic sinusitis, unspecified: Secondary | ICD-10-CM

## 2017-06-13 DIAGNOSIS — J320 Chronic maxillary sinusitis: Secondary | ICD-10-CM

## 2017-06-13 MED ORDER — AMOXICILLIN-POT CLAVULANATE 875-125 MG PO TABS: 1.0000 | ORAL_TABLET | Freq: Two times a day (BID) | ORAL | 0 refills | Status: DC

## 2017-06-13 MED ORDER — METHYLPREDNISOLONE SODIUM SUCC 125 MG IJ SOLR
125.0000 mg | Freq: Once | INTRAMUSCULAR | Status: AC
  Administered 2017-06-13: 125 mg via INTRAMUSCULAR

## 2017-06-13 MED ORDER — METHYLPREDNISOLONE 4 MG PO TBPK: ORAL_TABLET | ORAL | 0 refills | Status: DC

## 2017-06-13 NOTE — Progress Notes (Signed)
Subjective:    Patient ID: Casey Sweeney, female    DOB: 11-19-1980, 37 y.o.   MRN: [id number]  HPI Casey Sweeney presents today for recurrent sinusitis. This episode started about a week ago with sinus pain/pressure, congestion, sore throat.  She states this is the worse one she has ever had. She actually cried with pain.She has a history of chronic sinusitis and recently finished a 4 week course of antibiotics for this after CT showed chronic sinutisits in right maxillary sinus. . She has an appointment scheduled with ENT on April 25th. She is taking zyrtec and flonase daily. No SOB, wheezing.   .. Active Ambulatory Problems    Diagnosis Date Noted  . Encounter for IUD insertion 02/04/2012  . Anxiety 03/26/2012  . Depression 03/26/2012  . Patellofemoral syndrome of left knee 09/16/2012  . Right shoulder pain 07/28/2013  . Cubital tunnel syndrome on right 07/28/2013  . Family hx-breast malignancy 04/06/2014  . Breast tenderness in female 04/06/2014  . Fullness of breast 04/06/2014  . Radiculitis of right cervical region 03/22/2015  . Currently attempting to quit smoking 03/30/2015  . Generalized abdominal pain 03/30/2015  . Capsulitis of metatarsophalangeal (MTP) joint of left foot 02/01/2016  . Onychomycosis 02/17/2016  . Stress at home 04/19/2016  . Irritable bowel syndrome with both constipation and diarrhea 05/25/2016  . GAD (generalized anxiety disorder) 12/17/2016  . Inattention 12/17/2016  . History of attention deficit hyperactivity disorder (ADHD) 12/18/2016  . ADD (attention deficit disorder) 02/20/2017  . Obsessive behaviors 02/20/2017  . Obsessive compulsive disorder 02/22/2017  . Recurrent sinusitis 04/24/2017  . Right sided facial pain 04/24/2017  . Mastoid pain, right 04/24/2017  . Chronic maxillary sinusitis 04/24/2017  . Tachycardia 04/25/2017   Resolved Ambulatory Problems    Diagnosis Date Noted  . PHARYNGITIS, STREPTOCOCCAL 01/03/2010  . Acute maxillary  sinusitis 04/02/2009  . NAUSEA 02/09/2008  . FLANK PAIN, LEFT 02/09/2008  . Acute sinusitis, unspecified 10/19/2010  . Ingrown left big toenail 07/28/2013  . Body aches 04/06/2014  . Viral pharyngitis 09/20/2014  . Constipation 03/30/2015  . Influenza A 06/06/2015  . Stress at work 04/19/2016   Past Medical History:  Diagnosis Date  . Anxiety   . Depression   . Headache(784.0)   . History of recurrent UTIs   . Irritable bowel syndrome   . Kidney stones       Review of Systems  Constitutional: Negative for chills and fever.  HENT: Positive for congestion, postnasal drip, rhinorrhea, sinus pressure, sinus pain and sore throat.   Respiratory: Negative for cough and shortness of breath.   Gastrointestinal: Negative for vomiting.       Objective:   Physical Exam  Constitutional: She is oriented to person, place, and time. She appears well-developed and well-nourished.  HENT:  Head: Normocephalic and atraumatic.  Right Ear: Tympanic membrane and external ear normal.  Left Ear: Tympanic membrane and external ear normal.  Nose: Right sinus exhibits maxillary sinus tenderness ( R>L). Left sinus exhibits maxillary sinus tenderness.  Mouth/Throat: No oropharyngeal exudate, posterior oropharyngeal edema or posterior oropharyngeal erythema.  Neck: Normal range of motion. Neck supple.  Cardiovascular: Normal rate, regular rhythm, normal heart sounds and intact distal pulses.  Pulmonary/Chest: Effort normal and breath sounds normal. No respiratory distress. She has no wheezes.  Lymphadenopathy:    She has no cervical adenopathy.  Neurological: She is alert and oriented to person, place, and time.  Skin: Skin is warm and dry.  Psychiatric: She has a  normal mood and affect. Her behavior is normal.  Nursing note and vitals reviewed.         Assessment & Plan:  Marland Kitchen.Marland Kitchen.Diagnoses and all orders for this visit:  Chronic maxillary sinusitis -     amoxicillin-clavulanate (AUGMENTIN)  875-125 MG tablet; Take 1 tablet by mouth 2 (two) times daily. For 10 days. -     methylPREDNISolone (MEDROL DOSEPAK) 4 MG TBPK tablet; Take as directed by package insert -     methylPREDNISolone sodium succinate (SOLU-MEDROL) 125 mg/2 mL injection 125 mg  Recurrent sinusitis -     amoxicillin-clavulanate (AUGMENTIN) 875-125 MG tablet; Take 1 tablet by mouth 2 (two) times daily. For 10 days. -     methylPREDNISolone (MEDROL DOSEPAK) 4 MG TBPK tablet; Take as directed by package insert -     methylPREDNISolone sodium succinate (SOLU-MEDROL) 125 mg/2 mL injection 125 mg   Patient was given shot of Solu-Medrol in the office today and told to start Medrol Dosepak tomorrow.  I suspect most of her sinusitis could be induced by allergies.  She is on an antihistamine and taking Flonase daily.  Certainly her symptoms are recurrent in nature and CT has confirmed chronic mucosal thickening in the right maxillary sinus.  She has an ENT appointment for later this month urged her to keep this.  Follow-up as needed.

## 2017-06-13 NOTE — Patient Instructions (Signed)

## 2017-07-04 DIAGNOSIS — J329 Chronic sinusitis, unspecified: Secondary | ICD-10-CM | POA: Diagnosis not present

## 2017-07-05 ENCOUNTER — Other Ambulatory Visit: Payer: Self-pay | Admitting: Physician Assistant

## 2017-07-05 DIAGNOSIS — F988 Other specified behavioral and emotional disorders with onset usually occurring in childhood and adolescence: Secondary | ICD-10-CM

## 2017-07-06 ENCOUNTER — Encounter: Payer: Self-pay | Admitting: Physician Assistant

## 2017-07-08 NOTE — Telephone Encounter (Signed)
[facility] pharmacy and spoke with Arturo Morton who advised me that RX was on file and has already been filled and picked up by patient 2 days ago. Disregarding this request.   Left pt msg on ID'd VM advising her that 3 months of RX were written in March and she should not need [place] to send another RX until June. Call back info provided for any further questions.

## 2017-07-08 NOTE — Telephone Encounter (Signed)
Called pharmacy, Pt picked up Rx on 07/06/17.

## 2017-07-15 ENCOUNTER — Encounter: Payer: Self-pay | Admitting: Physician Assistant

## 2017-07-16 ENCOUNTER — Encounter: Payer: Self-pay | Admitting: Physician Assistant

## 2017-07-16 ENCOUNTER — Ambulatory Visit (INDEPENDENT_AMBULATORY_CARE_PROVIDER_SITE_OTHER): Payer: BLUE CROSS/BLUE SHIELD | Admitting: Physician Assistant

## 2017-07-16 VITALS — BP 135/90 | HR 103 | Temp 98.1°F | Wt 138.0 lb

## 2017-07-16 DIAGNOSIS — R3129 Other microscopic hematuria: Secondary | ICD-10-CM

## 2017-07-16 DIAGNOSIS — R82998 Other abnormal findings in urine: Secondary | ICD-10-CM

## 2017-07-16 DIAGNOSIS — R8290 Unspecified abnormal findings in urine: Secondary | ICD-10-CM | POA: Diagnosis not present

## 2017-07-16 LAB — POCT URINALYSIS DIPSTICK
Bilirubin, UA: NEGATIVE
Glucose, UA: NEGATIVE
KETONES UA: NEGATIVE
NITRITE UA: NEGATIVE
PH UA: 6 (ref 5.0–8.0)
Protein, UA: NEGATIVE
Spec Grav, UA: <=1.005 — AB (ref 1.010–1.025)
UROBILINOGEN UA: 0.2 U/dL

## 2017-07-16 MED ORDER — NITROFURANTOIN MONOHYD MACRO 100 MG PO CAPS: 100.0000 mg | ORAL_CAPSULE | Freq: Two times a day (BID) | ORAL | 0 refills | Status: AC

## 2017-07-16 NOTE — Patient Instructions (Signed)

## 2017-07-16 NOTE — Progress Notes (Signed)
HPI:                                                                Casey Sweeney is a 37 y.o. female who presents to [facility] Casey Sweeney: Primary Care Sports Medicine today for urinary odor  HPI Presents with 1 week of malodorous urine. Denies dysuria, cloudiness, hematuria, frequency, urgency, hesitancy. Denies vaginal discharge, abnormal bleeding or pelvic pain. She has a remote history of recurrent UTI. Sexually active with 1 female partner. IUD for contraception.    Past Medical History:  Diagnosis Date  . Anxiety   . Depression   . Headache(784.0)    migraines  . History of recurrent UTIs   . Irritable bowel syndrome   . Kidney stones    Past Surgical History:  Procedure Laterality Date  . CHOLECYSTECTOMY  10/17/12   lap chole  . CHOLECYSTECTOMY N/A 10/17/2012   Procedure: LAPAROSCOPIC CHOLECYSTECTOMY;  Surgeon: Axel Filler, MD;  Location: [facility] OR;  Service: General;  Laterality: N/A;  . NO PAST SURGERIES    . SHOULDER SURGERY Right    Social History   Tobacco Use  . Smoking status: Current Every Day Smoker    Packs/day: 1.00    Years: 10.00    Pack years: 10.00    Types: Cigarettes    Last attempt to quit: 04/12/2016    Years since quitting: 1.2  . Smokeless tobacco: Never Used  Substance Use Topics  . Alcohol use: No   family history includes Cancer in her father, maternal grandfather, and paternal aunt; Cancer (age of onset: 97) in her other; Hypertension in her mother.    ROS: negative except as noted in the HPI  Medications: Current Outpatient Medications  Medication Sig Dispense Refill  . buPROPion (WELLBUTRIN XL) 300 MG 24 hr tablet Take 1 tablet (300 mg total) by mouth daily. 90 tablet 1  . lisdexamfetamine (VYVANSE) 40 MG capsule Take 1 capsule (40 mg total) by mouth every morning. 30 capsule 0  . lisdexamfetamine (VYVANSE) 40 MG capsule Take 1 capsule (40 mg total) by mouth every morning. 30 capsule 0  . [START ON 08/03/2017]  lisdexamfetamine (VYVANSE) 40 MG capsule Take 1 capsule (40 mg total) by mouth every morning. 30 capsule 0  . methylPREDNISolone (MEDROL DOSEPAK) 4 MG TBPK tablet Take as directed by package insert 21 tablet 0  . nitrofurantoin, macrocrystal-monohydrate, (MACROBID) 100 MG capsule Take 1 capsule (100 mg total) by mouth 2 (two) times daily for 5 days. 10 capsule 0   No current facility-administered medications for this visit.    Allergies  Allergen Reactions  . Lexapro [Escitalopram Oxalate]     Worsening depression.   Wilhemena Durie [Atomoxetine Hcl]     Heart burn       Objective:  BP 135/90   Pulse (!) 103   Temp 98.1 F (36.7 C) (Oral)   Wt 138 lb (62.6 kg)   BMI 20.98 kg/m  Gen:  alert, not ill-appearing, no distress, appropriate for age HEENT: head normocephalic without obvious abnormality, conjunctiva and cornea clear, trachea midline Pulm: Normal work of breathing, normal phonation Neuro: alert and oriented x 3, no tremor MSK: extremities atraumatic, normal gait and station Skin: intact, no rashes on exposed skin, no jaundice, no cyanosis  Psych: well-groomed, cooperative, good eye contact, euthymic mood, affect mood-congruent, speech is articulate, and thought processes clear and goal-directed    Results for orders placed or performed in visit on 07/16/17 (from the past 72 hour(s))  POCT Urinalysis Dipstick     Status: Abnormal   Collection Time: 07/16/17  4:18 PM  Result Value Ref Range   Color, UA yellow    Clarity, UA clear    Glucose, UA negative    Bilirubin, UA negative    Ketones, UA negative    Spec Grav, UA <=1.005 (A) 1.010 - 1.025   Blood, UA small    pH, UA 6.0 5.0 - 8.0   Protein, UA negative    Urobilinogen, UA 0.2 0.2 or 1.0 E.U./dL   Nitrite, UA negative    Leukocytes, UA Trace (A) Negative   Appearance     Odor     No results found.    Assessment and Plan: 37 y.o. female with   Abnormal urine odor - Plan: Urine Culture, POCT Urinalysis  Dipstick, nitrofurantoin, macrocrystal-monohydrate, (MACROBID) 100 MG capsule  Leukocytes in urine - Plan: nitrofurantoin, macrocrystal-monohydrate, (MACROBID) 100 MG capsule  Microscopic hematuria - Plan: nitrofurantoin, macrocrystal-monohydrate, (MACROBID) 100 MG capsule  UA positive for trace leuks and blood. Treating empirically for uncomplicated cystitis. Urine culture pending.    Patient education and anticipatory guidance given Patient agrees with treatment plan Follow-up as needed if symptoms worsen or fail to improve  Levonne Hubert PA-C

## 2017-07-18 LAB — URINE CULTURE
MICRO NUMBER: 90557314
SPECIMEN QUALITY: ADEQUATE

## 2017-07-18 NOTE — Progress Notes (Signed)
Hi Zriyah,  Your urine culture was positive for a bladder infection. The antibiotic you received should treat this. If you are still having symptoms after completing the antibiotic let us know.  Best, Vinetta Bergamo

## 2017-09-04 ENCOUNTER — Other Ambulatory Visit: Payer: Self-pay | Admitting: Physician Assistant

## 2017-09-04 DIAGNOSIS — F988 Other specified behavioral and emotional disorders with onset usually occurring in childhood and adolescence: Secondary | ICD-10-CM

## 2017-09-04 MED ORDER — LISDEXAMFETAMINE DIMESYLATE 40 MG PO CAPS: 40.0000 mg | ORAL_CAPSULE | ORAL | 0 refills | Status: DC

## 2017-09-17 ENCOUNTER — Encounter: Payer: Self-pay | Admitting: Physician Assistant

## 2017-10-08 ENCOUNTER — Other Ambulatory Visit: Payer: Self-pay | Admitting: Physician Assistant

## 2017-10-08 DIAGNOSIS — F988 Other specified behavioral and emotional disorders with onset usually occurring in childhood and adolescence: Secondary | ICD-10-CM

## 2017-10-09 NOTE — Telephone Encounter (Signed)
Need follow up appt every 3 month. Are you completely out?

## 2017-11-12 ENCOUNTER — Other Ambulatory Visit: Payer: Self-pay | Admitting: Physician Assistant

## 2017-11-12 DIAGNOSIS — F988 Other specified behavioral and emotional disorders with onset usually occurring in childhood and adolescence: Secondary | ICD-10-CM

## 2017-11-12 MED ORDER — LISDEXAMFETAMINE DIMESYLATE 40 MG PO CAPS: 40.0000 mg | ORAL_CAPSULE | ORAL | 0 refills | Status: DC

## 2017-11-12 NOTE — Progress Notes (Signed)
Pt will make appt in the next month.

## 2017-11-18 ENCOUNTER — Encounter: Payer: Self-pay | Admitting: Physician Assistant

## 2017-11-18 ENCOUNTER — Ambulatory Visit (INDEPENDENT_AMBULATORY_CARE_PROVIDER_SITE_OTHER): Payer: BLUE CROSS/BLUE SHIELD | Admitting: Physician Assistant

## 2017-11-18 VITALS — BP 123/68 | HR 105 | Ht 68.0 in | Wt 125.0 lb

## 2017-11-18 DIAGNOSIS — F329 Major depressive disorder, single episode, unspecified: Secondary | ICD-10-CM

## 2017-11-18 DIAGNOSIS — R634 Abnormal weight loss: Secondary | ICD-10-CM

## 2017-11-18 DIAGNOSIS — Z23 Encounter for immunization: Secondary | ICD-10-CM | POA: Diagnosis not present

## 2017-11-18 DIAGNOSIS — F419 Anxiety disorder, unspecified: Secondary | ICD-10-CM | POA: Diagnosis not present

## 2017-11-18 DIAGNOSIS — F988 Other specified behavioral and emotional disorders with onset usually occurring in childhood and adolescence: Secondary | ICD-10-CM

## 2017-11-18 MED ORDER — LISDEXAMFETAMINE DIMESYLATE 40 MG PO CAPS: 40.0000 mg | ORAL_CAPSULE | ORAL | 0 refills | Status: DC

## 2017-11-18 MED ORDER — BUPROPION HCL ER (XL) 300 MG PO TB24: 300.0000 mg | ORAL_TABLET | Freq: Every day | ORAL | 1 refills | Status: DC

## 2017-11-18 NOTE — Progress Notes (Signed)
Subjective:    Patient ID: Casey Sweeney, female    DOB: 06-27-1980, 37 y.o.   MRN: [id number]  HPI Pt is a 37 yo female with ADD who presents to the clinic for follow up.   She is doing great with vyvanse at 40mg  dose. She continues to take wellbutrin for depression. She denies any SI/HC. She is being very productive and happy. She is losing weight she has lost 15lbs since April. She does eat 3 meals a day. She is walking daily. She is sleeping 7-8 hours a night.   .. Active Ambulatory Problems    Diagnosis Date Noted  . Encounter for IUD insertion 02/04/2012  . Anxiety 03/26/2012  . Reactive depression 03/26/2012  . Patellofemoral syndrome of left knee 09/16/2012  . Right shoulder pain 07/28/2013  . Cubital tunnel syndrome on right 07/28/2013  . Family hx-breast malignancy 04/06/2014  . Breast tenderness in female 04/06/2014  . Fullness of breast 04/06/2014  . Radiculitis of right cervical region 03/22/2015  . Currently attempting to quit smoking 03/30/2015  . Generalized abdominal pain 03/30/2015  . Capsulitis of metatarsophalangeal (MTP) joint of left foot 02/01/2016  . Onychomycosis 02/17/2016  . Stress at home 04/19/2016  . Irritable bowel syndrome with both constipation and diarrhea 05/25/2016  . GAD (generalized anxiety disorder) 12/17/2016  . Inattention 12/17/2016  . History of attention deficit hyperactivity disorder (ADHD) 12/18/2016  . ADD (attention deficit disorder) 02/20/2017  . Obsessive behaviors 02/20/2017  . Obsessive compulsive disorder 02/22/2017  . Recurrent sinusitis 04/24/2017  . Right sided facial pain 04/24/2017  . Mastoid pain, right 04/24/2017  . Chronic maxillary sinusitis 04/24/2017  . Tachycardia 04/25/2017   Resolved Ambulatory Problems    Diagnosis Date Noted  . PHARYNGITIS, STREPTOCOCCAL 01/03/2010  . Acute maxillary sinusitis 04/02/2009  . NAUSEA 02/09/2008  . FLANK PAIN, LEFT 02/09/2008  . Acute sinusitis, unspecified 10/19/2010   . Ingrown left big toenail 07/28/2013  . Body aches 04/06/2014  . Viral pharyngitis 09/20/2014  . Constipation 03/30/2015  . Influenza A 06/06/2015  . Stress at work 04/19/2016   Past Medical History:  Diagnosis Date  . Depression   . Headache(784.0)   . History of recurrent UTIs   . Irritable bowel syndrome   . Kidney stones       Review of Systems  All other systems reviewed and are negative.      Objective:   Physical Exam  Constitutional: She is oriented to person, place, and time. She appears well-developed and well-nourished.  Cardiovascular: Regular rhythm.  Tachycardia 104  Pulmonary/Chest: Effort normal and breath sounds normal.  Neurological: She is alert and oriented to person, place, and time.  Psychiatric: She has a normal mood and affect. Her behavior is normal.          Assessment & Plan:  Marland KitchenMarland KitchenDiagnoses and all orders for this visit:  Attention deficit disorder (ADD) without hyperactivity -     lisdexamfetamine (VYVANSE) 40 MG capsule; Take 1 capsule (40 mg total) by mouth every morning. -     lisdexamfetamine (VYVANSE) 40 MG capsule; Take 1 capsule (40 mg total) by mouth every morning. -     lisdexamfetamine (VYVANSE) 40 MG capsule; Take 1 capsule (40 mg total) by mouth every morning. -     buPROPion (WELLBUTRIN XL) 300 MG 24 hr tablet; Take 1 tablet (300 mg total) by mouth daily.  Need for immunization against influenza -     Flu Vaccine QUAD 36+ mos IM  Anxiety -     buPROPion (WELLBUTRIN XL) 300 MG 24 hr tablet; Take 1 tablet (300 mg total) by mouth daily.  Reactive depression -     buPROPion (WELLBUTRIN XL) 300 MG 24 hr tablet; Take 1 tablet (300 mg total) by mouth daily.  Weight loss   .Marland Kitchen Depression screen [facility] 2/9 11/18/2017 06/03/2017 04/12/2017 03/29/2017 12/17/2016  Decreased Interest 0 1 1 2 1   Down, Depressed, Hopeless 0 1 1 2 1   PHQ - 2 Score 0 2 2 4 2   Altered sleeping 0 1 2 3  -  Tired, decreased energy 0 2 2 3  -  Change in  appetite 0 1 1 0 -  Feeling bad or failure about yourself  0 0 2 2 -  Trouble concentrating 0 0 0 2 -  Moving slowly or fidgety/restless 0 0 0 0 -  Suicidal thoughts 0 0 0 0 -  PHQ-9 Score 0 6 9 14  -  Difficult doing work/chores Not difficult at all Somewhat difficult - - -   .Marland Kitchen GAD 7 : Generalized Anxiety Score 11/18/2017 06/03/2017 04/12/2017 03/29/2017  Nervous, Anxious, on Edge 0 1 1 1   Control/stop worrying 0 0 1 1  Worry too much - different things 0 1 1 -  Trouble relaxing 0 1 1 2   Restless 0 0 0 0  Easily annoyed or irritable 0 0 2 2  Afraid - awful might happen 0 0 0 0  Total GAD 7 Score 0 3 6 -  Anxiety Difficulty Not difficult at all Somewhat difficult - -    Refilled wellbutrin.   Refilled vyvanse. Pt continues to lose weight. Discussed if drops under weight will have to decrease dose and/or come off of vyvanse. Make sure she is eating. Pt is watching heart race on apple watch only occasionally above 100. She rushed in here today.   Follow up in 3 months.

## 2017-11-19 ENCOUNTER — Encounter: Payer: Self-pay | Admitting: Physician Assistant

## 2017-11-22 ENCOUNTER — Encounter: Payer: Self-pay | Admitting: Physician Assistant

## 2017-12-03 ENCOUNTER — Encounter: Payer: Self-pay | Admitting: Physician Assistant

## 2017-12-04 NOTE — Telephone Encounter (Signed)
Hey,   If she does not have 1st degree relatives will insurance cover genetic myriad testing? Do you know?

## 2017-12-10 ENCOUNTER — Ambulatory Visit (INDEPENDENT_AMBULATORY_CARE_PROVIDER_SITE_OTHER): Payer: BLUE CROSS/BLUE SHIELD | Admitting: Sports Medicine

## 2017-12-10 ENCOUNTER — Encounter: Payer: Self-pay | Admitting: Sports Medicine

## 2017-12-10 DIAGNOSIS — M5030 Other cervical disc degeneration, unspecified cervical region: Secondary | ICD-10-CM | POA: Diagnosis not present

## 2017-12-10 MED ORDER — PREDNISONE 50 MG PO TABS: ORAL_TABLET | ORAL | 0 refills | Status: DC

## 2017-12-10 NOTE — Progress Notes (Signed)
Subjective:    CC: Left shoulder pain  HPI: Casey Sweeney is a pleasant 37 year old female, I treated her in the past for left shoulder pain, cervical radiculitis, more recently she is had a worsening of pain radiating from her neck, to the trapezius, left periscapular region and down the back of the left arm and forearm.  Not quite to the fingertips, no progressive weakness.  She did have a cervical epidural on the right a couple of years ago that provided good relief.  Pain is better with abduction of the shoulders.  Symptoms are moderate, persistent.  I reviewed the past medical history, family history, social history, surgical history, and allergies today and no changes were needed.  Please see the problem list section below in epic for further details.  Past Medical History: Past Medical History:  Diagnosis Date  . Anxiety   . Depression   . Headache(784.0)    migraines  . History of recurrent UTIs   . Irritable bowel syndrome   . Kidney stones    Past Surgical History: Past Surgical History:  Procedure Laterality Date  . CHOLECYSTECTOMY  10/17/12   lap chole  . CHOLECYSTECTOMY N/A 10/17/2012   Procedure: LAPAROSCOPIC CHOLECYSTECTOMY;  Surgeon: Axel Filler, MD;  Location: [facility] OR;  Service: General;  Laterality: N/A;  . NO PAST SURGERIES    . SHOULDER SURGERY Right    Social History: Social History   Socioeconomic History  . Marital status: Married    Spouse name: Not on file  . Number of children: Not on file  . Years of education: Not on file  . Highest education level: Not on file  Occupational History  . Occupation: unemployed  Social Needs  . Financial resource strain: Not on file  . Food insecurity:    Worry: Not on file    Inability: Not on file  . Transportation needs:    Medical: Not on file    Non-medical: Not on file  Tobacco Use  . Smoking status: Current Every Day Smoker    Packs/day: 1.00    Years: 10.00    Pack years: 10.00    Types: Cigarettes   Last attempt to quit: 04/12/2016    Years since quitting: 1.6  . Smokeless tobacco: Never Used  Substance and Sexual Activity  . Alcohol use: No  . Drug use: No  . Sexual activity: Yes    Partners: Male    Birth control/protection: IUD  Lifestyle  . Physical activity:    Days per week: Not on file    Minutes per session: Not on file  . Stress: Not on file  Relationships  . Social connections:    Talks on phone: Not on file    Gets together: Not on file    Attends religious service: Not on file    Active member of club or organization: Not on file    Attends meetings of clubs or organizations: Not on file    Relationship status: Not on file  Other Topics Concern  . Not on file  Social History Narrative  . Not on file   Family History: Family History  Problem Relation Age of Onset  . Cancer Other 80       unknown cancer  . Hypertension Mother   . Cancer Father        lung cancer  . Cancer Paternal Aunt        breast  . Cancer Maternal Grandfather        unknown  Allergies: Allergies  Allergen Reactions  . Lexapro [Escitalopram Oxalate]     Worsening depression.   Wilhemena Durie [Atomoxetine Hcl]     Heart burn   Medications: See med rec.  Review of Systems: No fevers, chills, night sweats, weight loss, chest pain, or shortness of breath.   Objective:    General: Well Developed, well nourished, and in no acute distress.  Neuro: Alert and oriented x3, extra-ocular muscles intact, sensation grossly intact.  HEENT: Normocephalic, atraumatic, pupils equal round reactive to light, neck supple, no masses, no lymphadenopathy, thyroid nonpalpable.  Skin: Warm and dry, no rashes. Cardiac: Regular rate and rhythm, no murmurs rubs or gallops, no lower extremity edema.  Respiratory: Clear to auscultation bilaterally. Not using accessory muscles, speaking in full sentences. Left shoulder: Inspection reveals no abnormalities, atrophy or asymmetry. Palpation is normal with no  tenderness over AC joint or bicipital groove. ROM is full in all planes. Rotator cuff strength normal throughout. No signs of impingement with negative Neer and Hawkin's tests, empty can. Speeds and Yergason's tests normal. No labral pathology noted with negative Obrien's, negative crank, negative clunk, and good stability. Normal scapular function observed. No painful arc and no drop arm sign. No apprehension sign Neck: Negative spurling's Full neck range of motion Grip strength and sensation normal in bilateral hands Strength good C4 to T1 distribution No sensory change to C4 to T1 Reflexes normal Tenderness over the trapezius and rhomboids.  MRI reviewed from 2017, there is a large C4-C5 disc protrusion with indentation of the anterior thecal sac without evidence of myelomalacia.  Impression and Recommendations:    DDD (degenerative disc disease), cervical Multilevel cervical spondylosis worse at C4-C5 with indentation of the anterior thecal sac. Left worse than right radicular pain. Getting an updated MRI for interventional planning.   5 days of prednisone. Occasional paresthesias in both hands. Rehab exercises given, if no better in a month we will proceed with cervical epidural.  ___________________________________________ Ihor Austin. Benjamin Stain, M.D., ABFM., CAQSM. Primary Care and Sports Medicine [facility]  Adjunct Instructor of Family Medicine  [employer]

## 2017-12-10 NOTE — Assessment & Plan Note (Signed)
Multilevel cervical spondylosis worse at C4-C5 with indentation of the anterior thecal sac. Left worse than right radicular pain. Getting an updated MRI for interventional planning.   5 days of prednisone. Occasional paresthesias in both hands. Rehab exercises given, if no better in a month we will proceed with cervical epidural.

## 2017-12-19 ENCOUNTER — Encounter: Payer: Self-pay | Admitting: Sports Medicine

## 2017-12-20 MED ORDER — PREDNISONE 10 MG (48) PO TBPK: ORAL_TABLET | Freq: Every day | ORAL | 0 refills | Status: DC

## 2017-12-24 ENCOUNTER — Encounter: Payer: Self-pay | Admitting: Sports Medicine

## 2018-01-13 ENCOUNTER — Other Ambulatory Visit: Payer: BLUE CROSS/BLUE SHIELD

## 2018-01-20 ENCOUNTER — Ambulatory Visit (INDEPENDENT_AMBULATORY_CARE_PROVIDER_SITE_OTHER): Payer: BLUE CROSS/BLUE SHIELD

## 2018-01-20 DIAGNOSIS — M5011 Cervical disc disorder with radiculopathy,  high cervical region: Secondary | ICD-10-CM | POA: Diagnosis not present

## 2018-01-20 DIAGNOSIS — M5030 Other cervical disc degeneration, unspecified cervical region: Secondary | ICD-10-CM | POA: Diagnosis not present

## 2018-01-20 DIAGNOSIS — M542 Cervicalgia: Secondary | ICD-10-CM | POA: Diagnosis not present

## 2018-01-30 ENCOUNTER — Ambulatory Visit: Payer: BLUE CROSS/BLUE SHIELD | Admitting: Sports Medicine

## 2018-03-19 ENCOUNTER — Encounter: Payer: Self-pay | Admitting: Physician Assistant

## 2018-03-21 ENCOUNTER — Encounter: Payer: Self-pay | Admitting: Physician Assistant

## 2018-03-21 ENCOUNTER — Ambulatory Visit (INDEPENDENT_AMBULATORY_CARE_PROVIDER_SITE_OTHER): Payer: BLUE CROSS/BLUE SHIELD | Admitting: Physician Assistant

## 2018-03-21 VITALS — BP 139/91 | HR 102 | Ht 68.0 in | Wt 132.0 lb

## 2018-03-21 DIAGNOSIS — R1012 Left upper quadrant pain: Secondary | ICD-10-CM

## 2018-03-21 DIAGNOSIS — F988 Other specified behavioral and emotional disorders with onset usually occurring in childhood and adolescence: Secondary | ICD-10-CM | POA: Diagnosis not present

## 2018-03-21 MED ORDER — LISDEXAMFETAMINE DIMESYLATE 40 MG PO CAPS: 40.0000 mg | ORAL_CAPSULE | ORAL | 0 refills | Status: DC

## 2018-03-21 MED ORDER — DICYCLOMINE HCL 10 MG PO CAPS: 10.0000 mg | ORAL_CAPSULE | Freq: Three times a day (TID) | ORAL | 0 refills | Status: DC

## 2018-03-21 NOTE — Progress Notes (Signed)
Subjective:    Patient ID: Casey Sweeney, female    DOB: [birth year], 38 y.o.   MRN: [id number]  HPI  Pt is a 38 yo female who presents to the clinic for 3 month ADHD follow up and to discuss abdominal pain intermittent for the last week. Zantac seemed to help a lot. Every since her gallbladder surgery her bowel movements have gone from diarrhea to contstipation without warning. She denies any worsening reflux symptoms. No new medications. She does drink some alcohol. She has been eating more around the holidays. Denies andy melena or hematochezia.   Marland Kitchen. Active Ambulatory Problems    Diagnosis Date Noted  . Encounter for IUD insertion 02/04/2012  . Anxiety 03/26/2012  . Reactive depression 03/26/2012  . Patellofemoral syndrome of left knee 09/16/2012  . Right shoulder pain 07/28/2013  . Cubital tunnel syndrome on right 07/28/2013  . Family hx-breast malignancy 04/06/2014  . Breast tenderness in female 04/06/2014  . Fullness of breast 04/06/2014  . DDD (degenerative disc disease), cervical 03/22/2015  . Currently attempting to quit smoking 03/30/2015  . Generalized abdominal pain 03/30/2015  . Capsulitis of metatarsophalangeal (MTP) joint of left foot 02/01/2016  . Onychomycosis 02/17/2016  . Stress at home 04/19/2016  . Irritable bowel syndrome with both constipation and diarrhea 05/25/2016  . GAD (generalized anxiety disorder) 12/17/2016  . Inattention 12/17/2016  . History of attention deficit hyperactivity disorder (ADHD) 12/18/2016  . ADD (attention deficit disorder) 02/20/2017  . Obsessive behaviors 02/20/2017  . Obsessive compulsive disorder 02/22/2017  . Recurrent sinusitis 04/24/2017  . Right sided facial pain 04/24/2017  . Mastoid pain, right 04/24/2017  . Chronic maxillary sinusitis 04/24/2017  . Tachycardia 04/25/2017  . Weight loss 11/18/2017   Resolved Ambulatory Problems    Diagnosis Date Noted  . PHARYNGITIS, STREPTOCOCCAL 01/03/2010  . Acute maxillary  sinusitis 04/02/2009  . NAUSEA 02/09/2008  . FLANK PAIN, LEFT 02/09/2008  . Acute sinusitis, unspecified 10/19/2010  . Ingrown left big toenail 07/28/2013  . Body aches 04/06/2014  . Viral pharyngitis 09/20/2014  . Constipation 03/30/2015  . Influenza A 06/06/2015  . Stress at work 04/19/2016   Past Medical History:  Diagnosis Date  . Depression   . Headache(784.0)   . History of recurrent UTIs   . Irritable bowel syndrome   . Kidney stones       Review of Systems See HPI.     Objective:   Physical Exam Vitals signs reviewed.  Constitutional:      Appearance: Normal appearance.  HENT:     Head: Normocephalic and atraumatic.  Cardiovascular:     Rate and Rhythm: Regular rhythm.     Pulses: Normal pulses.     Heart sounds: Normal heart sounds.  Pulmonary:     Effort: Pulmonary effort is normal.     Breath sounds: Normal breath sounds.  Abdominal:     General: Bowel sounds are normal.     Palpations: Abdomen is soft.     Comments: Some epigastric discomfort.  Neurological:     General: No focal deficit present.     Mental Status: She is alert and oriented to person, place, and time.  Psychiatric:        Mood and Affect: Mood normal.        Behavior: Behavior normal.           Assessment & Plan:  Marland KitchenMarland KitchenMakynze was seen today for follow-up.  Diagnoses and all orders for this visit:  Left upper quadrant  pain -     dicyclomine (BENTYL) 10 MG capsule; Take 1 capsule (10 mg total) by mouth 4 (four) times daily -  before meals and at bedtime.  Attention deficit disorder (ADD) without hyperactivity -     lisdexamfetamine (VYVANSE) 40 MG capsule; Take 1 capsule (40 mg total) by mouth every morning. -     lisdexamfetamine (VYVANSE) 40 MG capsule; Take 1 capsule (40 mg total) by mouth every morning. -     lisdexamfetamine (VYVANSE) 40 MG capsule; Take 1 capsule (40 mg total) by mouth every morning.   No red flag on exam. zanatc seemed to help. Likely some gastritis.  Watch diet and keep more bland. Continue zantac or due to recalls switch to pepcid for the next week. If notice any stools changes or abdominal pain worsens we can get labs and do more of a work up.   vyvanse refilled for 3 months.

## 2018-03-21 NOTE — Patient Instructions (Signed)
Gastritis, Adult    Gastritis is swelling (inflammation) of the stomach. Gastritis can develop quickly (acute). It can also develop slowly over time (chronic). It is important to get help for this condition. If you do not get help, your stomach can bleed, and you can get sores (ulcers) in your stomach.  What are the causes?  This condition may be caused by:   Germs that get to your stomach.   Drinking too much alcohol.   Medicines you are taking.   Too much acid in the stomach.   A disease of the intestines or stomach.   Stress.   An allergic reaction.   Crohn's disease.   Some cancer treatments (radiation).  Sometimes the cause of this condition is not known.  What are the signs or symptoms?  Symptoms of this condition include:   Pain in your stomach.   A burning feeling in your stomach.   Feeling sick to your stomach (nauseous).   Throwing up (vomiting).   Feeling too full after you eat.   Weight loss.   Bad breath.   Throwing up blood.   Blood in your poop (stool).  How is this diagnosed?  This condition may be diagnosed with:   Your medical history and symptoms.   A physical exam.   Tests. These can include:  ? Blood tests.  ? Stool tests.  ? A procedure to look inside your stomach (upper endoscopy).  ? A test in which a sample of tissue is taken for testing (biopsy).  How is this treated?  Treatment for this condition depends on what caused it. You may be given:   Antibiotic medicine, if your condition was caused by germs.   H2 blockers and similar medicines, if your condition was caused by too much acid.  Follow these instructions at home:  Medicines   Take over-the-counter and prescription medicines only as told by your doctor.   If you were prescribed an antibiotic medicine, take it as told by your doctor. Do not stop taking it even if you start to feel better.  Eating and drinking     Eat small meals often, instead of large meals.   Avoid foods and drinks that make your symptoms  worse.   Drink enough fluid to keep your pee (urine) pale yellow.  Alcohol use   Do not drink alcohol if:  ? Your doctor tells you not to drink.  ? You are pregnant, may be pregnant, or are planning to become pregnant.   If you drink alcohol:  ? Limit your use to:   0-1 drink a day for women.   0-2 drinks a day for men.  ? Be aware of how much alcohol is in your drink. In the [place] one drink equals one 12 oz bottle of beer (355 mL), one 5 oz glass of wine (148 mL), or one 1 oz glass of hard liquor (44 mL).  General instructions   Talk with your doctor about ways to manage stress. You can exercise or do deep breathing, meditation, or yoga.   Do not smoke or use products that have nicotine or tobacco. If you need help quitting, ask your doctor.   Keep all follow-up visits as told by your doctor. This is important.  Contact a doctor if:   Your symptoms get worse.   Your symptoms go away and then come back.  Get help right away if:   You throw up blood or something that looks like coffee   grounds.   You have black or dark red poop.   You throw up any time you try to drink fluids.   Your stomach pain gets worse.   You have a fever.   You do not feel better after one week.  Summary   Gastritis is swelling (inflammation) of the stomach.   You must get help for this condition. If you do not get help, your stomach can bleed, and you can get sores (ulcers).   This condition is diagnosed with medical history, physical exam, or tests.   You can be treated with medicines for germs or medicines to block too much acid in your stomach.  This information is not intended to replace advice given to you by your health care provider. Make sure you discuss any questions you have with your health care provider.  Document Released: 08/15/2007 Document Revised: 07/16/2017 Document Reviewed: 07/16/2017  Elsevier Interactive Patient Education  2019 [employer]

## 2018-03-23 ENCOUNTER — Encounter: Payer: Self-pay | Admitting: Physician Assistant

## 2018-05-05 ENCOUNTER — Encounter: Payer: Self-pay | Admitting: Sports Medicine

## 2018-05-07 ENCOUNTER — Ambulatory Visit (INDEPENDENT_AMBULATORY_CARE_PROVIDER_SITE_OTHER): Payer: BLUE CROSS/BLUE SHIELD | Admitting: Physician Assistant

## 2018-05-07 ENCOUNTER — Encounter: Payer: Self-pay | Admitting: Physician Assistant

## 2018-05-07 VITALS — BP 128/88 | HR 108 | Temp 98.0°F | Wt 130.3 lb

## 2018-05-07 DIAGNOSIS — M25521 Pain in right elbow: Secondary | ICD-10-CM | POA: Insufficient documentation

## 2018-05-07 DIAGNOSIS — M25532 Pain in left wrist: Secondary | ICD-10-CM | POA: Insufficient documentation

## 2018-05-07 DIAGNOSIS — R300 Dysuria: Secondary | ICD-10-CM | POA: Diagnosis not present

## 2018-05-07 DIAGNOSIS — N3001 Acute cystitis with hematuria: Secondary | ICD-10-CM

## 2018-05-07 DIAGNOSIS — M25542 Pain in joints of left hand: Secondary | ICD-10-CM | POA: Diagnosis not present

## 2018-05-07 LAB — POCT URINALYSIS DIPSTICK
Bilirubin, UA: NEGATIVE
Glucose, UA: NEGATIVE
Ketones, UA: NEGATIVE
NITRITE UA: NEGATIVE
PROTEIN UA: NEGATIVE
SPEC GRAV UA: 1.01 (ref 1.010–1.025)
Urobilinogen, UA: 0.2 E.U./dL
pH, UA: 6 (ref 5.0–8.0)

## 2018-05-07 MED ORDER — NITROFURANTOIN MONOHYD MACRO 100 MG PO CAPS: 100.0000 mg | ORAL_CAPSULE | Freq: Two times a day (BID) | ORAL | 0 refills | Status: DC

## 2018-05-07 NOTE — Patient Instructions (Signed)
Urinary Tract Infection, Adult A urinary tract infection (UTI) is an infection of any part of the urinary tract. The urinary tract includes:  The kidneys.  The ureters.  The bladder.  The urethra. These organs make, store, and get rid of pee (urine) in the body. What are the causes? This is caused by germs (bacteria) in your genital area. These germs grow and cause swelling (inflammation) of your urinary tract. What increases the risk? You are more likely to develop this condition if:  You have a small, thin tube (catheter) to drain pee.  You cannot control when you pee or poop (incontinence).  You are female, and: ? You use these methods to prevent pregnancy: ? A medicine that kills sperm (spermicide). ? A device that blocks sperm (diaphragm). ? You have low levels of a female hormone (estrogen). ? You are pregnant.  You have genes that add to your risk.  You are sexually active.  You take antibiotic medicines.  You have trouble peeing because of: ? A prostate that is bigger than normal, if you are female. ? A blockage in the part of your body that drains pee from the bladder (urethra). ? A kidney stone. ? A nerve condition that affects your bladder (neurogenic bladder). ? Not getting enough to drink. ? Not peeing often enough.  You have other conditions, such as: ? Diabetes. ? A weak disease-fighting system (immune system). ? Sickle cell disease. ? Gout. ? Injury of the spine. What are the signs or symptoms? Symptoms of this condition include:  Needing to pee right away (urgently).  Peeing often.  Peeing small amounts often.  Pain or burning when peeing.  Blood in the pee.  Pee that smells bad or not like normal.  Trouble peeing.  Pee that is cloudy.  Fluid coming from the vagina, if you are female.  Pain in the belly or lower back. Other symptoms include:  Throwing up (vomiting).  No urge to eat.  Feeling mixed up (confused).  Being tired  and grouchy (irritable).  A fever.  Watery poop (diarrhea). How is this treated? This condition may be treated with:  Antibiotic medicine.  Other medicines.  Drinking enough water. Follow these instructions at home:  Medicines  Take over-the-counter and prescription medicines only as told by your doctor.  If you were prescribed an antibiotic medicine, take it as told by your doctor. Do not stop taking it even if you start to feel better. General instructions  Make sure you: ? Pee until your bladder is empty. ? Do not hold pee for a long time. ? Empty your bladder after sex. ? Wipe from front to back after pooping if you are a female. Use each tissue one time when you wipe.  Drink enough fluid to keep your pee pale yellow.  Keep all follow-up visits as told by your doctor. This is important. Contact a doctor if:  You do not get better after 1-2 days.  Your symptoms go away and then come back. Get help right away if:  You have very bad back pain.  You have very bad pain in your lower belly.  You have a fever.  You are sick to your stomach (nauseous).  You are throwing up. Summary  A urinary tract infection (UTI) is an infection of any part of the urinary tract.  This condition is caused by germs in your genital area.  There are many risk factors for a UTI. These include having a small, thin   tube to drain pee and not being able to control when you pee or poop.  Treatment includes antibiotic medicines for germs.  Drink enough fluid to keep your pee pale yellow. This information is not intended to replace advice given to you by your health care provider. Make sure you discuss any questions you have with your health care provider. Document Released: 08/15/2007 Document Revised: 09/05/2017 Document Reviewed: 09/05/2017 Elsevier Interactive Patient Education  2019 [employer]  

## 2018-05-07 NOTE — Progress Notes (Signed)
Subjective:    Patient ID: Casey Sweeney, female    DOB: Oct 12, 1980, 38 y.o.   MRN: [id number]  HPI  Casey Sweeney presents with urinary symptoms that began on Sunday. She endorses dysuria, increase in frequency, malodorous and cloudy urine. She denies hematuria, nausea, vomiting, fever, back pain or abdominal pain. She has been staying hydrated and drinking cran-grape juice to help combat her symptoms. She has had UTIs in the past with the last being 6 months ago. She says they feel very similar.  Patient is also having right elbow pain and left index finger pain. It has been intermittent over the last two weeks. She denies any trauma or injury to the joints. She denies any swelling, redness, or bruising. She has not taken anything to help with the pain. She has been playing more video games.   .. Active Ambulatory Problems    Diagnosis Date Noted  . Encounter for IUD insertion 02/04/2012  . Anxiety 03/26/2012  . Reactive depression 03/26/2012  . Patellofemoral syndrome of left knee 09/16/2012  . Right shoulder pain 07/28/2013  . Cubital tunnel syndrome on right 07/28/2013  . Family hx-breast malignancy 04/06/2014  . Breast tenderness in female 04/06/2014  . Fullness of breast 04/06/2014  . DDD (degenerative disc disease), cervical 03/22/2015  . Currently attempting to quit smoking 03/30/2015  . Generalized abdominal pain 03/30/2015  . Capsulitis of metatarsophalangeal (MTP) joint of left foot 02/01/2016  . Onychomycosis 02/17/2016  . Stress at home 04/19/2016  . Irritable bowel syndrome with both constipation and diarrhea 05/25/2016  . GAD (generalized anxiety disorder) 12/17/2016  . Inattention 12/17/2016  . History of attention deficit hyperactivity disorder (ADHD) 12/18/2016  . ADD (attention deficit disorder) 02/20/2017  . Obsessive behaviors 02/20/2017  . Obsessive compulsive disorder 02/22/2017  . Recurrent sinusitis 04/24/2017  . Right sided facial pain 04/24/2017  .  Mastoid pain, right 04/24/2017  . Chronic maxillary sinusitis 04/24/2017  . Tachycardia 04/25/2017  . Weight loss 11/18/2017  . Arthralgia of left hand 05/07/2018  . Right elbow pain 05/07/2018   Resolved Ambulatory Problems    Diagnosis Date Noted  . PHARYNGITIS, STREPTOCOCCAL 01/03/2010  . Acute maxillary sinusitis 04/02/2009  . NAUSEA 02/09/2008  . FLANK PAIN, LEFT 02/09/2008  . Acute sinusitis, unspecified 10/19/2010  . Ingrown left big toenail 07/28/2013  . Body aches 04/06/2014  . Viral pharyngitis 09/20/2014  . Constipation 03/30/2015  . Influenza A 06/06/2015  . Stress at work 04/19/2016   Past Medical History:  Diagnosis Date  . Depression   . Headache(784.0)   . History of recurrent UTIs   . Irritable bowel syndrome   . Kidney stones      Review of Systems See HPI    Objective:   Physical Exam Constitutional:      Appearance: Normal appearance.  HENT:     Head: Normocephalic.     Nose: Nose normal.     Mouth/Throat:     Pharynx: Oropharynx is clear.  Eyes:     Conjunctiva/sclera: Conjunctivae normal.     Pupils: Pupils are equal, round, and reactive to light.  Cardiovascular:     Rate and Rhythm: Normal rate and regular rhythm.  Pulmonary:     Breath sounds: Normal breath sounds.  Abdominal:     Tenderness: There is no abdominal tenderness. There is no right CVA tenderness or left CVA tenderness.  Musculoskeletal: Normal range of motion.        General: No swelling, tenderness, deformity or signs  of injury.  Skin:    General: Skin is warm.     Capillary Refill: Capillary refill takes less than 2 seconds.  Neurological:     General: No focal deficit present.     Mental Status: She is alert and oriented to person, place, and time.  Psychiatric:        Mood and Affect: Mood normal.        Behavior: Behavior normal.           Assessment & Plan:  Marland KitchenMarland KitchenMadellyn was seen today for dysuria.  Diagnoses and all orders for this visit:  Acute  cystitis with hematuria -     nitrofurantoin, macrocrystal-monohydrate, (MACROBID) 100 MG capsule; Take 1 capsule (100 mg total) by mouth 2 (two) times daily. -     Urine Culture  Dysuria -     POCT Urinalysis Dipstick  Right elbow pain  Arthralgia of left hand   .Marland Kitchen Results for orders placed or performed in visit on 05/07/18  POCT Urinalysis Dipstick  Result Value Ref Range   Color, UA yellow    Clarity, UA clear    Glucose, UA Negative Negative   Bilirubin, UA NEG    Ketones, UA Neg    Spec Grav, UA 1.010 1.010 - 1.025   Blood, UA trace    pH, UA 6.0 5.0 - 8.0   Protein, UA Negative Negative   Urobilinogen, UA 0.2 0.2 or 1.0 E.U./dL   Nitrite, UA neg    Leukocytes, UA Small (1+) (A) Negative   Appearance     Odor strong    Blood and leukocytes positive will treat with macrobid. HO given with symptomatic care. Rest and hydrate. Will culture.   Sample of pennsaid given for elbow tendonitis at insertion of triceps and PIP index finger joint pain. Likely tendonitis from overuse and joint pain from arthritis. No swelling or redness noted. Pt declined xray today. Continue to use ice and oral NSAIDs as needed. Follow up as needed.   Marland KitchenHarlon Flor PA-C, have reviewed and agree with the above documentation in it's entirety.

## 2018-05-08 ENCOUNTER — Encounter: Payer: Self-pay | Admitting: Physician Assistant

## 2018-05-09 LAB — URINE CULTURE
MICRO NUMBER: 246242
SPECIMEN QUALITY:: ADEQUATE

## 2018-05-09 NOTE — Progress Notes (Signed)
Confirmed UTI with e.coli.

## 2018-05-15 ENCOUNTER — Encounter: Payer: Self-pay | Admitting: Physician Assistant

## 2018-05-15 DIAGNOSIS — M25542 Pain in joints of left hand: Secondary | ICD-10-CM

## 2018-05-16 ENCOUNTER — Ambulatory Visit (INDEPENDENT_AMBULATORY_CARE_PROVIDER_SITE_OTHER): Payer: BLUE CROSS/BLUE SHIELD

## 2018-05-16 DIAGNOSIS — M25542 Pain in joints of left hand: Secondary | ICD-10-CM

## 2018-05-16 DIAGNOSIS — M79645 Pain in left finger(s): Secondary | ICD-10-CM | POA: Diagnosis not present

## 2018-06-03 ENCOUNTER — Other Ambulatory Visit: Payer: Self-pay | Admitting: Physician Assistant

## 2018-06-03 ENCOUNTER — Encounter: Payer: Self-pay | Admitting: Physician Assistant

## 2018-06-03 DIAGNOSIS — F419 Anxiety disorder, unspecified: Secondary | ICD-10-CM

## 2018-06-03 DIAGNOSIS — F988 Other specified behavioral and emotional disorders with onset usually occurring in childhood and adolescence: Secondary | ICD-10-CM

## 2018-06-03 DIAGNOSIS — F329 Major depressive disorder, single episode, unspecified: Secondary | ICD-10-CM

## 2018-06-04 ENCOUNTER — Other Ambulatory Visit: Payer: Self-pay

## 2018-06-04 ENCOUNTER — Encounter: Payer: Self-pay | Admitting: Sports Medicine

## 2018-06-04 ENCOUNTER — Encounter: Payer: Self-pay | Admitting: Physician Assistant

## 2018-06-04 ENCOUNTER — Ambulatory Visit (INDEPENDENT_AMBULATORY_CARE_PROVIDER_SITE_OTHER): Payer: BLUE CROSS/BLUE SHIELD | Admitting: Physician Assistant

## 2018-06-04 VITALS — BP 127/88 | HR 90

## 2018-06-04 DIAGNOSIS — M79645 Pain in left finger(s): Secondary | ICD-10-CM | POA: Diagnosis not present

## 2018-06-04 MED ORDER — DICLOFENAC SODIUM 75 MG PO TBEC: 75.0000 mg | DELAYED_RELEASE_TABLET | Freq: Two times a day (BID) | ORAL | 2 refills | Status: DC

## 2018-06-04 NOTE — Telephone Encounter (Signed)
Patient scheduled.

## 2018-06-04 NOTE — Progress Notes (Signed)
Subjective:    Patient ID: Casey Sweeney, female    DOB: Apr 09, 1980, 38 y.o.   MRN: [id number]  HPI  Pt is a 38 yo female who calls into the clinic via webex to follow up on left index finger pain. She mentioned this pain at her 05/16/18 appt. Xray was done that was essentially negative. She is having more and more intermittent pain with PIP joint of left index finger. She wakes up in the morning with stiff hot joints that seems to be some better through out the day. Denies any swelling or warm but finger "feels hot inside". No know injury. No catching of finger. Ibuprofen is not helping much with symptoms. Ice makes it feel better but relief is temporary.   .. Active Ambulatory Problems    Diagnosis Date Noted  . Encounter for IUD insertion 02/04/2012  . Anxiety 03/26/2012  . Reactive depression 03/26/2012  . Patellofemoral syndrome of left knee 09/16/2012  . Right shoulder pain 07/28/2013  . Cubital tunnel syndrome on right 07/28/2013  . Family hx-breast malignancy 04/06/2014  . Breast tenderness in female 04/06/2014  . Fullness of breast 04/06/2014  . DDD (degenerative disc disease), cervical 03/22/2015  . Currently attempting to quit smoking 03/30/2015  . Generalized abdominal pain 03/30/2015  . Capsulitis of metatarsophalangeal (MTP) joint of left foot 02/01/2016  . Onychomycosis 02/17/2016  . Stress at home 04/19/2016  . Irritable bowel syndrome with both constipation and diarrhea 05/25/2016  . GAD (generalized anxiety disorder) 12/17/2016  . Inattention 12/17/2016  . History of attention deficit hyperactivity disorder (ADHD) 12/18/2016  . ADD (attention deficit disorder) 02/20/2017  . Obsessive behaviors 02/20/2017  . Obsessive compulsive disorder 02/22/2017  . Recurrent sinusitis 04/24/2017  . Right sided facial pain 04/24/2017  . Mastoid pain, right 04/24/2017  . Chronic maxillary sinusitis 04/24/2017  . Tachycardia 04/25/2017  . Weight loss 11/18/2017  . Arthralgia  of left hand 05/07/2018  . Right elbow pain 05/07/2018   Resolved Ambulatory Problems    Diagnosis Date Noted  . PHARYNGITIS, STREPTOCOCCAL 01/03/2010  . Acute maxillary sinusitis 04/02/2009  . NAUSEA 02/09/2008  . FLANK PAIN, LEFT 02/09/2008  . Acute sinusitis, unspecified 10/19/2010  . Ingrown left big toenail 07/28/2013  . Body aches 04/06/2014  . Viral pharyngitis 09/20/2014  . Constipation 03/30/2015  . Influenza A 06/06/2015  . Stress at work 04/19/2016   Past Medical History:  Diagnosis Date  . Depression   . Headache(784.0)   . History of recurrent UTIs   . Irritable bowel syndrome   . Kidney stones       Review of Systems See HPI.     Objective:   Physical Exam .. Today's Vitals   06/04/18 1248  BP: 127/88  Pulse: 90   There is no height or weight on file to calculate BMI.  Left index finger does not appear bruised, red, swollen. NRoM of left index finger.        Assessment & Plan:  Marland KitchenMarland KitchenDiagnoses and all orders for this visit:  Pain in finger of left hand -     diclofenac (VOLTAREN) 75 MG EC tablet; Take 1 tablet (75 mg total) by mouth 2 (two) times daily. -     Cyclic citrul peptide antibody, IgG -     Rheumatoid factor -     Uric acid -     Antinuclear Antib (ANA) -     COMPLETE METABOLIC PANEL WITH GFR   With pain worse in the morning  over PIP joint of left index finger will work up for RA and gout. Less suspicious of gout due to no palpable warm and palpation. Xray of finger 05/16/18 was unremarkable. Labs ordered. Ice as needed. Start diclofenac.   To note webex conducted with some video problems throughout the visit.

## 2018-06-05 NOTE — Progress Notes (Signed)
A few labs are still pending but so fact neg RF factor and normal uric acid(no gout). Kidney, liver functioning great. Did you try the diclofenac? Does he help any more?

## 2018-06-05 NOTE — Progress Notes (Signed)
Pt has seen results on MyChart and message also sent for patient to call back if any questions.

## 2018-06-05 NOTE — Progress Notes (Signed)
Call pt: no signs of autoimmune diseases or RA or gout. You need to schedule an appt with Dr. Karie Schwalbe or Denyse Amass for them to do a physical exam and see what they think!

## 2018-06-06 ENCOUNTER — Encounter: Payer: Self-pay | Admitting: Physician Assistant

## 2018-06-06 LAB — COMPLETE METABOLIC PANEL WITH GFR
AG Ratio: 2.4 (calc) (ref 1.0–2.5)
ALKALINE PHOSPHATASE (APISO): 60 U/L (ref 31–125)
ALT: 13 U/L (ref 6–29)
AST: 15 U/L (ref 10–30)
Albumin: 4.5 g/dL (ref 3.6–5.1)
BUN: 12 mg/dL (ref 7–25)
CO2: 26 mmol/L (ref 20–32)
Calcium: 9.2 mg/dL (ref 8.6–10.2)
Chloride: 107 mmol/L (ref 98–110)
Creat: 0.92 mg/dL (ref 0.50–1.10)
GFR, Est African American: 93 mL/min/{1.73_m2} (ref >=60)
GFR, Est Non African American: 81 mL/min/{1.73_m2} (ref >=60)
Globulin: 1.9 g/dL (calc) (ref 1.9–3.7)
Glucose, Bld: 90 mg/dL (ref 65–99)
Potassium: 3.8 mmol/L (ref 3.5–5.3)
Sodium: 140 mmol/L (ref 135–146)
Total Bilirubin: 0.5 mg/dL (ref 0.2–1.2)
Total Protein: 6.4 g/dL (ref 6.1–8.1)

## 2018-06-06 LAB — ANA: Anti Nuclear Antibody (ANA): NEGATIVE

## 2018-06-06 LAB — URIC ACID: Uric Acid, Serum: 3.7 mg/dL (ref 2.5–7.0)

## 2018-06-06 LAB — RHEUMATOID FACTOR

## 2018-06-06 LAB — CYCLIC CITRUL PEPTIDE ANTIBODY, IGG: Cyclic Citrullin Peptide Ab: <16 UNITS

## 2018-06-09 MED ORDER — PREDNISONE 10 MG PO TABS: ORAL_TABLET | ORAL | 0 refills | Status: DC

## 2018-06-12 ENCOUNTER — Other Ambulatory Visit: Payer: Self-pay | Admitting: Physician Assistant

## 2018-06-12 ENCOUNTER — Encounter: Payer: Self-pay | Admitting: Physician Assistant

## 2018-06-12 DIAGNOSIS — M545 Low back pain, unspecified: Secondary | ICD-10-CM

## 2018-06-12 NOTE — Telephone Encounter (Signed)
Yes place order for culture.

## 2018-06-12 NOTE — Progress Notes (Signed)
Lab order placed per MyChart message

## 2018-06-12 NOTE — Telephone Encounter (Signed)
Lab ordered per PCP  

## 2018-07-18 ENCOUNTER — Other Ambulatory Visit: Payer: Self-pay | Admitting: Physician Assistant

## 2018-07-18 ENCOUNTER — Telehealth: Payer: Self-pay | Admitting: Physician Assistant

## 2018-07-18 DIAGNOSIS — F988 Other specified behavioral and emotional disorders with onset usually occurring in childhood and adolescence: Secondary | ICD-10-CM

## 2018-07-18 MED ORDER — LISDEXAMFETAMINE DIMESYLATE 40 MG PO CAPS: 40.0000 mg | ORAL_CAPSULE | ORAL | 0 refills | Status: DC

## 2018-07-21 NOTE — Telephone Encounter (Signed)
Left pt a Vm to schedule a F/u on med refills - virtually with Tandy Gaw.

## 2018-07-22 ENCOUNTER — Ambulatory Visit (INDEPENDENT_AMBULATORY_CARE_PROVIDER_SITE_OTHER): Payer: BLUE CROSS/BLUE SHIELD | Admitting: Physician Assistant

## 2018-07-22 VITALS — Temp 98.0°F | Ht 68.0 in | Wt 125.0 lb

## 2018-07-22 DIAGNOSIS — F988 Other specified behavioral and emotional disorders with onset usually occurring in childhood and adolescence: Secondary | ICD-10-CM | POA: Diagnosis not present

## 2018-07-22 DIAGNOSIS — F329 Major depressive disorder, single episode, unspecified: Secondary | ICD-10-CM | POA: Diagnosis not present

## 2018-07-22 DIAGNOSIS — F419 Anxiety disorder, unspecified: Secondary | ICD-10-CM

## 2018-07-22 MED ORDER — LISDEXAMFETAMINE DIMESYLATE 40 MG PO CAPS: 40.0000 mg | ORAL_CAPSULE | ORAL | 0 refills | Status: DC

## 2018-07-22 MED ORDER — BUPROPION HCL ER (XL) 300 MG PO TB24: 300.0000 mg | ORAL_TABLET | Freq: Every day | ORAL | 3 refills | Status: DC

## 2018-07-22 NOTE — Progress Notes (Deleted)
Patient doing well. No refills needed at this time. PHQ9(2)-GAD7 completed.

## 2018-07-22 NOTE — Progress Notes (Signed)
Patient ID: Casey Sweeney, female   DOB: 11-29-1980, 38 y.o.   MRN: [id number] .Marland KitchenVirtual Visit via Video Note  I connected with Dorna Mai on 07/22/18 at  3:40 PM EDT by a video enabled telemedicine application and verified that I am speaking with the correct person using two identifiers.  Location: Patient: home  Provider: clinic   I discussed the limitations of evaluation and management by telemedicine and the availability of in person appointments. The patient expressed understanding and agreed to proceed.  History of Present Illness: Pt is a 38 yo female with ADHD, anxiety, reactive depression who calls into clinic for 3 month refills.   She is doing well overall. She has moments where anxiety and depression could be problematic but she gets out of the house, exercises or goes and talks to someone. She got a part time job which makes her happy. She does overall feel much better because she can get things done around the house and feels focused. Sleeping well. No SI/HC. Denies any palpitations. Heart rate staying controlled.   .. Active Ambulatory Problems    Diagnosis Date Noted  . Encounter for IUD insertion 02/04/2012  . Anxiety 03/26/2012  . Reactive depression 03/26/2012  . Patellofemoral syndrome of left knee 09/16/2012  . Right shoulder pain 07/28/2013  . Cubital tunnel syndrome on right 07/28/2013  . Family hx-breast malignancy 04/06/2014  . Breast tenderness in female 04/06/2014  . Fullness of breast 04/06/2014  . DDD (degenerative disc disease), cervical 03/22/2015  . Currently attempting to quit smoking 03/30/2015  . Generalized abdominal pain 03/30/2015  . Capsulitis of metatarsophalangeal (MTP) joint of left foot 02/01/2016  . Onychomycosis 02/17/2016  . Stress at home 04/19/2016  . Irritable bowel syndrome with both constipation and diarrhea 05/25/2016  . GAD (generalized anxiety disorder) 12/17/2016  . Inattention 12/17/2016  . History of attention deficit  hyperactivity disorder (ADHD) 12/18/2016  . ADD (attention deficit disorder) 02/20/2017  . Obsessive behaviors 02/20/2017  . Obsessive compulsive disorder 02/22/2017  . Recurrent sinusitis 04/24/2017  . Right sided facial pain 04/24/2017  . Mastoid pain, right 04/24/2017  . Chronic maxillary sinusitis 04/24/2017  . Tachycardia 04/25/2017  . Weight loss 11/18/2017  . Arthralgia of left hand 05/07/2018  . Right elbow pain 05/07/2018   Resolved Ambulatory Problems    Diagnosis Date Noted  . PHARYNGITIS, STREPTOCOCCAL 01/03/2010  . Acute maxillary sinusitis 04/02/2009  . NAUSEA 02/09/2008  . FLANK PAIN, LEFT 02/09/2008  . Acute sinusitis, unspecified 10/19/2010  . Ingrown left big toenail 07/28/2013  . Body aches 04/06/2014  . Viral pharyngitis 09/20/2014  . Constipation 03/30/2015  . Influenza A 06/06/2015  . Stress at work 04/19/2016   Past Medical History:  Diagnosis Date  . Depression   . Headache(784.0)   . History of recurrent UTIs   . Irritable bowel syndrome   . Kidney stones    Reviewed med, allergy, problem list.     Observations/Objective: No acute distress.  Normal breathing.  Normal appearance.  Normal mood.   .. Today's Vitals   07/22/18 1343  Temp: 98 F (36.7 C)  TempSrc: Oral  Weight: 125 lb (56.7 kg)  Height: 5\' 8"  (1.727 m)   Body mass index is 19.01 kg/m.  .. Depression screen [facility] 2/9 07/22/2018 03/21/2018 11/18/2017 06/03/2017 04/12/2017  Decreased Interest 0 0 0 1 1  Down, Depressed, Hopeless 0 0 0 1 1  PHQ - 2 Score 0 0 0 2 2  Altered sleeping - -  0 1 2  Tired, decreased energy - - 0 2 2  Change in appetite - - 0 1 1  Feeling bad or failure about yourself  - - 0 0 2  Trouble concentrating - - 0 0 0  Moving slowly or fidgety/restless - - 0 0 0  Suicidal thoughts - - 0 0 0  PHQ-9 Score - - 0 6 9  Difficult doing work/chores - - Not difficult at all Somewhat difficult -   .. GAD 7 : Generalized Anxiety Score 07/22/2018 03/21/2018 11/18/2017  06/03/2017  Nervous, Anxious, on Edge 1 0 0 1  Control/stop worrying 0 0 0 0  Worry too much - different things 0 0 0 1  Trouble relaxing 1 0 0 1  Restless 0 0 0 0  Easily annoyed or irritable 1 0 0 0  Afraid - awful might happen 0 0 0 0  Total GAD 7 Score 3 0 0 3  Anxiety Difficulty Not difficult at all Not difficult at all Not difficult at all Somewhat difficult     Assessment and Plan: Marland Kitchen.Marland Kitchen.Diagnoses and all orders for this visit:  Attention deficit disorder (ADD) without hyperactivity -     lisdexamfetamine (VYVANSE) 40 MG capsule; Take 1 capsule (40 mg total) by mouth every morning. -     lisdexamfetamine (VYVANSE) 40 MG capsule; Take 1 capsule (40 mg total) by mouth every morning. -     lisdexamfetamine (VYVANSE) 40 MG capsule; Take 1 capsule (40 mg total) by mouth every morning. -     buPROPion (WELLBUTRIN XL) 300 MG 24 hr tablet; Take 1 tablet (300 mg total) by mouth daily.  Anxiety -     buPROPion (WELLBUTRIN XL) 300 MG 24 hr tablet; Take 1 tablet (300 mg total) by mouth daily.  Reactive depression -     buPROPion (WELLBUTRIN XL) 300 MG 24 hr tablet; Take 1 tablet (300 mg total) by mouth daily.   Doing well. 3 month refills sent. Discussed ways to cope during [place] pandemic especially at home with 4 kids.   Follow Up Instructions:    I discussed the assessment and treatment plan with the patient. The patient was provided an opportunity to ask questions and all were answered. The patient agreed with the plan and demonstrated an understanding of the instructions.   The patient was advised to call back or seek an in-person evaluation if the symptoms worsen or if the condition fails to improve as anticipated.  I provided 25 minutes of non-face-to-face time during this encounter.   Tandy GawJade Breeback, PA-C

## 2018-07-23 ENCOUNTER — Encounter: Payer: Self-pay | Admitting: Physician Assistant

## 2018-08-25 ENCOUNTER — Encounter: Payer: BC Managed Care – PPO | Admitting: Physician Assistant

## 2018-08-25 ENCOUNTER — Telehealth: Payer: Self-pay

## 2018-08-25 NOTE — Telephone Encounter (Signed)
Patient calls stating she [place] the [place] and accidentally cut herself on her head with a rusty putty knife. Patient states cut is very small and not a concern, she was just wondering if she should update her tetanus shot. Last Tdap given 05/24/2015.  Please advise. Pt OK with you responding to her via MyChart

## 2018-08-25 NOTE — Telephone Encounter (Signed)
Sent mychart msg.

## 2018-09-02 ENCOUNTER — Ambulatory Visit (INDEPENDENT_AMBULATORY_CARE_PROVIDER_SITE_OTHER): Payer: BC Managed Care – PPO | Admitting: Physician Assistant

## 2018-09-02 ENCOUNTER — Encounter: Payer: Self-pay | Admitting: Physician Assistant

## 2018-09-02 VITALS — BP 129/83 | HR 109 | Temp 98.0°F | Ht 68.0 in | Wt 127.0 lb

## 2018-09-02 DIAGNOSIS — M79645 Pain in left finger(s): Secondary | ICD-10-CM

## 2018-09-02 DIAGNOSIS — R5383 Other fatigue: Secondary | ICD-10-CM

## 2018-09-02 DIAGNOSIS — Z0000 Encounter for general adult medical examination without abnormal findings: Secondary | ICD-10-CM

## 2018-09-02 DIAGNOSIS — M79644 Pain in right finger(s): Secondary | ICD-10-CM

## 2018-09-02 DIAGNOSIS — R7989 Other specified abnormal findings of blood chemistry: Secondary | ICD-10-CM | POA: Diagnosis not present

## 2018-09-02 DIAGNOSIS — E559 Vitamin D deficiency, unspecified: Secondary | ICD-10-CM | POA: Diagnosis not present

## 2018-09-02 NOTE — Patient Instructions (Signed)

## 2018-09-02 NOTE — Progress Notes (Signed)
Subjective:     Casey Sweeney is a 38 y.o. female and is here for a comprehensive physical exam. The patient reports problems - pt continues to have problems with her bilateral PIP index finger joint pain and stifffness. worse in the am. RF and ANA negative. ibuprofen does not help.  she would like some vitamin labs. She is a little more tired than usual and wants to make sure her vitamins are all good.      Social History   Socioeconomic History  . Marital status: Married    Spouse name: Not on file  . Number of children: Not on file  . Years of education: Not on file  . Highest education level: Not on file  Occupational History  . Occupation: unemployed  Social Needs  . Financial resource strain: Not on file  . Food insecurity    Worry: Not on file    Inability: Not on file  . Transportation needs    Medical: Not on file    Non-medical: Not on file  Tobacco Use  . Smoking status: Current Every Day Smoker    Packs/day: 1.00    Years: 10.00    Pack years: 10.00    Types: Cigarettes    Last attempt to quit: 04/12/2016    Years since quitting: 2.3  . Smokeless tobacco: Never Used  Substance and Sexual Activity  . Alcohol use: No  . Drug use: No  . Sexual activity: Yes    Partners: Male    Birth control/protection: I.U.D.  Lifestyle  . Physical activity    Days per week: Not on file    Minutes per session: Not on file  . Stress: Not on file  Relationships  . Social Musicianconnections    Talks on phone: Not on file    Gets together: Not on file    Attends religious service: Not on file    Active member of club or organization: Not on file    Attends meetings of clubs or organizations: Not on file    Relationship status: Not on file  . Intimate partner violence    Fear of current or ex partner: Not on file    Emotionally abused: Not on file    Physically abused: Not on file    Forced sexual activity: Not on file  Other Topics Concern  . Not on file  Social History  Narrative  . Not on file   Health Maintenance  Topic Date Due  . INFLUENZA VACCINE  10/11/2018  . PAP SMEAR-Modifier  02/05/2020  . TETANUS/TDAP  05/23/2025  . HIV Screening  Completed   Both pip ibuprofen doesn't work. No swelling.not warm.voltaren gel no The following portions of the patient's history were reviewed and updated as appropriate: allergies, current medications, past family history, past medical history, past social history, past surgical history and problem list.  Review of Systems Pertinent items noted in HPI and remainder of comprehensive ROS otherwise negative.   Objective:    BP 129/83   Pulse (!) 109   Temp 98 F (36.7 C) (Oral)   Ht 5\' 8"  (1.727 m)   Wt 127 lb (57.6 kg)   SpO2 99%   BMI 19.31 kg/m  General appearance: alert, cooperative and appears stated age Head: Normocephalic, without obvious abnormality, atraumatic Eyes: conjunctivae/corneas clear. PERRL, EOM's intact. Fundi benign. Ears: normal TM's and external ear canals both ears Nose: Nares normal. Septum midline. Mucosa normal. No drainage or sinus tenderness. Throat: lips, mucosa,  and tongue normal; teeth and gums normal Neck: no adenopathy, no carotid bruit, no JVD, supple, symmetrical, trachea midline and thyroid not enlarged, symmetric, no tenderness/mass/nodules Back: symmetric, no curvature. ROM normal. No CVA tenderness. Lungs: clear to auscultation bilaterally Heart: regular rate and rhythm, S1, S2 normal, no murmur, click, rub or gallop Abdomen: soft, non-tender; bowel sounds normal; no masses,  no organomegaly Extremities: extremities normal, atraumatic, no cyanosis or edema bilateral PIP index finger tenderness to palpate. No erythema, warmth, swelling noted. NROM.  Pulses: 2+ and symmetric Skin: Skin color, texture, turgor normal. No rashes or lesions Lymph nodes: Cervical, supraclavicular, and axillary nodes normal. Neurologic: Alert and oriented X 3, normal strength and tone.  Normal symmetric reflexes. Normal coordination and gait    Assessment:    Healthy female exam.      Plan:    Marland KitchenMarland KitchenSheralee was seen today for annual exam.  Diagnoses and all orders for this visit:  Routine physical examination  No energy -     TSH -     VITAMIN D 25 Hydroxy (Vit-D Deficiency, Fractures) -     B12 and Folate Panel -     CBC with Differential/Platelet -     Ferritin  Pain in finger of both hands   .Marland Kitchen Depression screen [facility] 2/9 09/02/2018 07/22/2018 03/21/2018 11/18/2017 06/03/2017  Decreased Interest 0 0 0 0 1  Down, Depressed, Hopeless 0 0 0 0 1  PHQ - 2 Score 0 0 0 0 2  Altered sleeping 0 - - 0 1  Tired, decreased energy 2 - - 0 2  Change in appetite 0 - - 0 1  Feeling bad or failure about yourself  0 - - 0 0  Trouble concentrating 0 - - 0 0  Moving slowly or fidgety/restless 0 - - 0 0  Suicidal thoughts 0 - - 0 0  PHQ-9 Score 2 - - 0 6  Difficult doing work/chores Not difficult at all - - Not difficult at all Somewhat difficult   .Marland Kitchen Discussed 150 minutes of exercise a week.  Encouraged vitamin D 1000 units and Calcium 1300mg  or 4 servings of dairy a day.  Labs up to date. Ordered vitamin panel for low energy.   Pt declined smoking cessation.   Initial work up done on PIP of left hand joint pain. Blood work negative for RA. xrays negative for any significant arthritis. Send to Dr. Darene Sweeney for further management.   See After Visit Summary for Counseling Recommendations

## 2018-09-03 ENCOUNTER — Encounter: Payer: Self-pay | Admitting: Physician Assistant

## 2018-09-03 ENCOUNTER — Ambulatory Visit (INDEPENDENT_AMBULATORY_CARE_PROVIDER_SITE_OTHER): Payer: BC Managed Care – PPO | Admitting: Sports Medicine

## 2018-09-03 ENCOUNTER — Encounter: Payer: Self-pay | Admitting: Sports Medicine

## 2018-09-03 VITALS — BP 130/90 | HR 113 | Ht 68.0 in | Wt 127.0 lb

## 2018-09-03 DIAGNOSIS — E538 Deficiency of other specified B group vitamins: Secondary | ICD-10-CM | POA: Diagnosis not present

## 2018-09-03 DIAGNOSIS — R5383 Other fatigue: Secondary | ICD-10-CM | POA: Insufficient documentation

## 2018-09-03 DIAGNOSIS — M19041 Primary osteoarthritis, right hand: Secondary | ICD-10-CM | POA: Insufficient documentation

## 2018-09-03 DIAGNOSIS — M19042 Primary osteoarthritis, left hand: Secondary | ICD-10-CM

## 2018-09-03 LAB — CBC WITH DIFFERENTIAL/PLATELET
Absolute Monocytes: 431 cells/uL (ref 200–950)
Basophils Absolute: 77 cells/uL (ref 0–200)
Basophils Relative: 1 %
Eosinophils Absolute: 354 cells/uL (ref 15–500)
Eosinophils Relative: 4.6 %
HCT: 40.1 % (ref 35.0–45.0)
Hemoglobin: 13.8 g/dL (ref 11.7–15.5)
Lymphs Abs: 2241 cells/uL (ref 850–3900)
MCH: 31.8 pg (ref 27.0–33.0)
MCHC: 34.4 g/dL (ref 32.0–36.0)
MCV: 92.4 fL (ref 80.0–100.0)
MPV: 10.9 fL (ref 7.5–12.5)
Monocytes Relative: 5.6 %
Neutro Abs: 4597 cells/uL (ref 1500–7800)
Neutrophils Relative %: 59.7 %
Platelets: 195 10*3/uL (ref 140–400)
RBC: 4.34 10*6/uL (ref 3.80–5.10)
RDW: 12 % (ref 11.0–15.0)
Total Lymphocyte: 29.1 %
WBC: 7.7 10*3/uL (ref 3.8–10.8)

## 2018-09-03 LAB — B12 AND FOLATE PANEL
Folate: 8.9 ng/mL
Vitamin B-12: 309 pg/mL (ref 200–1100)

## 2018-09-03 LAB — TSH: TSH: 2.44 mIU/L

## 2018-09-03 LAB — FERRITIN: Ferritin: 48 ng/mL (ref 16–154)

## 2018-09-03 LAB — VITAMIN D 25 HYDROXY (VIT D DEFICIENCY, FRACTURES): Vit D, 25-Hydroxy: 26 ng/mL — ABNORMAL LOW (ref 30–100)

## 2018-09-03 MED ORDER — CYANOCOBALAMIN 1000 MCG/ML IJ SOLN
1000.0000 ug | Freq: Once | INTRAMUSCULAR | Status: AC
  Administered 2018-09-03: 1000 ug via INTRAMUSCULAR

## 2018-09-03 MED ORDER — MELOXICAM 15 MG PO TABS: ORAL_TABLET | ORAL | 3 refills | Status: DC

## 2018-09-03 MED ORDER — ACETAMINOPHEN ER 650 MG PO TBCR: 650.0000 mg | EXTENDED_RELEASE_TABLET | Freq: Three times a day (TID) | ORAL | 3 refills | Status: DC | PRN

## 2018-09-03 NOTE — Assessment & Plan Note (Signed)
With Bouchard and Heberden nodes. Starting with meloxicam and arthritis from Tylenol. If this fails over a month we will add hand physical therapy. She had a negative rheumatoid work-up and unremarkable x-rays.

## 2018-09-03 NOTE — Progress Notes (Signed)
Call pt: thyroid looks great. No anemia. Iron stores look good. b12 very low normal. Start b12 1053mcg sublingual daily and see if that makes you feel any better. We could start you off with b12 shot as well if you would like here in office. Vitamin D still pending.

## 2018-09-03 NOTE — Progress Notes (Signed)
Vitamin D is a little low. Start D3 1000 units daily.

## 2018-09-03 NOTE — Progress Notes (Signed)
Subjective:    CC: Finger pain  HPI: This is a very pleasant 38 year old female, for months now she has had on and off pain in both second PIPs, worse in the morning with gelling, no triggering, no trauma.  Worse with prolonged use.  She had x-rays that were negative, as well as rheumatoid work-up that was negative.  Has not tried any oral NSAIDs for this, symptoms are moderate, intermittent.  I reviewed the past medical history, family history, social history, surgical history, and allergies today and no changes were needed.  Please see the problem list section below in epic for further details.  Past Medical History: Past Medical History:  Diagnosis Date  . Anxiety   . Depression   . Headache(784.0)    migraines  . History of recurrent UTIs   . Irritable bowel syndrome   . Kidney stones    Past Surgical History: Past Surgical History:  Procedure Laterality Date  . CHOLECYSTECTOMY  10/17/12   lap chole  . CHOLECYSTECTOMY N/A 10/17/2012   Procedure: LAPAROSCOPIC CHOLECYSTECTOMY;  Surgeon: Axel FillerArmando Ramirez, MD;  Location: [facility] OR;  Service: General;  Laterality: N/A;  . NO PAST SURGERIES    . SHOULDER SURGERY Right    Social History: Social History   Socioeconomic History  . Marital status: Married    Spouse name: Not on file  . Number of children: Not on file  . Years of education: Not on file  . Highest education level: Not on file  Occupational History  . Occupation: unemployed  Social Needs  . Financial resource strain: Not on file  . Food insecurity    Worry: Not on file    Inability: Not on file  . Transportation needs    Medical: Not on file    Non-medical: Not on file  Tobacco Use  . Smoking status: Current Every Day Smoker    Packs/day: 1.00    Years: 10.00    Pack years: 10.00    Types: Cigarettes    Last attempt to quit: 04/12/2016    Years since quitting: 2.3  . Smokeless tobacco: Never Used  Substance and Sexual Activity  . Alcohol use: No  . Drug use:  No  . Sexual activity: Yes    Partners: Male    Birth control/protection: I.U.D.  Lifestyle  . Physical activity    Days per week: Not on file    Minutes per session: Not on file  . Stress: Not on file  Relationships  . Social Musicianconnections    Talks on phone: Not on file    Gets together: Not on file    Attends religious service: Not on file    Active member of club or organization: Not on file    Attends meetings of clubs or organizations: Not on file    Relationship status: Not on file  Other Topics Concern  . Not on file  Social History Narrative  . Not on file   Family History: Family History  Problem Relation Age of Onset  . Cancer Other 80       unknown cancer  . Hypertension Mother   . Cancer Father        lung cancer  . Cancer Paternal Aunt        breast  . Cancer Maternal Grandfather        unknown   Allergies: Allergies  Allergen Reactions  . Lexapro [Escitalopram Oxalate]     Worsening depression.   Wilhemena Durie. Strattera [Atomoxetine Hcl]  Heart burn   Medications: See med rec.  Review of Systems: No fevers, chills, night sweats, weight loss, chest pain, or shortness of breath.   Objective:    General: Well Developed, well nourished, and in no acute distress.  Neuro: Alert and oriented x3, extra-ocular muscles intact, sensation grossly intact.  HEENT: Normocephalic, atraumatic, pupils equal round reactive to light, neck supple, no masses, no lymphadenopathy, thyroid nonpalpable.  Skin: Warm and dry, no rashes. Cardiac: Regular rate and rhythm, no murmurs rubs or gallops, no lower extremity edema.  Respiratory: Clear to auscultation bilaterally. Not using accessory muscles, speaking in full sentences. Hands: Mild Bouchard and Heberden nodes throughout the fingers, no palpable synovitis, good motion, good strength, no flexor tendon nodules and no triggering.  Impression and Recommendations:    Primary osteoarthritis of both hands With Bouchard and Heberden  nodes. Starting with meloxicam and arthritis from Tylenol. If this fails over a month we will add hand physical therapy. She had a negative rheumatoid work-up and unremarkable x-rays.   ___________________________________________ Gwen Her. Dianah Field, M.D., ABFM., CAQSM. Primary Care and Sports Medicine [facility]  Adjunct Professor of [employer]

## 2018-10-01 ENCOUNTER — Ambulatory Visit: Payer: BC Managed Care – PPO | Admitting: Sports Medicine

## 2018-10-10 ENCOUNTER — Other Ambulatory Visit: Payer: Self-pay | Admitting: Physician Assistant

## 2018-10-10 DIAGNOSIS — R1012 Left upper quadrant pain: Secondary | ICD-10-CM

## 2018-10-10 MED ORDER — DICYCLOMINE HCL 10 MG PO CAPS: 10.0000 mg | ORAL_CAPSULE | Freq: Three times a day (TID) | ORAL | 0 refills | Status: DC

## 2018-10-24 ENCOUNTER — Encounter: Payer: Self-pay | Admitting: Physician Assistant

## 2018-10-24 NOTE — Telephone Encounter (Signed)
Can you put her on Metheney's schedule at 2?  She currently has an opening.

## 2018-10-28 ENCOUNTER — Ambulatory Visit (INDEPENDENT_AMBULATORY_CARE_PROVIDER_SITE_OTHER): Payer: BC Managed Care – PPO | Admitting: Physician Assistant

## 2018-10-28 ENCOUNTER — Encounter: Payer: Self-pay | Admitting: Physician Assistant

## 2018-10-28 VITALS — Ht 68.0 in | Wt 127.0 lb

## 2018-10-28 DIAGNOSIS — L03115 Cellulitis of right lower limb: Secondary | ICD-10-CM | POA: Diagnosis not present

## 2018-10-28 DIAGNOSIS — T63451A Toxic effect of venom of hornets, accidental (unintentional), initial encounter: Secondary | ICD-10-CM | POA: Diagnosis not present

## 2018-10-28 MED ORDER — PREDNISONE 50 MG PO TABS: ORAL_TABLET | ORAL | 0 refills | Status: DC

## 2018-10-28 MED ORDER — CEPHALEXIN 500 MG PO CAPS: 500.0000 mg | ORAL_CAPSULE | Freq: Two times a day (BID) | ORAL | 0 refills | Status: DC

## 2018-10-28 NOTE — Progress Notes (Deleted)
Pain with hornet sting. Happened yesterday. Today - red/swollen/burning pain  Ice relieves pain.

## 2018-10-28 NOTE — Progress Notes (Signed)
Patient ID: Casey Sweeney, female   DOB: 1980-06-01, 38 y.o.   MRN: [id number] .Marland KitchenVirtual Visit via Video Note  I connected with Quinn Axe on 10/29/18 at  4:20 PM EDT by a video enabled telemedicine application and verified that I am speaking with the correct person using two identifiers.  Location: Patient: car Provider: clinic   I discussed the limitations of evaluation and management by telemedicine and the availability of in person appointments. The patient expressed understanding and agreed to proceed.  History of Present Illness: Patient is a 38 year old female who comes into the clinic with a hornet sting of her right medial ankle.  She approximately got stung on Sunday night about 3 days ago.  She was able to kill the hornet that stung her.  He has never been stung by a hornet.  It was extremely painful.  The results of initial swelling and burning.  Her pain has improved some but it is still very painful.  It hurts when she walks where the sun hits it.  The only thing that makes it better is cool compresses.  She is also been using ibuprofen and Benadryl.  She denies any shortness of breath, cough, lip swelling, trouble breathing or swallowing. Pt is concerned with warmth and redness that is spreading.   .. Active Ambulatory Problems    Diagnosis Date Noted  . Encounter for IUD insertion 02/04/2012  . Anxiety 03/26/2012  . Reactive depression 03/26/2012  . Patellofemoral syndrome of left knee 09/16/2012  . Right shoulder pain 07/28/2013  . Cubital tunnel syndrome on right 07/28/2013  . Family hx-breast malignancy 04/06/2014  . Breast tenderness in female 04/06/2014  . Fullness of breast 04/06/2014  . DDD (degenerative disc disease), cervical 03/22/2015  . Currently attempting to quit smoking 03/30/2015  . Generalized abdominal pain 03/30/2015  . Capsulitis of metatarsophalangeal (MTP) joint of left foot 02/01/2016  . Onychomycosis 02/17/2016  . Stress at home 04/19/2016   . Irritable bowel syndrome with both constipation and diarrhea 05/25/2016  . GAD (generalized anxiety disorder) 12/17/2016  . Inattention 12/17/2016  . History of attention deficit hyperactivity disorder (ADHD) 12/18/2016  . ADD (attention deficit disorder) 02/20/2017  . Obsessive behaviors 02/20/2017  . Obsessive compulsive disorder 02/22/2017  . Recurrent sinusitis 04/24/2017  . Right sided facial pain 04/24/2017  . Mastoid pain, right 04/24/2017  . Chronic maxillary sinusitis 04/24/2017  . Tachycardia 04/25/2017  . Weight loss 11/18/2017  . Arthralgia of left hand 05/07/2018  . Right elbow pain 05/07/2018  . Primary osteoarthritis of both hands 09/03/2018  . No energy 09/03/2018   Resolved Ambulatory Problems    Diagnosis Date Noted  . PHARYNGITIS, STREPTOCOCCAL 01/03/2010  . Acute maxillary sinusitis 04/02/2009  . NAUSEA 02/09/2008  . FLANK PAIN, LEFT 02/09/2008  . Acute sinusitis, unspecified 10/19/2010  . Ingrown left big toenail 07/28/2013  . Body aches 04/06/2014  . Viral pharyngitis 09/20/2014  . Constipation 03/30/2015  . Influenza A 06/06/2015  . Stress at work 04/19/2016   Past Medical History:  Diagnosis Date  . Depression   . Headache(784.0)   . History of recurrent UTIs   . Irritable bowel syndrome   . Kidney stones    Reviewed med, allergy, problem list.    Observations/Objective: Patient is observed to be in pain.  Right medial ankle with obvious sting punctate hole with surrounding swelling, redness and per patient warmth extending up ankle and into lower leg.  No observed lip swelling, labored breathing.  Assessment and Plan: Marland Kitchen.Marland Kitchen.[name] was seen today for insect bite.  Diagnoses and all orders for this visit:  Hornet sting, accidental or unintentional, initial encounter -     predniSONE (DELTASONE) 50 MG tablet; One tab PO daily for 5 days. -     cephALEXin (KEFLEX) 500 MG capsule; Take 1 capsule (500 mg total) by mouth 2 (two) times  daily.  Cellulitis of right lower extremity -     predniSONE (DELTASONE) 50 MG tablet; One tab PO daily for 5 days. -     cephALEXin (KEFLEX) 500 MG capsule; Take 1 capsule (500 mg total) by mouth 2 (two) times daily.   Discussed with patient how painful wants things can be.  Certainly use Tylenol alternate with ibuprofen for pain.  Continue cool compresses regularly.  I am concerned there could be some concomitant cellulitis.  I will start Keflex.  I will also give a burst of prednisone due to the potent venom of a hornet.  Follow-up as needed.  Certainly can keep legs elevated to help with any swelling.  Reassured patient if she were to have anaphylactic shock she should have already encountered this.  Follow Up Instructions:    I discussed the assessment and treatment plan with the patient. The patient was provided an opportunity to ask questions and all were answered. The patient agreed with the plan and demonstrated an understanding of the instructions.   The patient was advised to call back or seek an in-person evaluation if the symptoms worsen or if the condition fails to improve as anticipated.    Tandy GawJade Kimber Esterly, PA-C

## 2018-10-29 ENCOUNTER — Encounter: Payer: Self-pay | Admitting: Physician Assistant

## 2018-11-10 ENCOUNTER — Encounter: Payer: Self-pay | Admitting: Physician Assistant

## 2018-11-13 ENCOUNTER — Other Ambulatory Visit: Payer: Self-pay

## 2018-11-13 ENCOUNTER — Encounter: Payer: Self-pay | Admitting: Physician Assistant

## 2018-11-13 ENCOUNTER — Ambulatory Visit (INDEPENDENT_AMBULATORY_CARE_PROVIDER_SITE_OTHER): Payer: BC Managed Care – PPO | Admitting: Physician Assistant

## 2018-11-13 VITALS — BP 113/70 | HR 96 | Temp 98.3°F | Ht 68.0 in | Wt 128.0 lb

## 2018-11-13 DIAGNOSIS — N3001 Acute cystitis with hematuria: Secondary | ICD-10-CM | POA: Diagnosis not present

## 2018-11-13 DIAGNOSIS — R8290 Unspecified abnormal findings in urine: Secondary | ICD-10-CM

## 2018-11-13 DIAGNOSIS — Z23 Encounter for immunization: Secondary | ICD-10-CM | POA: Diagnosis not present

## 2018-11-13 LAB — POCT URINALYSIS DIPSTICK
Bilirubin, UA: NEGATIVE
Glucose, UA: NEGATIVE
Ketones, UA: NEGATIVE
Nitrite, UA: POSITIVE
Protein, UA: POSITIVE — AB
Spec Grav, UA: >=1.03 — AB (ref 1.010–1.025)
Urobilinogen, UA: 0.2 E.U./dL
pH, UA: 5.5 (ref 5.0–8.0)

## 2018-11-13 MED ORDER — NITROFURANTOIN MONOHYD MACRO 100 MG PO CAPS: 100.0000 mg | ORAL_CAPSULE | Freq: Two times a day (BID) | ORAL | 0 refills | Status: DC

## 2018-11-13 MED ORDER — PHENAZOPYRIDINE HCL 200 MG PO TABS: 200.0000 mg | ORAL_TABLET | Freq: Three times a day (TID) | ORAL | 0 refills | Status: AC

## 2018-11-13 NOTE — Progress Notes (Signed)
Subjective:    Patient ID: Casey Sweeney, female    DOB: May 14, 1980, 38 y.o.   MRN: [id number]  HPI  Pt is a 38 yo female with hx of UTI who presents to the clinic with dysuria, bladder pressure, and urine odor for the last week. She has been drinking cranberry juice hoping it would "just go away". She is now having more lower abdominal pressure and her right flank is "achy". No fever, chills, nausea, vomiting.   .. Active Ambulatory Problems    Diagnosis Date Noted  . Encounter for IUD insertion 02/04/2012  . Anxiety 03/26/2012  . Reactive depression 03/26/2012  . Patellofemoral syndrome of left knee 09/16/2012  . Right shoulder pain 07/28/2013  . Cubital tunnel syndrome on right 07/28/2013  . Family hx-breast malignancy 04/06/2014  . Breast tenderness in female 04/06/2014  . Fullness of breast 04/06/2014  . DDD (degenerative disc disease), cervical 03/22/2015  . Currently attempting to quit smoking 03/30/2015  . Generalized abdominal pain 03/30/2015  . Capsulitis of metatarsophalangeal (MTP) joint of left foot 02/01/2016  . Onychomycosis 02/17/2016  . Stress at home 04/19/2016  . Irritable bowel syndrome with both constipation and diarrhea 05/25/2016  . GAD (generalized anxiety disorder) 12/17/2016  . Inattention 12/17/2016  . History of attention deficit hyperactivity disorder (ADHD) 12/18/2016  . ADD (attention deficit disorder) 02/20/2017  . Obsessive behaviors 02/20/2017  . Obsessive compulsive disorder 02/22/2017  . Recurrent sinusitis 04/24/2017  . Right sided facial pain 04/24/2017  . Mastoid pain, right 04/24/2017  . Chronic maxillary sinusitis 04/24/2017  . Tachycardia 04/25/2017  . Weight loss 11/18/2017  . Arthralgia of left hand 05/07/2018  . Right elbow pain 05/07/2018  . Primary osteoarthritis of both hands 09/03/2018  . No energy 09/03/2018   Resolved Ambulatory Problems    Diagnosis Date Noted  . PHARYNGITIS, STREPTOCOCCAL 01/03/2010  . Acute  maxillary sinusitis 04/02/2009  . NAUSEA 02/09/2008  . FLANK PAIN, LEFT 02/09/2008  . Acute sinusitis, unspecified 10/19/2010  . Ingrown left big toenail 07/28/2013  . Body aches 04/06/2014  . Viral pharyngitis 09/20/2014  . Constipation 03/30/2015  . Influenza A 06/06/2015  . Stress at work 04/19/2016   Past Medical History:  Diagnosis Date  . Depression   . Headache(784.0)   . History of recurrent UTIs   . Irritable bowel syndrome   . Kidney stones      Review of Systems See HPI>     Objective:   Physical Exam Vitals signs reviewed.  Constitutional:      Appearance: Normal appearance.  HENT:     Head: Normocephalic.  Cardiovascular:     Rate and Rhythm: Normal rate and regular rhythm.     Pulses: Normal pulses.  Pulmonary:     Effort: Pulmonary effort is normal.     Breath sounds: Normal breath sounds.  Abdominal:     General: Bowel sounds are normal.     Palpations: Abdomen is soft.     Tenderness: There is abdominal tenderness. There is right CVA tenderness.     Comments: Suprapubic and left and right lower quadrant tenderness.   Neurological:     General: No focal deficit present.     Mental Status: She is alert.  Psychiatric:        Mood and Affect: Mood normal.        Behavior: Behavior normal.           Assessment & Plan:  Marland KitchenMarland KitchenChantilly was seen today for urinary tract  infection.  Diagnoses and all orders for this visit:  Acute cystitis with hematuria -     nitrofurantoin, macrocrystal-monohydrate, (MACROBID) 100 MG capsule; Take 1 capsule (100 mg total) by mouth 2 (two) times daily. -     phenazopyridine (PYRIDIUM) 200 MG tablet; Take 1 tablet (200 mg total) by mouth 3 (three) times daily for 2 days.  Abnormal urine odor -     POCT urinalysis dipstick  Flu vaccine need -     Flu Vaccine QUAD 36+ mos IM   .Marland Kitchen. Results for orders placed or performed in visit on 11/13/18  POCT urinalysis dipstick  Result Value Ref Range   Color, UA yellow     Clarity, UA clear    Glucose, UA Negative Negative   Bilirubin, UA negative    Ketones, UA negative    Spec Grav, UA >=1.030 (A) 1.010 - 1.025   Blood, UA moderate    pH, UA 5.5 5.0 - 8.0   Protein, UA Positive (A) Negative   Urobilinogen, UA 0.2 0.2 or 1.0 E.U./dL   Nitrite, UA positive    Leukocytes, UA Small (1+) (A) Negative   Appearance     Odor      UA positive for nitrites/leukocytes/blood.  No fever today with right flank pain concern for kidney infection developing. Treated with macrobid and pyridium.  HO given with symptomatic care.  Follow up as needed or if symptoms worsen.

## 2018-11-13 NOTE — Patient Instructions (Signed)
Urinary Tract Infection, Adult A urinary tract infection (UTI) is an infection of any part of the urinary tract. The urinary tract includes:  The kidneys.  The ureters.  The bladder.  The urethra. These organs make, store, and get rid of pee (urine) in the body. What are the causes? This is caused by germs (bacteria) in your genital area. These germs grow and cause swelling (inflammation) of your urinary tract. What increases the risk? You are more likely to develop this condition if:  You have a small, thin tube (catheter) to drain pee.  You cannot control when you pee or poop (incontinence).  You are female, and: ? You use these methods to prevent pregnancy: ? A medicine that kills sperm (spermicide). ? A device that blocks sperm (diaphragm). ? You have low levels of a female hormone (estrogen). ? You are pregnant.  You have genes that add to your risk.  You are sexually active.  You take antibiotic medicines.  You have trouble peeing because of: ? A prostate that is bigger than normal, if you are female. ? A blockage in the part of your body that drains pee from the bladder (urethra). ? A kidney stone. ? A nerve condition that affects your bladder (neurogenic bladder). ? Not getting enough to drink. ? Not peeing often enough.  You have other conditions, such as: ? Diabetes. ? A weak disease-fighting system (immune system). ? Sickle cell disease. ? Gout. ? Injury of the spine. What are the signs or symptoms? Symptoms of this condition include:  Needing to pee right away (urgently).  Peeing often.  Peeing small amounts often.  Pain or burning when peeing.  Blood in the pee.  Pee that smells bad or not like normal.  Trouble peeing.  Pee that is cloudy.  Fluid coming from the vagina, if you are female.  Pain in the belly or lower back. Other symptoms include:  Throwing up (vomiting).  No urge to eat.  Feeling mixed up (confused).  Being tired  and grouchy (irritable).  A fever.  Watery poop (diarrhea). How is this treated? This condition may be treated with:  Antibiotic medicine.  Other medicines.  Drinking enough water. Follow these instructions at home:  Medicines  Take over-the-counter and prescription medicines only as told by your doctor.  If you were prescribed an antibiotic medicine, take it as told by your doctor. Do not stop taking it even if you start to feel better. General instructions  Make sure you: ? Pee until your bladder is empty. ? Do not hold pee for a long time. ? Empty your bladder after sex. ? Wipe from front to back after pooping if you are a female. Use each tissue one time when you wipe.  Drink enough fluid to keep your pee pale yellow.  Keep all follow-up visits as told by your doctor. This is important. Contact a doctor if:  You do not get better after 1-2 days.  Your symptoms go away and then come back. Get help right away if:  You have very bad back pain.  You have very bad pain in your lower belly.  You have a fever.  You are sick to your stomach (nauseous).  You are throwing up. Summary  A urinary tract infection (UTI) is an infection of any part of the urinary tract.  This condition is caused by germs in your genital area.  There are many risk factors for a UTI. These include having a small, thin   tube to drain pee and not being able to control when you pee or poop.  Treatment includes antibiotic medicines for germs.  Drink enough fluid to keep your pee pale yellow. This information is not intended to replace advice given to you by your health care provider. Make sure you discuss any questions you have with your health care provider. Document Released: 08/15/2007 Document Revised: 02/13/2018 Document Reviewed: 09/05/2017 Elsevier Patient Education  2020 [employer]  

## 2018-11-26 ENCOUNTER — Encounter: Payer: Self-pay | Admitting: Physician Assistant

## 2018-11-26 ENCOUNTER — Other Ambulatory Visit: Payer: Self-pay | Admitting: Physician Assistant

## 2018-11-26 DIAGNOSIS — F988 Other specified behavioral and emotional disorders with onset usually occurring in childhood and adolescence: Secondary | ICD-10-CM

## 2018-11-26 MED ORDER — LISDEXAMFETAMINE DIMESYLATE 40 MG PO CAPS: 40.0000 mg | ORAL_CAPSULE | ORAL | 0 refills | Status: DC

## 2018-11-26 NOTE — Telephone Encounter (Signed)
RX pended 

## 2018-11-27 MED ORDER — LISDEXAMFETAMINE DIMESYLATE 40 MG PO CAPS: 40.0000 mg | ORAL_CAPSULE | ORAL | 0 refills | Status: DC

## 2018-12-02 ENCOUNTER — Other Ambulatory Visit: Payer: Self-pay | Admitting: Physician Assistant

## 2018-12-02 DIAGNOSIS — R1012 Left upper quadrant pain: Secondary | ICD-10-CM

## 2019-01-26 ENCOUNTER — Other Ambulatory Visit: Payer: Self-pay | Admitting: Physician Assistant

## 2019-01-26 ENCOUNTER — Telehealth: Payer: Self-pay | Admitting: Physician Assistant

## 2019-01-26 DIAGNOSIS — R1012 Left upper quadrant pain: Secondary | ICD-10-CM

## 2019-01-26 DIAGNOSIS — F988 Other specified behavioral and emotional disorders with onset usually occurring in childhood and adolescence: Secondary | ICD-10-CM

## 2019-01-27 MED ORDER — DICYCLOMINE HCL 10 MG PO CAPS: 10.0000 mg | ORAL_CAPSULE | Freq: Four times a day (QID) | ORAL | 1 refills | Status: DC

## 2019-01-27 MED ORDER — LISDEXAMFETAMINE DIMESYLATE 40 MG PO CAPS: 40.0000 mg | ORAL_CAPSULE | ORAL | 0 refills | Status: DC

## 2019-01-27 NOTE — Telephone Encounter (Signed)
Please call patient to schedule a follow up visit for ADHD.

## 2019-01-29 ENCOUNTER — Encounter: Payer: Self-pay | Admitting: Family Medicine

## 2019-01-29 ENCOUNTER — Ambulatory Visit (INDEPENDENT_AMBULATORY_CARE_PROVIDER_SITE_OTHER): Payer: BC Managed Care – PPO | Admitting: Family Medicine

## 2019-01-29 ENCOUNTER — Encounter: Payer: Self-pay | Admitting: Physician Assistant

## 2019-01-29 VITALS — BP 139/93 | HR 99 | Ht 68.0 in | Wt 127.0 lb

## 2019-01-29 DIAGNOSIS — R030 Elevated blood-pressure reading, without diagnosis of hypertension: Secondary | ICD-10-CM

## 2019-01-29 DIAGNOSIS — F988 Other specified behavioral and emotional disorders with onset usually occurring in childhood and adolescence: Secondary | ICD-10-CM | POA: Diagnosis not present

## 2019-01-29 DIAGNOSIS — F439 Reaction to severe stress, unspecified: Secondary | ICD-10-CM

## 2019-01-29 DIAGNOSIS — F419 Anxiety disorder, unspecified: Secondary | ICD-10-CM

## 2019-01-29 DIAGNOSIS — F411 Generalized anxiety disorder: Secondary | ICD-10-CM

## 2019-01-29 NOTE — Patient Instructions (Addendum)
Please stop your Vyvanse, Sudafed and meloxicam.  Do not take any Aleve or ibuprofen for the next several days.  Only check your blood pressure twice a day once in the morning and once in the evening.  If it is coming down that on Monday you can restart your Vyvanse keep an eye on your blood pressure for a couple of days again only checking at the most twice a day.  And then if it stays down then you can add back your meloxicam if needed.  Or just rely on Tylenol for now.  Work on reducing her stress levels this weekend and doing something for you that recharging and really feels you with some positive energy.  Like to rest and sleep and take good care of yourself.  Really hydrate well and eat nourishing foods.

## 2019-01-29 NOTE — Telephone Encounter (Signed)
Patient seen in office

## 2019-01-29 NOTE — Progress Notes (Signed)
Acute Office Visit  Subjective:    Patient ID: Casey Sweeney, female    DOB: 11/24/1980, 38 y.o.   MRN: [id number]  Chief Complaint  Patient presents with  . Hypertension    HPI Patient is in today for elevated blood pressure levels.  She reports that in the last couple days she has been feeling like her neck will flush and then it will feel tight most like it swollen.  Her hands will start to feel tight as well I was like a blood pressure cuff squeezing but then when she looks at her hands they are not actually swollen.  She says last night she actually started checking her blood pressure.  She had an old blood pressure cuff that she is used on and off for the last 10 years.  Blood pressures were reading in the 140s over 90s.  Lowest blood pressure was 140/80 and highest blood pressure was 155/94.  She did bring in the cuff with her today to compare to our cuff.  Her home monitor read 164/100 in our office blood pressure monitor read 147/91.  About a 15 point difference between the 2 machines.  She says she just felt bad enough today that she ended up leaving work early.  She says she really enjoys her workplace she finds it is not stressful at all in fact it is a good release for her.  She has 4 children and they are mostly doing virtual learning at home.  And she has had some major life events recently.  Her mother has an aortic aneurysm and just had a repair recently in fact her mom is actually at the hospital right now.  She has a maternal uncle who was just diagnosed with metastatic stomach cancer and will likely pass in the next couple of weeks.  So she really has had a lot going on.  She also more recently has been taking Sudafed for the last 2-week because of allergies.  We had a warm snap after a recent cold snap and says it really just flared up her allergies.  She denies any lightheadedness or headaches or palpitations.  She has not had any chest pain.  She does have ADD and currently  takes Vyvanse but is never had any problems or chest pain or blood pressure issues with taking the medication.  She also takes meloxicam.  She is tearful . Mom had major surger. Husband lost her job. Her kids are at home with school. Uncle has cancer.     Note she did have problems with hypertension during 2 of her 4 pregnancies.  Past Medical History:  Diagnosis Date  . Anxiety   . Depression   . Headache(784.0)    migraines  . History of recurrent UTIs   . Irritable bowel syndrome   . Kidney stones     Past Surgical History:  Procedure Laterality Date  . CHOLECYSTECTOMY  10/17/12   lap chole  . CHOLECYSTECTOMY N/A 10/17/2012   Procedure: LAPAROSCOPIC CHOLECYSTECTOMY;  Surgeon: Axel FillerArmando Ramirez, MD;  Location: [facility] OR;  Service: General;  Laterality: N/A;  . NO PAST SURGERIES    . SHOULDER SURGERY Right     Family History  Problem Relation Age of Onset  . Cancer Other 80       unknown cancer  . Hypertension Mother   . Aortic aneurysm Mother   . Lung cancer Father        lung cancer  . Cancer Paternal Aunt  breast  . Cancer Maternal Grandfather        unknown  . Stomach cancer Maternal Uncle     Social History   Socioeconomic History  . Marital status: Married    Spouse name: Not on file  . Number of children: Not on file  . Years of education: Not on file  . Highest education level: Not on file  Occupational History  . Occupation: unemployed  Social Needs  . Financial resource strain: Not on file  . Food insecurity    Worry: Not on file    Inability: Not on file  . Transportation needs    Medical: Not on file    Non-medical: Not on file  Tobacco Use  . Smoking status: Current Every Day Smoker    Packs/day: 1.00    Years: 10.00    Pack years: 10.00    Types: Cigarettes    Last attempt to quit: 04/12/2016    Years since quitting: 2.8  . Smokeless tobacco: Never Used  Substance and Sexual Activity  . Alcohol use: No  . Drug use: No  . Sexual  activity: Yes    Partners: Male    Birth control/protection: I.U.D.  Lifestyle  . Physical activity    Days per week: Not on file    Minutes per session: Not on file  . Stress: Not on file  Relationships  . Social Herbalist on phone: Not on file    Gets together: Not on file    Attends religious service: Not on file    Active member of club or organization: Not on file    Attends meetings of clubs or organizations: Not on file    Relationship status: Not on file  . Intimate partner violence    Fear of current or ex partner: Not on file    Emotionally abused: Not on file    Physically abused: Not on file    Forced sexual activity: Not on file  Other Topics Concern  . Not on file  Social History Narrative  . Not on file    Outpatient Medications Prior to Visit  Medication Sig Dispense Refill  . acetaminophen (TYLENOL) 650 MG CR tablet Take 1 tablet (650 mg total) by mouth every 8 (eight) hours as needed for pain. 90 tablet 3  . buPROPion (WELLBUTRIN XL) 300 MG 24 hr tablet Take 1 tablet (300 mg total) by mouth daily. 90 tablet 3  . dicyclomine (BENTYL) 10 MG capsule Take 1 capsule (10 mg total) by mouth 4 (four) times daily. 120 capsule 1  . lisdexamfetamine (VYVANSE) 40 MG capsule Take 1 capsule (40 mg total) by mouth every morning. 30 capsule 0  . lisdexamfetamine (VYVANSE) 40 MG capsule Take 1 capsule (40 mg total) by mouth every morning. 30 capsule 0  . lisdexamfetamine (VYVANSE) 40 MG capsule Take 1 capsule (40 mg total) by mouth every morning. 30 capsule 0  . meloxicam (MOBIC) 15 MG tablet One tab PO qAM with breakfast for 2 weeks, then daily prn pain. 30 tablet 3  . nitrofurantoin, macrocrystal-monohydrate, (MACROBID) 100 MG capsule Take 1 capsule (100 mg total) by mouth 2 (two) times daily. 14 capsule 0   No facility-administered medications prior to visit.     Allergies  Allergen Reactions  . Lexapro [Escitalopram Oxalate]     Worsening depression.   Christianne Borrow [Atomoxetine Hcl]     Heart burn    ROS     Objective:  Physical Exam  Constitutional: She is oriented to person, place, and time. She appears well-developed and well-nourished.  HENT:  Head: Normocephalic and atraumatic.  Right Ear: External ear normal.  Left Ear: External ear normal.  Eyes: Conjunctivae are normal.  Neck: Neck supple. No thyromegaly present.  Cardiovascular: Normal rate, regular rhythm and normal heart sounds.  Pulmonary/Chest: Effort normal and breath sounds normal.  Lymphadenopathy:    She has no cervical adenopathy.  Neurological: She is alert and oriented to person, place, and time.  Skin: Skin is warm and dry.  Psychiatric: She has a normal mood and affect. Her behavior is normal.    BP (!) 139/93   Pulse 99   Ht 5\' 8"  (1.727 m)   Wt 127 lb (57.6 kg)   SpO2 100%   BMI 19.31 kg/m  Wt Readings from Last 3 Encounters:  01/29/19 127 lb (57.6 kg)  11/13/18 128 lb (58.1 kg)  10/28/18 127 lb (57.6 kg)    There are no preventive care reminders to display for this patient.  There are no preventive care reminders to display for this patient.   Lab Results  Component Value Date   TSH 2.44 09/02/2018   Lab Results  Component Value Date   WBC 7.7 09/02/2018   HGB 13.8 09/02/2018   HCT 40.1 09/02/2018   MCV 92.4 09/02/2018   PLT 195 09/02/2018   Lab Results  Component Value Date   NA 140 06/04/2018   K 3.8 06/04/2018   CO2 26 06/04/2018   GLUCOSE 90 06/04/2018   BUN 12 06/04/2018   CREATININE 0.92 06/04/2018   BILITOT 0.5 06/04/2018   ALKPHOS 77 05/23/2016   AST 15 06/04/2018   ALT 13 06/04/2018   PROT 6.4 06/04/2018   ALBUMIN 4.4 05/23/2016   CALCIUM 9.2 06/04/2018   Lab Results  Component Value Date   CHOL 152 06/05/2017   Lab Results  Component Value Date   HDL 28 (L) 06/05/2017   Lab Results  Component Value Date   LDLCALC 107 (H) 06/05/2017   Lab Results  Component Value Date   TRIG 78 06/05/2017   Lab  Results  Component Value Date   CHOLHDL 5.4 (H) 06/05/2017   No results found for: HGBA1C     Assessment & Plan:   Problem List Items Addressed This Visit      Other   GAD (generalized anxiety disorder)   Anxiety - Primary   Relevant Orders   CBC   COMPLETE METABOLIC PANEL WITH GFR   TSH   ADD (attention deficit disorder)    Other Visit Diagnoses    Elevated BP without diagnosis of hypertension       Relevant Orders   CBC   COMPLETE METABOLIC PANEL WITH GFR   TSH   Stress         Elevated blood pressure without diagnosis of hypertension-diastolic pressure is also elevated today.  We discussed stopping the Sudafed as well as the meloxicam.  Also want her to stop her Vyvanse at least through the weekend.  I do want her to take anything that can potentially raise her blood pressure and that includes other NSAIDs.  I want her to really rest and relax and try to do something for her that she really enjoys this weekend.  She says she really likes to hunt.  I want her to try to get some good sleep and see if her blood pressure goes down over the weekend I do not  want her to check it more than twice a day at the most.  Her blood pressure cuff seems to be reading significantly higher than ours.  So could consider getting a new cuff or putting new batteries in it.  If her blood pressure seems to improve then okay to restart her Vyvanse on Monday if after couple of days her blood pressure seems to stay normal then okay to restart the meloxicam.  Do not restart the Sudafed or other decongestants.  We will also get some blood work just to rule out any other underlying causes such as thyroid disorder etc.  Stress/anxiety-we discussed ways to really reduce her stress levels and try to do something for her self.  We also discussed try to take time for herself while coping with all these major changes going on in her life right now.  I would like for her to follow-up with PCP to discuss this further  next week.  We discussed that she really might benefit from being on something to really help reduce her anxiety levels.  We discussed how sometimes it can almost get disruptive to daily life and relationships etc. and that really she might benefit.  ADD-hold on Vyvanse through the weekend at least until her blood pressure improves.  No orders of the defined types were placed in this encounter.    Nani Gasser, MD

## 2019-01-30 LAB — TSH: TSH: 2.32 mIU/L

## 2019-01-30 LAB — COMPLETE METABOLIC PANEL WITH GFR
AG Ratio: 2.4 (calc) (ref 1.0–2.5)
ALT: 20 U/L (ref 6–29)
AST: 14 U/L (ref 10–30)
Albumin: 4.6 g/dL (ref 3.6–5.1)
Alkaline phosphatase (APISO): 55 U/L (ref 31–125)
BUN: 15 mg/dL (ref 7–25)
CO2: 24 mmol/L (ref 20–32)
Calcium: 9 mg/dL (ref 8.6–10.2)
Chloride: 106 mmol/L (ref 98–110)
Creat: 0.85 mg/dL (ref 0.50–1.10)
GFR, Est African American: 102 mL/min/{1.73_m2} (ref >=60)
GFR, Est Non African American: 88 mL/min/{1.73_m2} (ref >=60)
Globulin: 1.9 g/dL (calc) (ref 1.9–3.7)
Glucose, Bld: 67 mg/dL (ref 65–99)
Potassium: 3.5 mmol/L (ref 3.5–5.3)
Sodium: 141 mmol/L (ref 135–146)
Total Bilirubin: 0.4 mg/dL (ref 0.2–1.2)
Total Protein: 6.5 g/dL (ref 6.1–8.1)

## 2019-01-30 LAB — CBC
HCT: 39.4 % (ref 35.0–45.0)
Hemoglobin: 13.1 g/dL (ref 11.7–15.5)
MCH: 31.3 pg (ref 27.0–33.0)
MCHC: 33.2 g/dL (ref 32.0–36.0)
MCV: 94.3 fL (ref 80.0–100.0)
MPV: 10.8 fL (ref 7.5–12.5)
Platelets: 195 10*3/uL (ref 140–400)
RBC: 4.18 10*6/uL (ref 3.80–5.10)
RDW: 12.3 % (ref 11.0–15.0)
WBC: 7.1 10*3/uL (ref 3.8–10.8)

## 2019-01-30 NOTE — Progress Notes (Signed)
All labs are normal. 

## 2019-02-03 ENCOUNTER — Encounter: Payer: Self-pay | Admitting: Physician Assistant

## 2019-02-04 ENCOUNTER — Ambulatory Visit (INDEPENDENT_AMBULATORY_CARE_PROVIDER_SITE_OTHER): Payer: BC Managed Care – PPO | Admitting: Physician Assistant

## 2019-02-04 ENCOUNTER — Encounter: Payer: Self-pay | Admitting: Physician Assistant

## 2019-02-04 VITALS — BP 125/91 | HR 92 | Temp 97.8°F | Ht 68.0 in | Wt 128.0 lb

## 2019-02-04 DIAGNOSIS — F439 Reaction to severe stress, unspecified: Secondary | ICD-10-CM | POA: Diagnosis not present

## 2019-02-04 DIAGNOSIS — F419 Anxiety disorder, unspecified: Secondary | ICD-10-CM | POA: Diagnosis not present

## 2019-02-04 DIAGNOSIS — R030 Elevated blood-pressure reading, without diagnosis of hypertension: Secondary | ICD-10-CM | POA: Diagnosis not present

## 2019-02-04 DIAGNOSIS — F411 Generalized anxiety disorder: Secondary | ICD-10-CM

## 2019-02-04 MED ORDER — CLONAZEPAM 0.5 MG PO TABS: 0.5000 mg | ORAL_TABLET | Freq: Two times a day (BID) | ORAL | 1 refills | Status: DC | PRN

## 2019-02-04 NOTE — Progress Notes (Signed)
Per patient - Anxiety about the same as a week ago.  PHQ9-GAD7 completed. GAD score 9 today, 0 last week.

## 2019-02-04 NOTE — Progress Notes (Signed)
Patient ID: ALLEEN Sweeney, female   DOB: 12/16/1980, 38 y.o.   MRN: [id number] .Marland KitchenVirtual Visit via Video Note  I connected with Casey Sweeney on 02/07/19 at  1:20 PM EST by a video enabled telemedicine application and verified that I am speaking with the correct person using two identifiers.  Location: Patient: work Provider: clinic   I discussed the limitations of evaluation and management by telemedicine and the availability of in person appointments. The patient expressed understanding and agreed to proceed.  History of Present Illness: Pt is a 38 yo female who presents to the clinic to follow up. She was seen last week for stress reaction and elevated blood pressure. She has been checking her BP and much better. She restarted vyvanse. She is sleeping well. She remains anxious and stressed. Her uncle died of stomach cancer 2 days ago. She has waves of intense anxiety that is when she feels the flushing and heart beat in her neck. No headaches.   .. Active Ambulatory Problems    Diagnosis Date Noted  . Encounter for IUD insertion 02/04/2012  . Anxiety 03/26/2012  . Reactive depression 03/26/2012  . Patellofemoral syndrome of left knee 09/16/2012  . Right shoulder pain 07/28/2013  . Cubital tunnel syndrome on right 07/28/2013  . Family hx-breast malignancy 04/06/2014  . Breast tenderness in female 04/06/2014  . Fullness of breast 04/06/2014  . DDD (degenerative disc disease), cervical 03/22/2015  . Currently attempting to quit smoking 03/30/2015  . Generalized abdominal pain 03/30/2015  . Capsulitis of metatarsophalangeal (MTP) joint of left foot 02/01/2016  . Onychomycosis 02/17/2016  . Stress at home 04/19/2016  . Irritable bowel syndrome with both constipation and diarrhea 05/25/2016  . GAD (generalized anxiety disorder) 12/17/2016  . Inattention 12/17/2016  . History of attention deficit hyperactivity disorder (ADHD) 12/18/2016  . ADD (attention deficit disorder) 02/20/2017   . Obsessive behaviors 02/20/2017  . Obsessive compulsive disorder 02/22/2017  . Recurrent sinusitis 04/24/2017  . Right sided facial pain 04/24/2017  . Mastoid pain, right 04/24/2017  . Chronic maxillary sinusitis 04/24/2017  . Tachycardia 04/25/2017  . Weight loss 11/18/2017  . Arthralgia of left hand 05/07/2018  . Right elbow pain 05/07/2018  . Primary osteoarthritis of both hands 09/03/2018  . No energy 09/03/2018  . Stress 02/04/2019  . Elevated BP without diagnosis of hypertension 02/04/2019   Resolved Ambulatory Problems    Diagnosis Date Noted  . PHARYNGITIS, STREPTOCOCCAL 01/03/2010  . Acute maxillary sinusitis 04/02/2009  . NAUSEA 02/09/2008  . FLANK PAIN, LEFT 02/09/2008  . Acute sinusitis, unspecified 10/19/2010  . Ingrown left big toenail 07/28/2013  . Body aches 04/06/2014  . Viral pharyngitis 09/20/2014  . Constipation 03/30/2015  . Influenza A 06/06/2015  . Stress at work 04/19/2016   Past Medical History:  Diagnosis Date  . Depression   . Headache(784.0)   . History of recurrent UTIs   . Irritable bowel syndrome   . Kidney stones    Reviewed med, allergy, problem list.     Observations/Objective: No acute distress Normal mood and appearance.  Normal breathing.   .. Today's Vitals   02/04/19 1101  BP: (!) 125/91  Pulse: 92  Temp: 97.8 F (36.6 C)  TempSrc: Oral  Weight: 128 lb (58.1 kg)  Height: 5\' 8"  (1.727 m)   Body mass index is 19.46 kg/m.   .. Depression screen [facility] 2/9 02/04/2019 01/29/2019 09/02/2018 07/22/2018 03/21/2018  Decreased Interest 0 0 0 0 0  Down, Depressed, Hopeless 0  0 0 0 0  PHQ - 2 Score 0 0 0 0 0  Altered sleeping - 0 0 - -  Tired, decreased energy - 0 2 - -  Change in appetite - 0 0 - -  Feeling bad or failure about yourself  - 0 0 - -  Trouble concentrating - 0 0 - -  Moving slowly or fidgety/restless - 0 0 - -  Suicidal thoughts - 0 0 - -  PHQ-9 Score - 0 2 - -  Difficult doing work/chores - Not difficult  at all Not difficult at all - -   .Marland Kitchen GAD 7 : Generalized Anxiety Score 02/04/2019 01/29/2019 09/02/2018 07/22/2018  Nervous, Anxious, on Edge 2 0 0 1  Control/stop worrying 0 0 0 0  Worry too much - different things 1 0 0 0  Trouble relaxing 3 0 0 1  Restless 0 0 0 0  Easily annoyed or irritable 3 0 0 1  Afraid - awful might happen 0 0 0 0  Total GAD 7 Score 9 0 0 3  Anxiety Difficulty Not difficult at all Not difficult at all Not difficult at all Not difficult at all     Assessment and Plan: Marland KitchenMarland KitchenAtticus was seen today for follow-up.  Diagnoses and all orders for this visit:  Stress  GAD (generalized anxiety disorder)  Elevated BP without diagnosis of hypertension  Anxiety -     clonazePAM (KLONOPIN) 0.5 MG tablet; Take 1 tablet (0.5 mg total) by mouth 2 (two) times daily as needed for anxiety.   BP better today. She admits she has been under a lot of stress. Talking about it did help last week. She does feel better. She will use klonapin as needed. Discussed abuse potential and to only use PRN. If using regularly then need to consider daily medication. Encouraged exercise and counseling. Follow up in 2 months.    Follow Up Instructions:    I discussed the assessment and treatment plan with the patient. The patient was provided an opportunity to ask questions and all were answered. The patient agreed with the plan and demonstrated an understanding of the instructions.   The patient was advised to call back or seek an in-person evaluation if the symptoms worsen or if the condition fails to improve as anticipated.    Tandy Gaw, PA-C

## 2019-02-09 NOTE — Telephone Encounter (Signed)
Wanted to know if this was taken care of. Following up on messages and did not see a reply on this encounter for patient.

## 2019-03-09 ENCOUNTER — Other Ambulatory Visit: Payer: Self-pay | Admitting: Physician Assistant

## 2019-03-09 DIAGNOSIS — F988 Other specified behavioral and emotional disorders with onset usually occurring in childhood and adolescence: Secondary | ICD-10-CM

## 2019-03-10 MED ORDER — LISDEXAMFETAMINE DIMESYLATE 40 MG PO CAPS: 40.0000 mg | ORAL_CAPSULE | ORAL | 0 refills | Status: DC

## 2019-04-14 ENCOUNTER — Encounter: Payer: Self-pay | Admitting: Physician Assistant

## 2019-04-14 DIAGNOSIS — F988 Other specified behavioral and emotional disorders with onset usually occurring in childhood and adolescence: Secondary | ICD-10-CM

## 2019-04-14 MED ORDER — LISDEXAMFETAMINE DIMESYLATE 40 MG PO CAPS: 40.0000 mg | ORAL_CAPSULE | ORAL | 0 refills | Status: DC

## 2019-05-22 ENCOUNTER — Encounter: Payer: Self-pay | Admitting: Physician Assistant

## 2019-05-22 ENCOUNTER — Telehealth (INDEPENDENT_AMBULATORY_CARE_PROVIDER_SITE_OTHER): Payer: Self-pay | Admitting: Physician Assistant

## 2019-05-22 DIAGNOSIS — F419 Anxiety disorder, unspecified: Secondary | ICD-10-CM

## 2019-05-22 DIAGNOSIS — F988 Other specified behavioral and emotional disorders with onset usually occurring in childhood and adolescence: Secondary | ICD-10-CM

## 2019-05-22 DIAGNOSIS — F329 Major depressive disorder, single episode, unspecified: Secondary | ICD-10-CM

## 2019-05-22 MED ORDER — LISDEXAMFETAMINE DIMESYLATE 40 MG PO CAPS: 40.0000 mg | ORAL_CAPSULE | ORAL | 0 refills | Status: DC

## 2019-05-22 MED ORDER — BUSPIRONE HCL 5 MG PO TABS: 5.0000 mg | ORAL_TABLET | Freq: Three times a day (TID) | ORAL | 2 refills | Status: DC

## 2019-05-22 NOTE — Progress Notes (Signed)
Patient ID: [name], female   DOB: Sep 12, 1980, 39 y.o.   MRN: [id number] .Marland KitchenVirtual Visit via Video Note  I connected with Casey Sweeney on 05/22/2019 at 11:10 AM EST by a video enabled telemedicine application and verified that I am speaking with the correct person using two identifiers.  Location: Patient: home Provider: clinic   I discussed the limitations of evaluation and management by telemedicine and the availability of in person appointments. The patient expressed understanding and agreed to proceed.  History of Present Illness: Pt is a 39 yo female with ADHD, anxiety, depression who presents to the clinic for refills.   She is struggling a little with anxiety. She has been on and off Vyvanse and does not seem to make a difference. She has been on wellbutrin for a long a time. She will take clonazepam but seems to just make her more sleepy. Her mother is sick and seems to really bother her. 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 She just feels borderline panic like all day. Her kids are doing well. She is doing well just anxious. No SI/HC. Depression is controlled.   .. Active Ambulatory Problems    Diagnosis Date Noted  . Encounter for IUD insertion 02/04/2012  . Anxiety 03/26/2012  . Reactive depression 03/26/2012  . Patellofemoral syndrome of left knee 09/16/2012  . Right shoulder pain 07/28/2013  . Cubital tunnel syndrome on right 07/28/2013  . Family hx-breast malignancy 04/06/2014  . Breast tenderness in female 04/06/2014  . Fullness of breast 04/06/2014  . DDD (degenerative disc disease), cervical 03/22/2015  . Currently attempting to quit smoking 03/30/2015  . Generalized abdominal pain 03/30/2015  . Capsulitis of metatarsophalangeal (MTP) joint of left foot 02/01/2016  . Onychomycosis 02/17/2016  . Stress at home 04/19/2016  . Irritable bowel syndrome with both constipation and diarrhea 05/25/2016  . GAD  (generalized anxiety disorder) 12/17/2016  . Inattention 12/17/2016  . History of attention deficit hyperactivity disorder (ADHD) 12/18/2016  . ADD (attention deficit disorder) 02/20/2017  . Obsessive behaviors 02/20/2017  . Obsessive compulsive disorder 02/22/2017  . Recurrent sinusitis 04/24/2017  . Right sided facial pain 04/24/2017  . Mastoid pain, right 04/24/2017  . Chronic maxillary sinusitis 04/24/2017  . Tachycardia 04/25/2017  . Weight loss 11/18/2017  . Arthralgia of left hand 05/07/2018  . Right elbow pain 05/07/2018  . Primary osteoarthritis of both hands 09/03/2018  . No energy 09/03/2018  . Stress 02/04/2019  . Elevated BP without diagnosis of hypertension 02/04/2019   Resolved Ambulatory Problems    Diagnosis Date Noted  . PHARYNGITIS, STREPTOCOCCAL 01/03/2010  . Acute maxillary sinusitis 04/02/2009  . NAUSEA 02/09/2008  . FLANK PAIN, LEFT 02/09/2008  . Acute sinusitis, unspecified 10/19/2010  . Ingrown left big toenail 07/28/2013  . Body aches 04/06/2014  . Viral pharyngitis 09/20/2014  . Constipation 03/30/2015  . Influenza A 06/06/2015  . Stress at work 04/19/2016   Past Medical History:  Diagnosis Date  . Depression   . Headache(784.0)   . History of recurrent UTIs   . Irritable bowel syndrome   . Kidney stones    Reviewed med, allergy, problem list.     Observations/Objective: No acute distress.  Normal mood and appearance.  Normal breathing.   .. Today's Vitals   05/22/19 1058  Temp: (!) 97.5 F (36.4 C)  TempSrc: Oral  Weight: 125 lb (56.7 kg)  Height: 5\' 8"  (1.727 m)   Body  mass index is 19.01 kg/m.   .. Depression screen [facility] 2/9 05/22/2019 02/04/2019 01/29/2019 09/02/2018 07/22/2018  Decreased Interest 0 0 0 0 0  Down, Depressed, Hopeless 0 0 0 0 0  PHQ - 2 Score 0 0 0 0 0  Altered sleeping 0 - 0 0 -  Tired, decreased energy 3 - 0 2 -  Change in appetite 0 - 0 0 -  Feeling bad or failure about yourself  0 - 0 0 -  Trouble  concentrating 0 - 0 0 -  Moving slowly or fidgety/restless 0 - 0 0 -  Suicidal thoughts 0 - 0 0 -  PHQ-9 Score 3 - 0 2 -  Difficult doing work/chores Not difficult at all - Not difficult at all Not difficult at all -   .. 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 -     lisdexamfetamine (VYVANSE) 40 MG capsule; Take 1 capsule (40 mg total) by mouth every morning. -     lisdexamfetamine (VYVANSE) 40 MG capsule; Take 1 capsule (40 mg total) by mouth every morning.  Anxiety -     busPIRone (BUSPAR) 5 MG tablet; Take 1 tablet (5 mg total) by mouth 3 (three) times daily.  Reactive depression   Refilled ADHD medication.  Continue wellbutrin.  Added buspar.  Use clonazepam only as needed.  Follow up in 6 weeks.    Follow Up Instructions:    I discussed the assessment and treatment plan with the patient. The patient was provided an opportunity to ask questions and all were answered. The patient agreed with the plan and demonstrated an understanding of the instructions.   The patient was advised to call back or seek an in-person evaluation if the symptoms worsen or if the condition fails to improve as anticipated.  I provided 10 minutes of non-face-to-face time during this encounter.   Tandy Gaw, PA-C

## 2019-05-22 NOTE — Progress Notes (Signed)
PHQ9-GAD7 completed.  Wants to discuss anxiety Needs refills

## 2019-05-25 ENCOUNTER — Encounter: Payer: Self-pay | Admitting: Physician Assistant

## 2019-06-24 ENCOUNTER — Other Ambulatory Visit: Payer: Self-pay | Admitting: Physician Assistant

## 2019-06-24 DIAGNOSIS — R1012 Left upper quadrant pain: Secondary | ICD-10-CM

## 2019-06-24 DIAGNOSIS — F329 Major depressive disorder, single episode, unspecified: Secondary | ICD-10-CM

## 2019-06-24 DIAGNOSIS — F988 Other specified behavioral and emotional disorders with onset usually occurring in childhood and adolescence: Secondary | ICD-10-CM

## 2019-06-24 DIAGNOSIS — F419 Anxiety disorder, unspecified: Secondary | ICD-10-CM

## 2019-06-26 ENCOUNTER — Other Ambulatory Visit: Payer: Self-pay | Admitting: Physician Assistant

## 2019-06-26 DIAGNOSIS — F419 Anxiety disorder, unspecified: Secondary | ICD-10-CM

## 2019-06-26 DIAGNOSIS — F329 Major depressive disorder, single episode, unspecified: Secondary | ICD-10-CM

## 2019-06-26 DIAGNOSIS — R1012 Left upper quadrant pain: Secondary | ICD-10-CM

## 2019-06-26 DIAGNOSIS — F988 Other specified behavioral and emotional disorders with onset usually occurring in childhood and adolescence: Secondary | ICD-10-CM

## 2019-07-21 ENCOUNTER — Telehealth: Payer: Self-pay | Admitting: Physician Assistant

## 2019-07-21 NOTE — Telephone Encounter (Signed)
PT called in asking for a refill on her Vyvanse. No changes to dose or Pharmacy.  °

## 2019-07-21 NOTE — Telephone Encounter (Signed)
This was sent to the pharmacy yesterday. Called patient and made her aware. Will need a visit in the next month for refills.

## 2019-09-09 ENCOUNTER — Other Ambulatory Visit: Payer: Self-pay

## 2019-09-09 ENCOUNTER — Encounter: Payer: Self-pay | Admitting: Physician Assistant

## 2019-09-09 ENCOUNTER — Ambulatory Visit (INDEPENDENT_AMBULATORY_CARE_PROVIDER_SITE_OTHER): Payer: PRIVATE HEALTH INSURANCE | Admitting: Physician Assistant

## 2019-09-09 VITALS — BP 124/75 | HR 112 | Ht 68.0 in | Wt 129.0 lb

## 2019-09-09 DIAGNOSIS — K582 Mixed irritable bowel syndrome: Secondary | ICD-10-CM | POA: Diagnosis not present

## 2019-09-09 DIAGNOSIS — Z716 Tobacco abuse counseling: Secondary | ICD-10-CM

## 2019-09-09 DIAGNOSIS — F988 Other specified behavioral and emotional disorders with onset usually occurring in childhood and adolescence: Secondary | ICD-10-CM

## 2019-09-09 DIAGNOSIS — F341 Dysthymic disorder: Secondary | ICD-10-CM | POA: Diagnosis not present

## 2019-09-09 DIAGNOSIS — F419 Anxiety disorder, unspecified: Secondary | ICD-10-CM

## 2019-09-09 MED ORDER — VARENICLINE TARTRATE 1 MG PO TABS: 1.0000 mg | ORAL_TABLET | Freq: Two times a day (BID) | ORAL | 2 refills | Status: DC

## 2019-09-09 MED ORDER — DICYCLOMINE HCL 10 MG PO CAPS: ORAL_CAPSULE | ORAL | 5 refills | Status: DC

## 2019-09-09 MED ORDER — CHANTIX STARTING MONTH PAK 0.5 MG X 11 & 1 MG X 42 PO TABS: ORAL_TABLET | ORAL | 0 refills | Status: DC

## 2019-09-09 MED ORDER — LISDEXAMFETAMINE DIMESYLATE 40 MG PO CAPS: 40.0000 mg | ORAL_CAPSULE | ORAL | 0 refills | Status: DC

## 2019-09-09 MED ORDER — BUPROPION HCL ER (XL) 300 MG PO TB24: 300.0000 mg | ORAL_TABLET | Freq: Every day | ORAL | 1 refills | Status: DC

## 2019-09-09 NOTE — Progress Notes (Signed)
Subjective:    Patient ID: Casey Sweeney, female    DOB: Nov 05, 1980, 39 y.o.   MRN: [id number]  HPI  Patient is a 39 year old female with ADHD, dysthymia, IBS who presents to the clinic for medication refills.  Patient is doing really well on Vyvanse.  She has no concerns or complaints.  She continues to sleep.  She occasionally does have an elevated heart rate.  Usually comes down with rest and do stress techniques.  She denies any chest pain, headaches, dizziness.  She is doing much better at work.  She is working full-time.  She reports that her mood is stable and denies any suicidal thoughts or homicidal idealizations  Her IBS is well controlled with Bentyl twice a day.  She can take it up to 4 times a day if needed.  It has been very helpful in her IBS symptoms.   Reports mother has MDS and she wants to get healthier. She wants to stop smoking. She feels like she is ready.  Patient has used Chantix before and almost stopped smoking but she stopped due to some family stress.    .. Active Ambulatory Problems    Diagnosis Date Noted  . Encounter for IUD insertion 02/04/2012  . Anxiety 03/26/2012  . Reactive depression 03/26/2012  . Patellofemoral syndrome of left knee 09/16/2012  . Right shoulder pain 07/28/2013  . Cubital tunnel syndrome on right 07/28/2013  . Family hx-breast malignancy 04/06/2014  . Breast tenderness in female 04/06/2014  . Fullness of breast 04/06/2014  . DDD (degenerative disc disease), cervical 03/22/2015  . Currently attempting to quit smoking 03/30/2015  . Generalized abdominal pain 03/30/2015  . Capsulitis of metatarsophalangeal (MTP) joint of left foot 02/01/2016  . Onychomycosis 02/17/2016  . Stress at home 04/19/2016  . Irritable bowel syndrome with both constipation and diarrhea 05/25/2016  . GAD (generalized anxiety disorder) 12/17/2016  . Inattention 12/17/2016  . History of attention deficit hyperactivity disorder (ADHD) 12/18/2016  . ADD  (attention deficit disorder) 02/20/2017  . Obsessive behaviors 02/20/2017  . Obsessive compulsive disorder 02/22/2017  . Recurrent sinusitis 04/24/2017  . Right sided facial pain 04/24/2017  . Mastoid pain, right 04/24/2017  . Chronic maxillary sinusitis 04/24/2017  . Tachycardia 04/25/2017  . Weight loss 11/18/2017  . Arthralgia of left hand 05/07/2018  . Right elbow pain 05/07/2018  . Primary osteoarthritis of both hands 09/03/2018  . No energy 09/03/2018  . Stress 02/04/2019  . Elevated BP without diagnosis of hypertension 02/04/2019   Resolved Ambulatory Problems    Diagnosis Date Noted  . PHARYNGITIS, STREPTOCOCCAL 01/03/2010  . Acute maxillary sinusitis 04/02/2009  . NAUSEA 02/09/2008  . FLANK PAIN, LEFT 02/09/2008  . Acute sinusitis, unspecified 10/19/2010  . Ingrown left big toenail 07/28/2013  . Body aches 04/06/2014  . Viral pharyngitis 09/20/2014  . Constipation 03/30/2015  . Influenza A 06/06/2015  . Stress at work 04/19/2016   Past Medical History:  Diagnosis Date  . Depression   . Headache(784.0)   . History of recurrent UTIs   . Irritable bowel syndrome   . Kidney stones      Review of Systems  All other systems reviewed and are negative.      Objective:   Physical Exam Vitals reviewed.  Constitutional:      Appearance: Normal appearance.  HENT:     Head: Normocephalic.  Cardiovascular:     Rate and Rhythm: Normal rate and regular rhythm.     Pulses: Normal pulses.  Pulmonary:     Effort: Pulmonary effort is normal.  Neurological:     General: No focal deficit present.     Mental Status: She is alert and oriented to person, place, and time.  Psychiatric:        Mood and Affect: Mood normal.   .. Depression screen [facility] 2/9 09/09/2019 05/22/2019 02/04/2019 01/29/2019 09/02/2018  Decreased Interest 0 0 0 0 0  Down, Depressed, Hopeless 0 0 0 0 0  PHQ - 2 Score 0 0 0 0 0  Altered sleeping 0 0 - 0 0  Tired, decreased energy 0 3 - 0 2  Change  in appetite 0 0 - 0 0  Feeling bad or failure about yourself  0 0 - 0 0  Trouble concentrating 0 0 - 0 0  Moving slowly or fidgety/restless 0 0 - 0 0  Suicidal thoughts 0 0 - 0 0  PHQ-9 Score 0 3 - 0 2  Difficult doing work/chores Not difficult at all Not difficult at all - Not difficult at all Not difficult at all  Some recent data might be hidden   .Marland Kitchen GAD 7 : Generalized Anxiety Score 09/09/2019 05/22/2019 02/04/2019 01/29/2019  Nervous, Anxious, on Edge 0 3 2 0  Control/stop worrying 0 0 0 0  Worry too much - different things 0 0 1 0  Trouble relaxing 0 3 3 0  Restless 0 1 0 0  Easily annoyed or irritable 0 1 3 0  Afraid - awful might happen 0 0 0 0  Total GAD 7 Score 0 8 9 0  Anxiety Difficulty Not difficult at all Not difficult at all Not difficult at all Not difficult at all            Assessment & Plan:  Marland KitchenMarland KitchenKana was seen today for annual exam.  Diagnoses and all orders for this visit:  Attention deficit disorder (ADD) without hyperactivity -     lisdexamfetamine (VYVANSE) 40 MG capsule; Take 1 capsule (40 mg total) by mouth every morning. -     lisdexamfetamine (VYVANSE) 40 MG capsule; Take 1 capsule (40 mg total) by mouth every morning. -     lisdexamfetamine (VYVANSE) 40 MG capsule; Take 1 capsule (40 mg total) by mouth every morning. -     buPROPion (WELLBUTRIN XL) 300 MG 24 hr tablet; Take 1 tablet (300 mg total) by mouth daily.  Irritable bowel syndrome with both constipation and diarrhea -     dicyclomine (BENTYL) 10 MG capsule; Take 1 capsule by mouth 4 times daily  Anxiety -     buPROPion (WELLBUTRIN XL) 300 MG 24 hr tablet; Take 1 tablet (300 mg total) by mouth daily.  Dysthymia -     buPROPion (WELLBUTRIN XL) 300 MG 24 hr tablet; Take 1 tablet (300 mg total) by mouth daily.  Encounter for tobacco use cessation counseling -     varenicline (CHANTIX STARTING MONTH PAK) 0.5 MG X 11 & 1 MG X 42 tablet; Take one 0.5mg  tablet by mouth once daily for 3 days,  then increase to one 0.5mg  tablet twice daily for 3 days, then increase to one 1mg  tablet twice daily. -     varenicline (CHANTIX) 1 MG tablet; Take 1 tablet (1 mg total) by mouth 2 (two) times daily.   Refill Vyvanse for 3 months. Refilled Wellbutrin and Bentyl.  Discussed smoking cessation and cravings.  Restarted Chantix.  Patient aware of side effects.  Added mother's diagnosis of MDS to history  was.  Patient is not due for labs.  We will recheck labs likely at next visit.  Follow-up 3 months.

## 2019-10-20 ENCOUNTER — Encounter (INDEPENDENT_AMBULATORY_CARE_PROVIDER_SITE_OTHER): Payer: PRIVATE HEALTH INSURANCE | Admitting: Physician Assistant

## 2019-10-20 DIAGNOSIS — R110 Nausea: Secondary | ICD-10-CM

## 2019-10-20 MED ORDER — ONDANSETRON 8 MG PO TBDP
8.0000 mg | ORAL_TABLET | Freq: Three times a day (TID) | ORAL | 3 refills | Status: DC | PRN
Start: 2019-10-20 — End: 2020-12-14

## 2019-11-02 ENCOUNTER — Encounter: Payer: Self-pay | Admitting: Neurology

## 2019-11-03 NOTE — Telephone Encounter (Signed)
Please advise 

## 2019-12-19 ENCOUNTER — Other Ambulatory Visit: Payer: Self-pay | Admitting: Physician Assistant

## 2019-12-19 DIAGNOSIS — F988 Other specified behavioral and emotional disorders with onset usually occurring in childhood and adolescence: Secondary | ICD-10-CM

## 2019-12-21 NOTE — Telephone Encounter (Signed)
Routing to covering provider.  °

## 2019-12-21 NOTE — Telephone Encounter (Signed)
Notes from last OV say to return in 3 months which would have been Sept. Pt neesd appt with [name]

## 2019-12-22 NOTE — Telephone Encounter (Signed)
Pt has been updated of covering provider's recommendation. Pt was unable to schedule an appointment because she was at work. She will return a call back to the clinic for scheduling.

## 2019-12-30 ENCOUNTER — Telehealth (INDEPENDENT_AMBULATORY_CARE_PROVIDER_SITE_OTHER): Payer: PRIVATE HEALTH INSURANCE | Admitting: Physician Assistant

## 2019-12-30 ENCOUNTER — Encounter: Payer: Self-pay | Admitting: Physician Assistant

## 2019-12-30 VITALS — Temp 97.8°F | Ht 68.0 in | Wt 129.0 lb

## 2019-12-30 DIAGNOSIS — F988 Other specified behavioral and emotional disorders with onset usually occurring in childhood and adolescence: Secondary | ICD-10-CM | POA: Diagnosis not present

## 2019-12-30 DIAGNOSIS — M25511 Pain in right shoulder: Secondary | ICD-10-CM

## 2019-12-30 DIAGNOSIS — F411 Generalized anxiety disorder: Secondary | ICD-10-CM

## 2019-12-30 DIAGNOSIS — F422 Mixed obsessional thoughts and acts: Secondary | ICD-10-CM

## 2019-12-30 DIAGNOSIS — F329 Major depressive disorder, single episode, unspecified: Secondary | ICD-10-CM | POA: Diagnosis not present

## 2019-12-30 DIAGNOSIS — G8929 Other chronic pain: Secondary | ICD-10-CM

## 2019-12-30 MED ORDER — LISDEXAMFETAMINE DIMESYLATE 40 MG PO CAPS: 40.0000 mg | ORAL_CAPSULE | ORAL | 0 refills | Status: DC

## 2019-12-30 NOTE — Progress Notes (Signed)
Needs refills of Vyvanse Doing well with mood Having some issues with right shoulder and both hands (had surgery on shoulder a year ago and still having pain, using tylenol occasionally, job makes it worse)

## 2020-01-01 NOTE — Progress Notes (Signed)
Patient ID: Casey Sweeney, female   DOB: May 13, 1980, 39 y.o.   MRN: [id number] .Marland KitchenVirtual Visit via Video Note  I connected with Casey Sweeney on 12/30/2019 at  7:50 AM EDT by a video enabled telemedicine application and verified that I am speaking with the correct person using two identifiers.  Location: Patient: work Provider: clinic   I discussed the limitations of evaluation and management by telemedicine and the availability of in person appointments. The patient expressed understanding and agreed to proceed.  History of Present Illness: Patient is a 39 year old female with ADD, GAD, MDD, right shoulder pain who presents to the clinic for medication refills.  Patient is overall doing well on Vyvanse and Wellbutrin. She has no major concerns or complaints. She denies any suicidal thoughts or homicidal idealizations. She does have quite a bit of stress at home. Her mother is very ill and her 22 year old daughter just moved out to live with her dad. Overall she seems to be handling this very well. She denies any problems sleeping, palpitations, headaches.  She was thinking about stopping smoking but Chantix recalled and she never started it. She continues to smoke.  Patient has ongoing right shoulder pain with movement. She does have a fairly active job. She actually had surgery to remove bone spurs on the shoulder. She wonders if they are coming back. She is not doing anything to make better.    Observations/Objective: No acute distress Normal mood and appearance.  NROM right shoulder pain with ROM and abduction.   .. Today's Vitals   12/30/19 0754  Temp: 97.8 F (36.6 C)  TempSrc: Oral  Weight: 129 lb (58.5 kg)  Height: 5\' 8"  (1.727 m)   Body mass index is 19.61 kg/m.  .. Depression screen [facility] 2/9 12/30/2019 09/09/2019 05/22/2019 02/04/2019 01/29/2019  Decreased Interest 0 0 0 0 0  Down, Depressed, Hopeless 0 0 0 0 0  PHQ - 2 Score 0 0 0 0 0  Altered sleeping - 0 0 - 0   Tired, decreased energy - 0 3 - 0  Change in appetite - 0 0 - 0  Feeling bad or failure about yourself  - 0 0 - 0  Trouble concentrating - 0 0 - 0  Moving slowly or fidgety/restless - 0 0 - 0  Suicidal thoughts - 0 0 - 0  PHQ-9 Score - 0 3 - 0  Difficult doing work/chores - Not difficult at all Not difficult at all - Not difficult at all  Some recent data might be hidden   .01/31/2019 GAD 7 : Generalized Anxiety Score 09/09/2019 05/22/2019 02/04/2019 01/29/2019  Nervous, Anxious, on Edge 0 3 2 0  Control/stop worrying 0 0 0 0  Worry too much - different things 0 0 1 0  Trouble relaxing 0 3 3 0  Restless 0 1 0 0  Easily annoyed or irritable 0 1 3 0  Afraid - awful might happen 0 0 0 0  Total GAD 7 Score 0 8 9 0  Anxiety Difficulty Not difficult at all Not difficult at all Not difficult at all Not difficult at all      Assessment and Plan: 11/21/2020Marland KitchenTennille was seen today for adhd.  Diagnoses and all orders for this visit:  Chronic right shoulder pain  Attention deficit disorder (ADD) without hyperactivity -     lisdexamfetamine (VYVANSE) 40 MG capsule; Take 1 capsule (40 mg total) by mouth every morning. -     lisdexamfetamine (VYVANSE) 40 MG capsule;  Take 1 capsule (40 mg total) by mouth every morning. -     lisdexamfetamine (VYVANSE) 40 MG capsule; Take 1 capsule (40 mg total) by mouth every morning.  GAD (generalized anxiety disorder)  Reactive depression  Mixed obsessional thoughts and acts   Refill Vyvanse for 3 months. Patient has refills of Wellbutrin. PHQ and GAD numbers are stable. Patient does have some stressors at home with her sick mother and daughter moving out. She seems to be handling this very well. Follow-up as needed.  Patient does have some chronic right shoulder pain. Does sound like some impingement syndrome. Discussed exercises to do. She has had surgery on that shoulder before. NSAIDs as needed. Avoid overuse. Follow-up with Dr. Karie Schwalbe if symptoms persist and would like  to consider x-ray and injections.   Follow Up Instructions:    I discussed the assessment and treatment plan with the patient. The patient was provided an opportunity to ask questions and all were answered. The patient agreed with the plan and demonstrated an understanding of the instructions.   The patient was advised to call back or seek an in-person evaluation if the symptoms worsen or if the condition fails to improve as anticipated.  I provided 25 minutes of non-face-to-face time during this encounter.   Tandy Gaw, PA-C

## 2020-03-23 IMAGING — MR MR CERVICAL SPINE W/O CM
5 series · 33 of 48 positions shown · non-contrast
Comparison: MRI dated 02/13/2016 and radiographs dated 03/22/2015

CLINICAL DATA: Neck pain and left shoulder and arm pain for 2
months.

EXAM:
MRI CERVICAL SPINE WITHOUT CONTRAST
TECHNIQUE: Multiplanar, multisequence MR imaging of the cervical spine was
performed. No intravenous contrast was administered.

[Series 2: T2 · sagittal · 3.0mm · 0.56mm/px · 6 of 13 slices shown (1 of 2)]
[im 1/13]
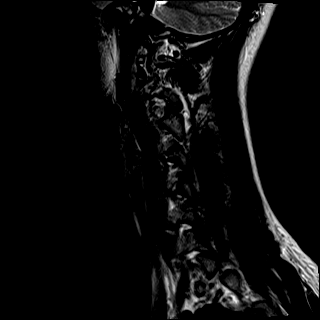
[im 3/13]
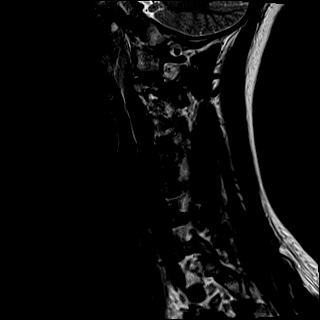
[im 5/13]
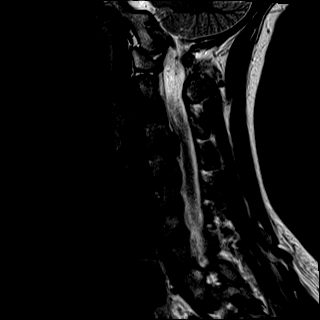
[im 8/13]
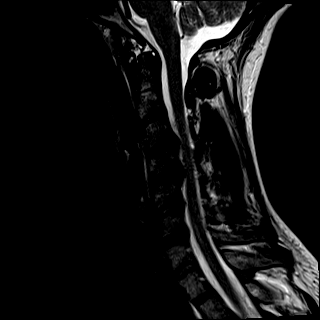
[im 10/13]
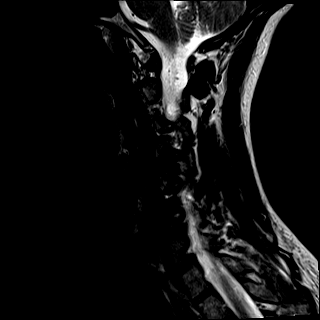
[im 13/13]
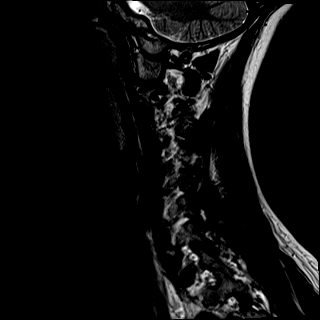

[Series 3: T1 · sagittal · 3.0mm · 0.70mm/px · 7 of 13 slices shown]
[im 1/13]
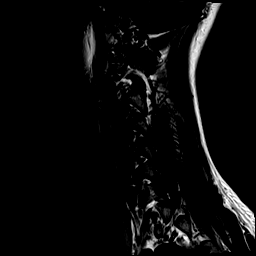
[im 3/13]
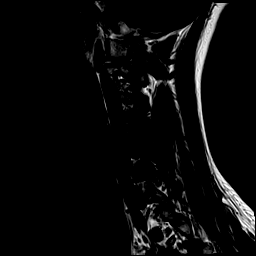
[im 5/13]
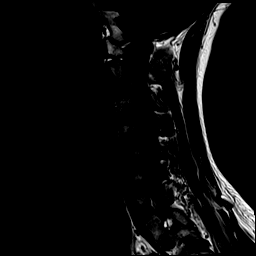
[im 7/13]
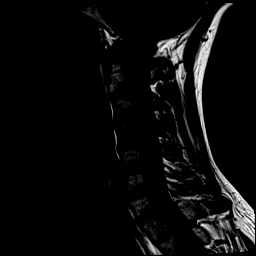
[im 9/13]
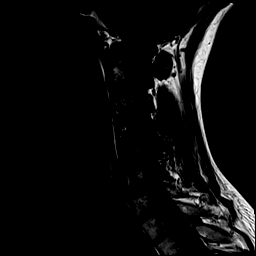
[im 11/13]
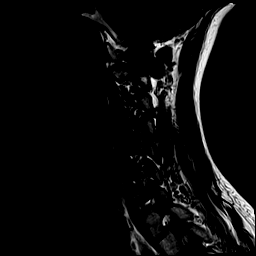
[im 13/13]
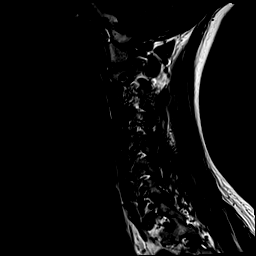

[Series 4: STIR · sagittal · 3.0mm · 0.35mm/px · 7 of 13 slices shown]
[im 1/13]
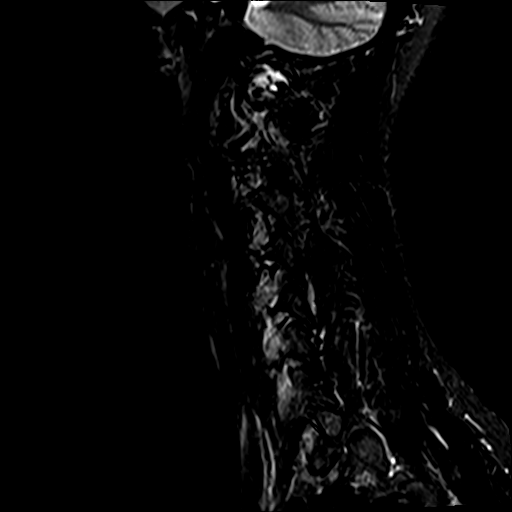
[im 3/13]
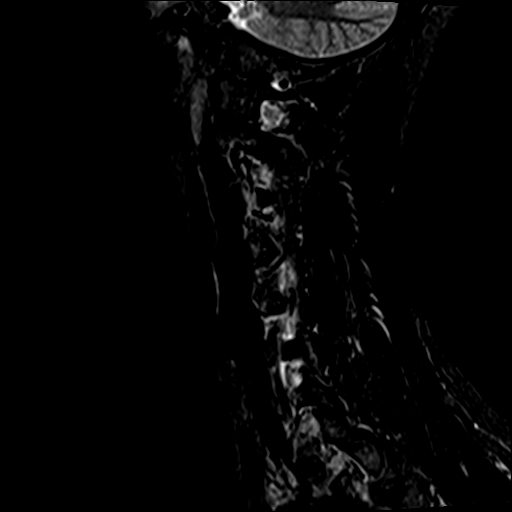
[im 5/13]
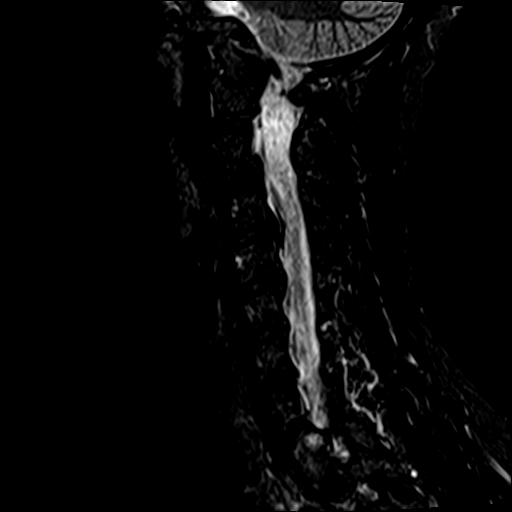
[im 7/13]
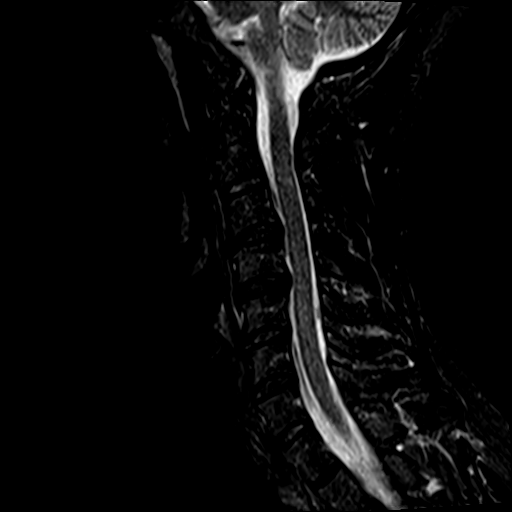
[im 9/13]
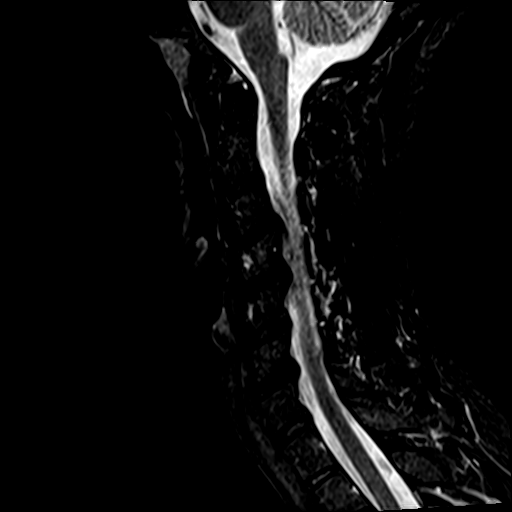
[im 11/13]
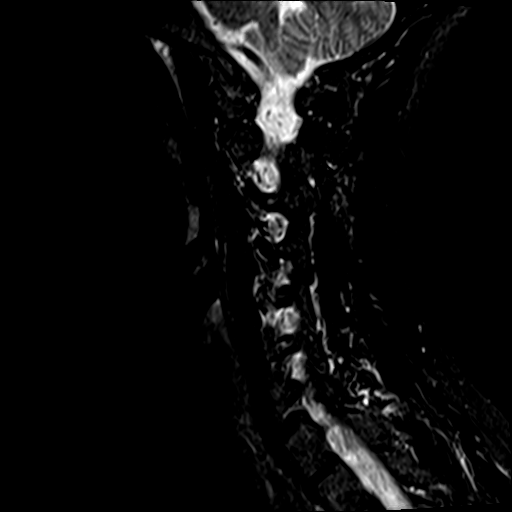
[im 13/13]
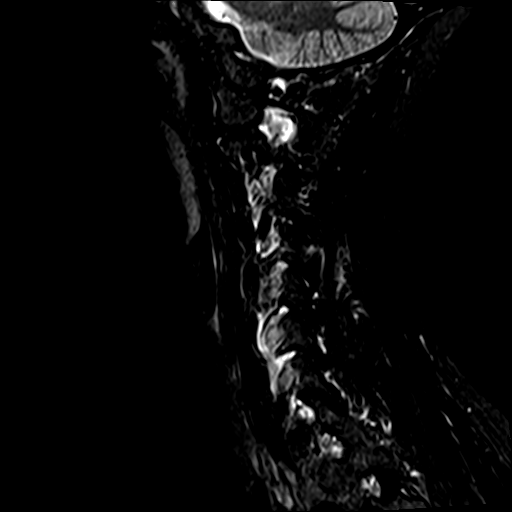

[Series 5: T2 · axial · 3.0mm · 0.62mm/px · z∈[-66,+31]mm · 8 of 27 slices shown (2 of 2)]
[im 1/27]
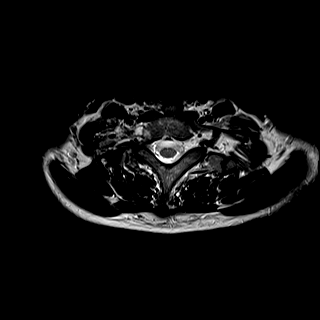
[im 5/27]
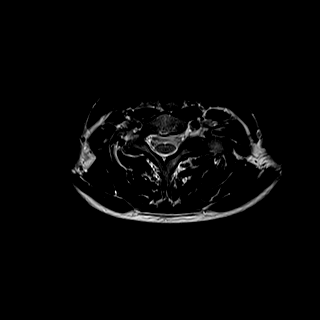
[im 9/27]
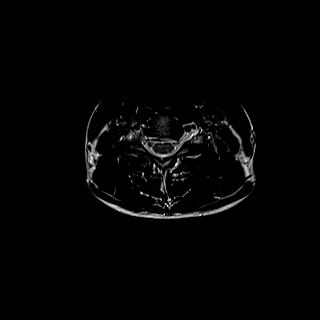
[im 13/27]
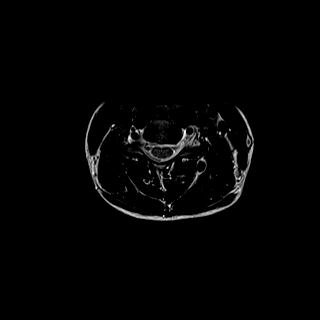
[im 15/27]
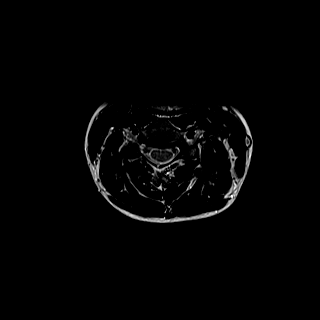
[im 19/27]
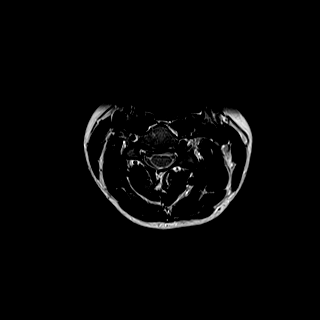
[im 23/27]
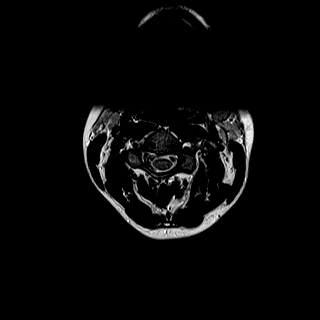
[im 27/27]
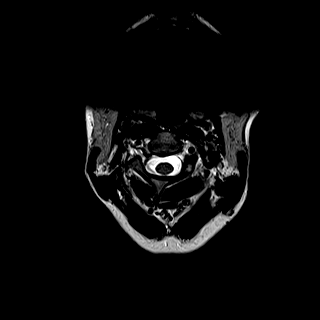

[Series 6: mpgr ax · axial · 3.0mm · 0.35mm/px · z∈[-55,-4]mm · 5 of 27 slices shown]
[im 1/27]
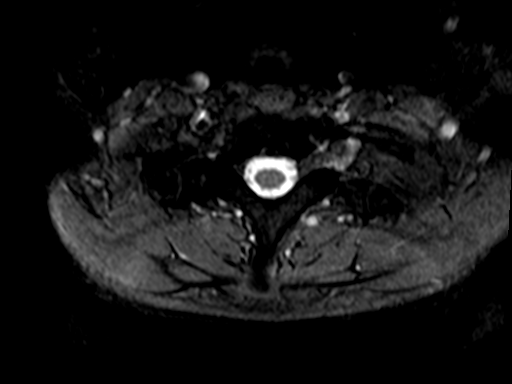
[im 5/27]
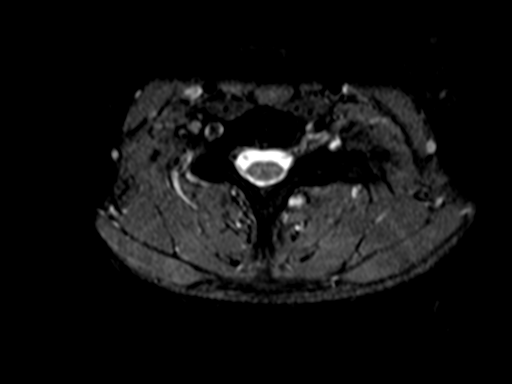
[im 9/27]
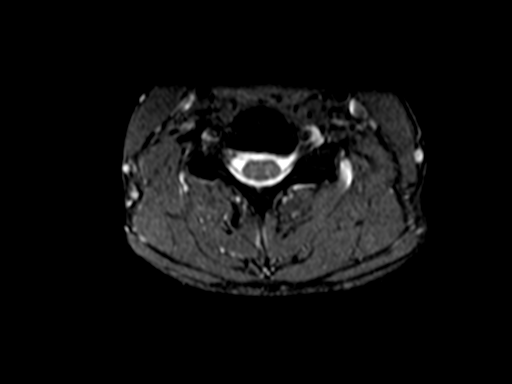
[im 13/27]
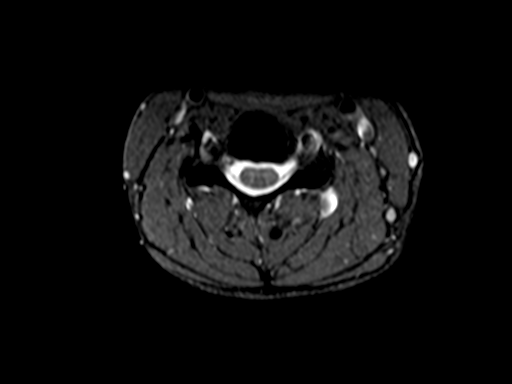
[im 15/27]
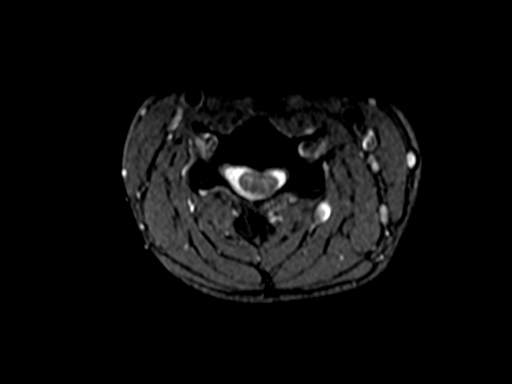

[33 of 48 positions shown; findings below may reference images not displayed]

FINDINGS: Alignment: Alignment is normal. Straightening of the upper cervical
lordosis, unchanged.

Vertebrae: Small hemangioma in the right pedicle of T1, unchanged.
Otherwise negative. No facet arthritis in the cervical spine.

Cord: Normal signal and morphology.

Posterior Fossa, vertebral arteries, paraspinal tissues: Negative.

Disc levels:

C2-3: Normal.

C3-4: Small disc osteophyte complexes extend to the right and left
of midline without focal neural impingement. These are slightly more
prominent on the right than on the prior study. No foraminal
stenosis.

C4-5: Leftward disc osteophyte complex contacts the ventral aspect
of the left side of the spinal cord, minimally more prominent than
on the prior study. Minimal narrowing of the proximal left neural
foramen, unchanged.

C5-6: Negative.

C6-7: Tiny disc osteophyte complex slightly to the left of midline
without neural impingement, unchanged. No foraminal stenosis.

C7-T1: Negative.
IMPRESSION: 1. Chronic degenerative disc disease primarily at C3-4 and C4-5,
slightly progressed to the right of midline at C3-4 and to the left
of midline at C4-5.
2. The only focal area of possible neural impingement is at C4-5
where the disc osteophyte complex touches the ventral aspect of the
spinal cord and minimally narrows the left lateral recess and
proximal left neural foramen.

## 2020-04-12 ENCOUNTER — Other Ambulatory Visit: Payer: Self-pay | Admitting: Neurology

## 2020-04-12 ENCOUNTER — Other Ambulatory Visit: Payer: Self-pay | Admitting: Physician Assistant

## 2020-04-12 DIAGNOSIS — F419 Anxiety disorder, unspecified: Secondary | ICD-10-CM

## 2020-04-12 DIAGNOSIS — F988 Other specified behavioral and emotional disorders with onset usually occurring in childhood and adolescence: Secondary | ICD-10-CM

## 2020-04-12 DIAGNOSIS — F341 Dysthymic disorder: Secondary | ICD-10-CM

## 2020-04-12 NOTE — Telephone Encounter (Signed)
Patient left vm stating she needs refills before next week when she has an appointment scheduled.   Called back and left vm for patient to let us know which medications she needs.

## 2020-04-14 NOTE — Telephone Encounter (Signed)
Vyvanse - last written 02/27/2020 for one month.  She has appt next week, but completely out. Please advise on refill.

## 2020-04-15 MED ORDER — LISDEXAMFETAMINE DIMESYLATE 40 MG PO CAPS: 40.0000 mg | ORAL_CAPSULE | ORAL | 0 refills | Status: DC

## 2020-04-18 ENCOUNTER — Encounter: Payer: Self-pay | Admitting: Physician Assistant

## 2020-04-18 ENCOUNTER — Telehealth (INDEPENDENT_AMBULATORY_CARE_PROVIDER_SITE_OTHER): Payer: PRIVATE HEALTH INSURANCE | Admitting: Physician Assistant

## 2020-04-18 VITALS — Temp 97.6°F | Ht 68.0 in | Wt 145.0 lb

## 2020-04-18 DIAGNOSIS — Z1159 Encounter for screening for other viral diseases: Secondary | ICD-10-CM

## 2020-04-18 DIAGNOSIS — Z0000 Encounter for general adult medical examination without abnormal findings: Secondary | ICD-10-CM | POA: Diagnosis not present

## 2020-04-18 DIAGNOSIS — F988 Other specified behavioral and emotional disorders with onset usually occurring in childhood and adolescence: Secondary | ICD-10-CM | POA: Diagnosis not present

## 2020-04-18 DIAGNOSIS — R030 Elevated blood-pressure reading, without diagnosis of hypertension: Secondary | ICD-10-CM

## 2020-04-18 DIAGNOSIS — Z13220 Encounter for screening for lipoid disorders: Secondary | ICD-10-CM | POA: Diagnosis not present

## 2020-04-18 DIAGNOSIS — Z1329 Encounter for screening for other suspected endocrine disorder: Secondary | ICD-10-CM

## 2020-04-18 NOTE — Progress Notes (Signed)
Follow up - no issues Just received refill on Vyvanse 04/15/2020, pended future refills PHQ2 - 0

## 2020-04-18 NOTE — Progress Notes (Signed)
Patient ID: Casey Sweeney, female   DOB: [birth year], 40 y.o.   MRN: [id number] .Marland KitchenVirtual Visit via Video Note  I connected with Casey Sweeney on 04/19/20 at  9:50 AM EST by a video enabled telemedicine application and verified that I am speaking with the correct person using two identifiers.  Location: Patient: home Provider: clinic  .Marland KitchenParticipating in visit:  Patient: Casey Sweeney Provider: Tandy Gaw PA-C   I discussed the limitations of evaluation and management by telemedicine and the availability of in person appointments. The patient expressed understanding and agreed to proceed.  History of Present Illness: Pt is a 40 yo female with ADD who presents to clinic via mychart to discuss refills. She is doing well. No significant mood changes. She has gotten a promotion at work and staying busy. She did stop smoking in September.   . Active Ambulatory Problems    Diagnosis Date Noted  . Encounter for IUD insertion 02/04/2012  . Anxiety 03/26/2012  . Reactive depression 03/26/2012  . Patellofemoral syndrome of left knee 09/16/2012  . Chronic right shoulder pain 07/28/2013  . Cubital tunnel syndrome on right 07/28/2013  . Family hx-breast malignancy 04/06/2014  . Breast tenderness in female 04/06/2014  . Fullness of breast 04/06/2014  . DDD (degenerative disc disease), cervical 03/22/2015  . Currently attempting to quit smoking 03/30/2015  . Generalized abdominal pain 03/30/2015  . Capsulitis of metatarsophalangeal (MTP) joint of left foot 02/01/2016  . Onychomycosis 02/17/2016  . Stress at home 04/19/2016  . Irritable bowel syndrome with both constipation and diarrhea 05/25/2016  . GAD (generalized anxiety disorder) 12/17/2016  . Inattention 12/17/2016  . History of attention deficit hyperactivity disorder (ADHD) 12/18/2016  . ADD (attention deficit disorder) 02/20/2017  . Obsessive behaviors 02/20/2017  . Obsessive compulsive disorder 02/22/2017  . Recurrent sinusitis  04/24/2017  . Right sided facial pain 04/24/2017  . Mastoid pain, right 04/24/2017  . Chronic maxillary sinusitis 04/24/2017  . Tachycardia 04/25/2017  . Weight loss 11/18/2017  . Arthralgia of left hand 05/07/2018  . Right elbow pain 05/07/2018  . Primary osteoarthritis of both hands 09/03/2018  . No energy 09/03/2018  . Stress 02/04/2019  . Elevated BP without diagnosis of hypertension 02/04/2019   Resolved Ambulatory Problems    Diagnosis Date Noted  . PHARYNGITIS, STREPTOCOCCAL 01/03/2010  . Acute maxillary sinusitis 04/02/2009  . NAUSEA 02/09/2008  . FLANK PAIN, LEFT 02/09/2008  . Acute sinusitis, unspecified 10/19/2010  . Ingrown left big toenail 07/28/2013  . Body aches 04/06/2014  . Viral pharyngitis 09/20/2014  . Constipation 03/30/2015  . Influenza A 06/06/2015  . Stress at work 04/19/2016   Past Medical History:  Diagnosis Date  . Depression   . Headache(784.0)   . History of recurrent UTIs   . Irritable bowel syndrome   . Kidney stones    Reviewed med, allergy, problem list.    Observations/Objective: No acute distress Normal mood and appearance No labored breathing or cough.   .. Today's Vitals   04/18/20 0958  Temp: 97.6 F (36.4 C)  TempSrc: Oral  Weight: 145 lb (65.8 kg)  Height: 5\' 8"  (1.727 m)   Body mass index is 22.05 kg/m.    Assessment and Plan: Marland KitchenCody was seen today for adhd.  Diagnoses and all orders for this visit:  Routine physical examination -     CBC with Differential/Platelet -     COMPLETE METABOLIC PANEL WITH GFR -     Lipid Panel w/reflex Direct LDL -  TSH  Attention deficit disorder (ADD) without hyperactivity -     lisdexamfetamine (VYVANSE) 40 MG capsule; Take 1 capsule (40 mg total) by mouth every morning. -     lisdexamfetamine (VYVANSE) 40 MG capsule; Take 1 capsule (40 mg total) by mouth every morning. -     lisdexamfetamine (VYVANSE) 40 MG capsule; Take 1 capsule (40 mg total) by mouth every  morning.  Elevated BP without diagnosis of hypertension -     COMPLETE METABOLIC PANEL WITH GFR  Lipid screening -     Lipid Panel w/reflex Direct LDL  Thyroid disorder screen -     TSH  Encounter for hepatitis C screening test for low risk patient -     Hepatitis C Antibody   Fasting labs ordered. Please come by to have drawn.  Refilled vyvanse for 3 months ok for 1 refill.   Follow up in 6 months.     Follow Up Instructions:    I discussed the assessment and treatment plan with the patient. The patient was provided an opportunity to ask questions and all were answered. The patient agreed with the plan and demonstrated an understanding of the instructions.   The patient was advised to call back or seek an in-person evaluation if the symptoms worsen or if the condition fails to improve as anticipated.    Tandy Gaw, PA-C

## 2020-04-19 MED ORDER — LISDEXAMFETAMINE DIMESYLATE 40 MG PO CAPS: 40.0000 mg | ORAL_CAPSULE | ORAL | 0 refills | Status: DC

## 2020-05-23 ENCOUNTER — Ambulatory Visit: Payer: PRIVATE HEALTH INSURANCE | Admitting: Sports Medicine

## 2020-05-23 ENCOUNTER — Other Ambulatory Visit: Payer: Self-pay

## 2020-05-23 ENCOUNTER — Other Ambulatory Visit: Payer: Self-pay | Admitting: Neurology

## 2020-05-23 ENCOUNTER — Ambulatory Visit (INDEPENDENT_AMBULATORY_CARE_PROVIDER_SITE_OTHER): Payer: PRIVATE HEALTH INSURANCE

## 2020-05-23 DIAGNOSIS — M25532 Pain in left wrist: Secondary | ICD-10-CM | POA: Diagnosis not present

## 2020-05-23 DIAGNOSIS — F419 Anxiety disorder, unspecified: Secondary | ICD-10-CM

## 2020-05-23 DIAGNOSIS — F341 Dysthymic disorder: Secondary | ICD-10-CM

## 2020-05-23 DIAGNOSIS — F988 Other specified behavioral and emotional disorders with onset usually occurring in childhood and adolescence: Secondary | ICD-10-CM

## 2020-05-23 MED ORDER — BUPROPION HCL ER (XL) 300 MG PO TB24: 300.0000 mg | ORAL_TABLET | Freq: Every day | ORAL | 1 refills | Status: DC

## 2020-05-23 MED ORDER — MELOXICAM 15 MG PO TABS: ORAL_TABLET | ORAL | 3 refills | Status: DC

## 2020-05-23 NOTE — Progress Notes (Signed)
    Procedures performed today:    Short arm EXOS cast applied on the left.  Independent interpretation of notes and tests performed by another provider:   None.  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 Unfortunately she started to have pain around her radiocarpal joint. This is likely an overuse synovitis. Adding x-rays, immobilization for at least a month, meloxicam. She will minimize her left-handed duties, return to see me in 4 to 6 weeks, MRI if no better.    ___________________________________________ Ihor Austin. Benjamin Stain, M.D., ABFM., CAQSM. Primary Care and Sports Medicine [facility]  Adjunct Instructor of Family Medicine  [employer]

## 2020-05-23 NOTE — Assessment & Plan Note (Signed)
This is a pleasant 40 year old female, she works with her hands doing repetitive motion such as crocheting and knitting. Unfortunately she started to have pain around her radiocarpal joint. This is likely an overuse synovitis. Adding x-rays, immobilization for at least a month, meloxicam. She will minimize her left-handed duties, return to see me in 4 to 6 weeks, MRI if no better.

## 2020-06-06 ENCOUNTER — Ambulatory Visit: Payer: PRIVATE HEALTH INSURANCE | Admitting: Sports Medicine

## 2020-06-06 NOTE — Telephone Encounter (Signed)
Patient scheduled.

## 2020-06-08 ENCOUNTER — Other Ambulatory Visit: Payer: Self-pay

## 2020-06-08 ENCOUNTER — Ambulatory Visit: Payer: PRIVATE HEALTH INSURANCE | Admitting: Sports Medicine

## 2020-06-08 DIAGNOSIS — M25532 Pain in left wrist: Secondary | ICD-10-CM | POA: Diagnosis not present

## 2020-06-08 NOTE — Progress Notes (Signed)
    Procedures performed today:    None.  Independent interpretation of notes and tests performed by another provider:   None.  Brief History, Exam, Impression, and Recommendations:    Left wrist pain Casey Sweeney returns, she is a pleasant 40 year old female, works with her hands doing repetitive motion such as crocheting and knitting. She has pain around the radiocarpal joint, suspected to be an overuse synovitis. X-rays were unrevealing, we placed her in an Exos cast for immobilization, she is here today to get the cast reshaped and we added a undersleeve for comfort. This seems to feel much better. Keep 4 to 6-week follow-up appointment as previously planned and we will do an MRI if no better.    ___________________________________________ Ihor Austin. Benjamin Stain, M.D., ABFM., CAQSM. Primary Care and Sports Medicine [facility]  Adjunct Instructor of Family Medicine  [employer]

## 2020-06-08 NOTE — Assessment & Plan Note (Signed)
Casey Sweeney returns, she is a pleasant 40 year old female, works with her hands doing repetitive motion such as crocheting and knitting. She has pain around the radiocarpal joint, suspected to be an overuse synovitis. X-rays were unrevealing, we placed her in an Exos cast for immobilization, she is here today to get the cast reshaped and we added a undersleeve for comfort. This seems to feel much better. Keep 4 to 6-week follow-up appointment as previously planned and we will do an MRI if no better.

## 2020-06-10 ENCOUNTER — Other Ambulatory Visit: Payer: Self-pay | Admitting: Neurology

## 2020-06-10 DIAGNOSIS — K582 Mixed irritable bowel syndrome: Secondary | ICD-10-CM

## 2020-06-10 MED ORDER — DICYCLOMINE HCL 10 MG PO CAPS: 10.0000 mg | ORAL_CAPSULE | Freq: Four times a day (QID) | ORAL | 1 refills | Status: DC | PRN

## 2020-07-17 IMAGING — DX DG HAND COMPLETE 3+V*L*
3 series · 3 of 3 positions shown · non-contrast
Comparison: None.

CLINICAL DATA: Left index finger pain for 2 weeks.

EXAM:
LEFT HAND - COMPLETE 3+ VIEW

[hand pa]
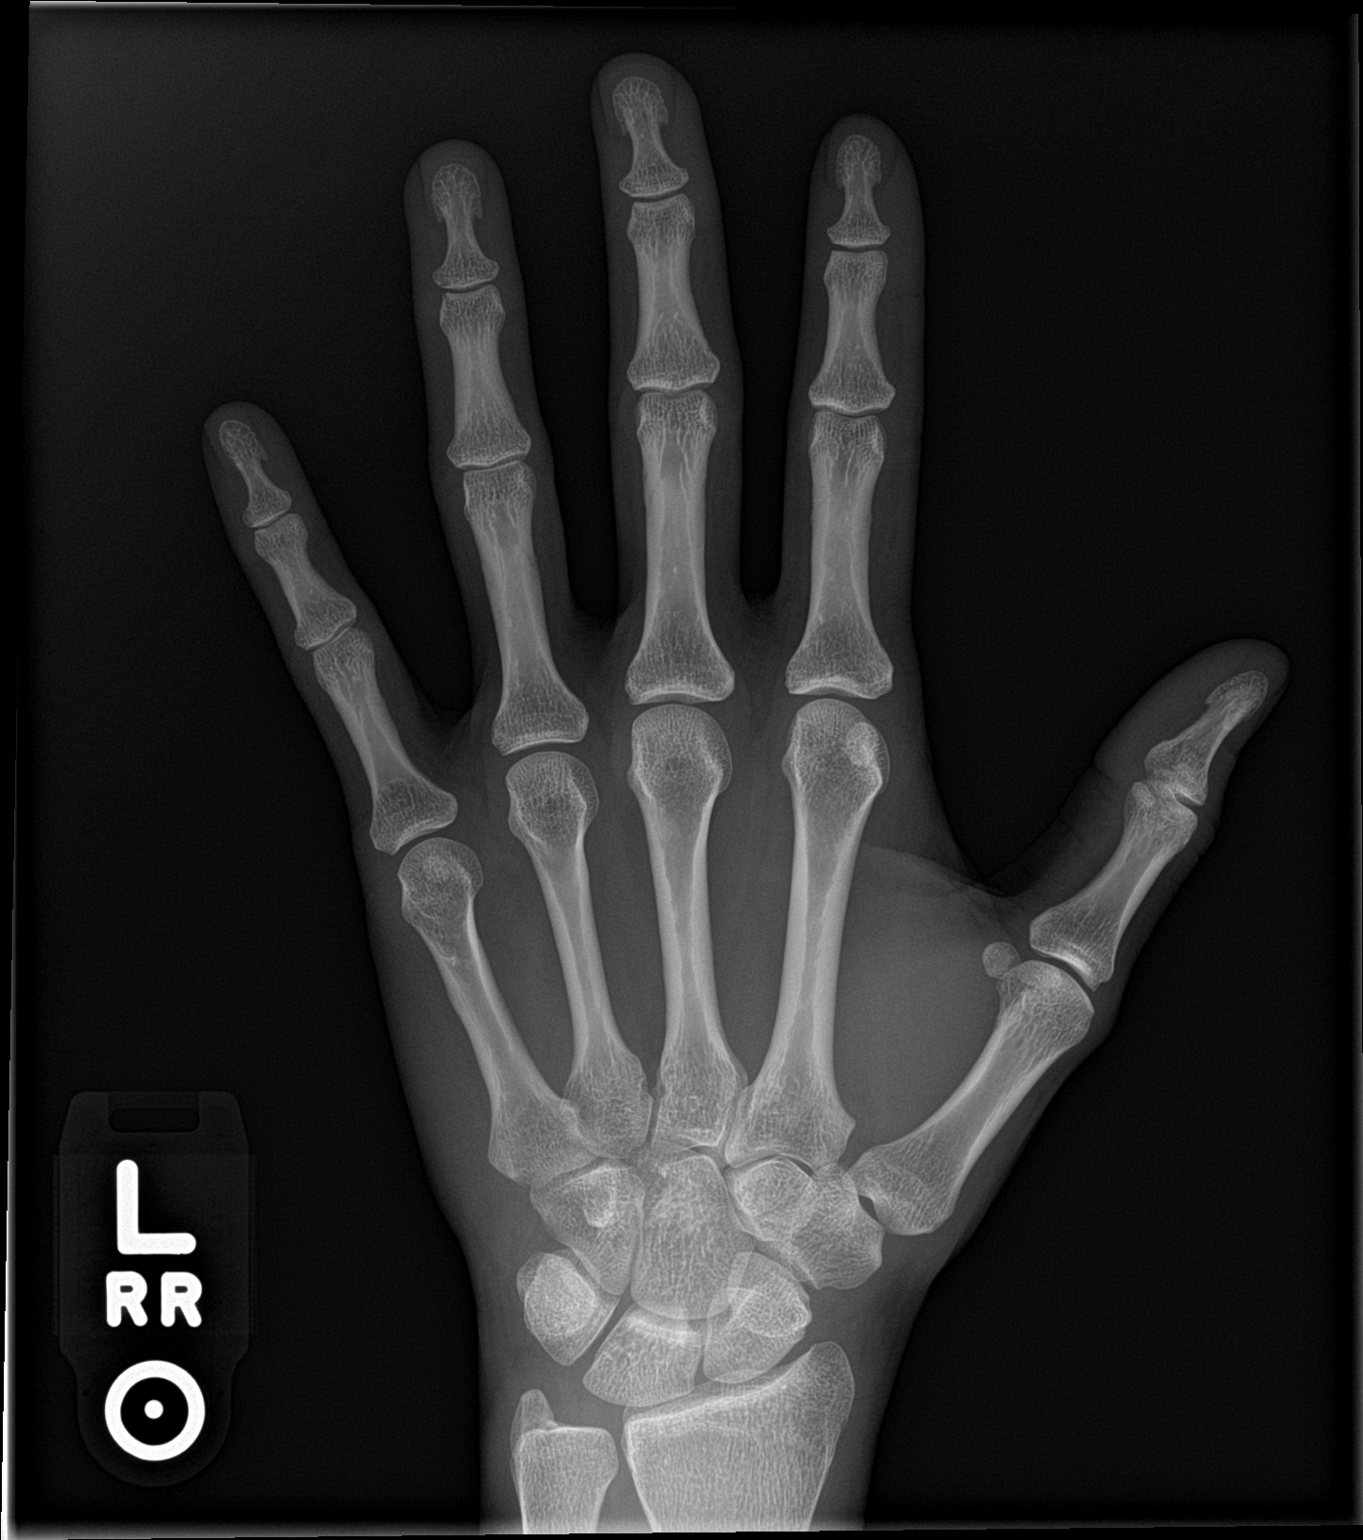

[hand obl]
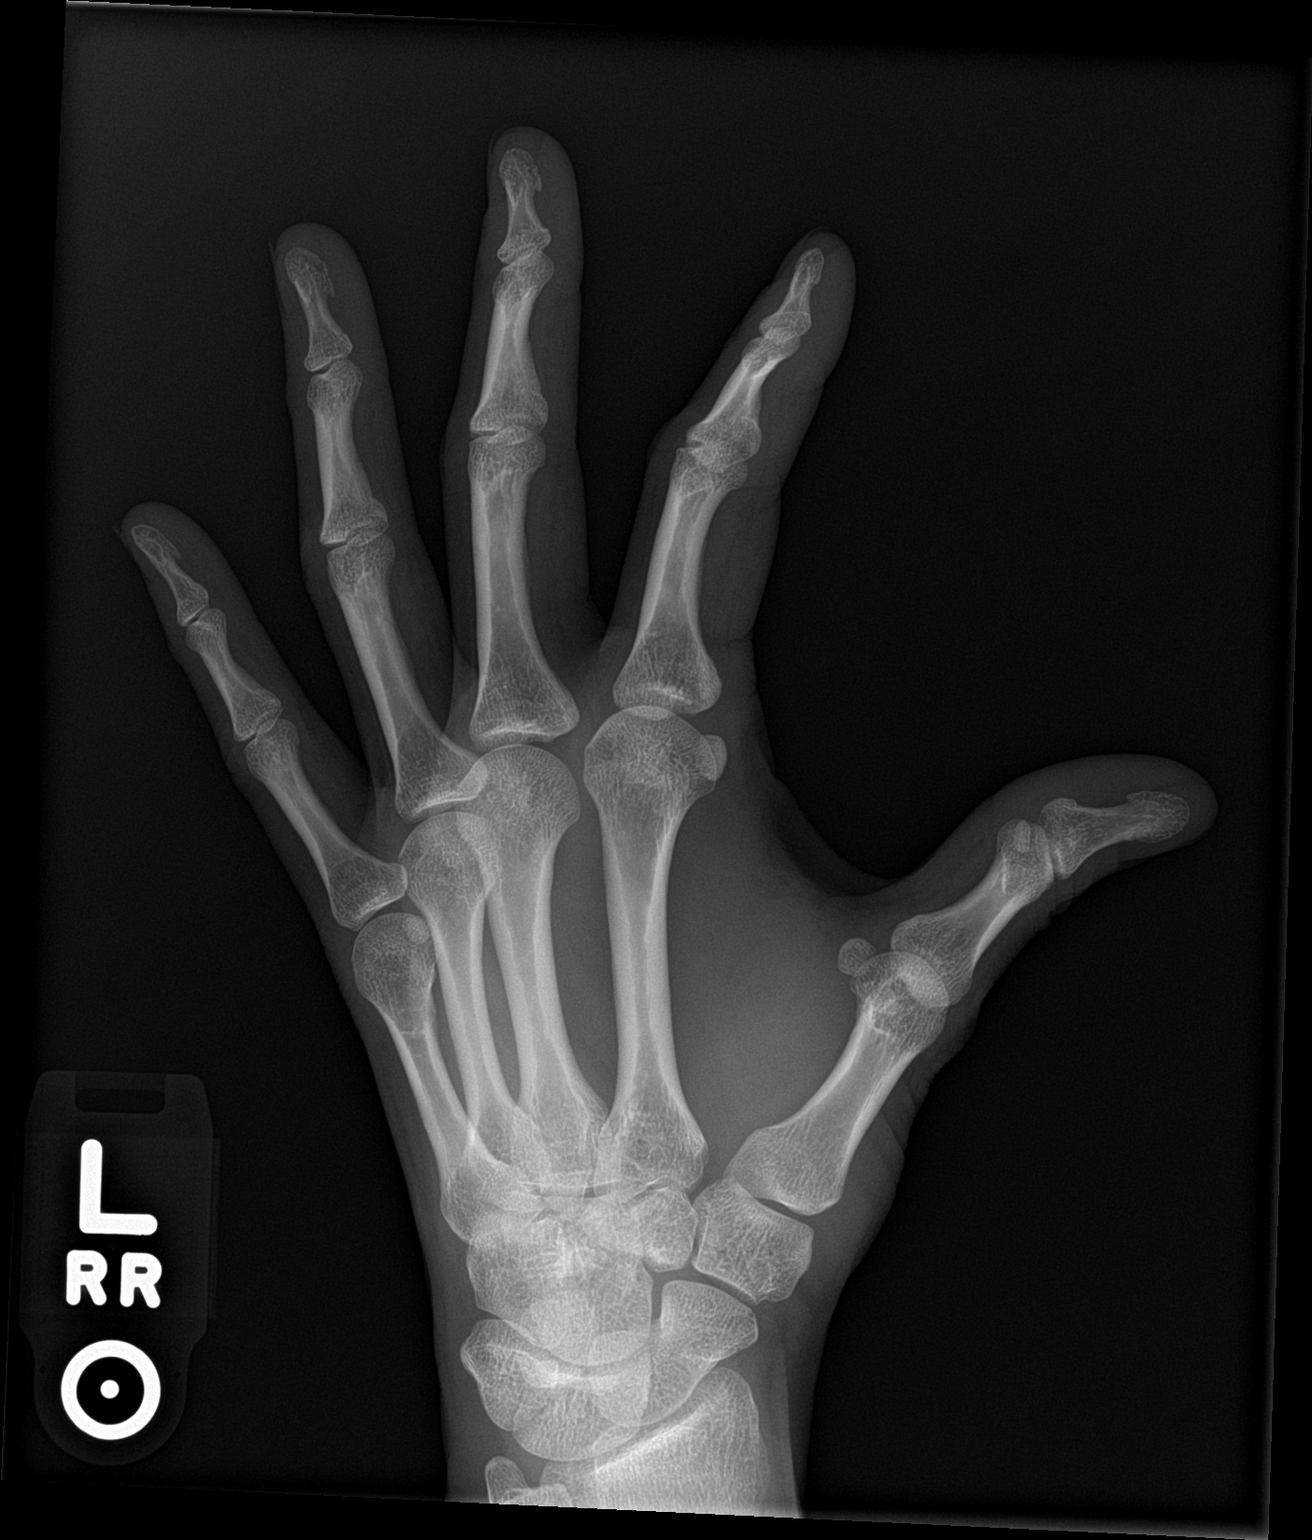

[hand lat]
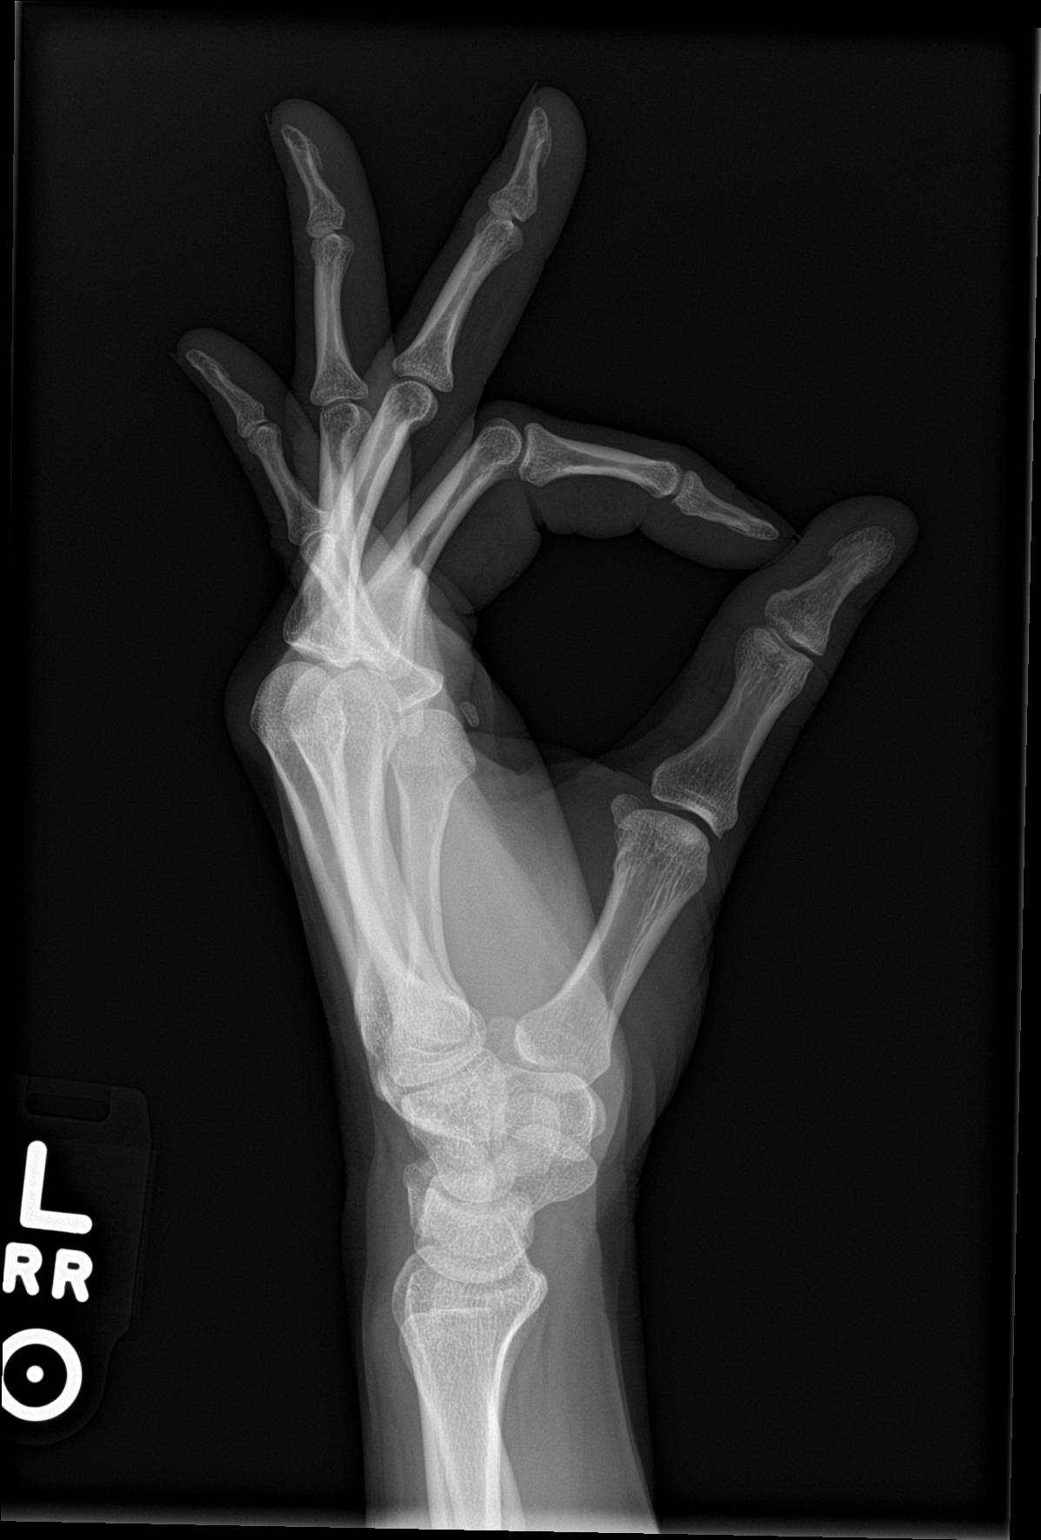

[3 of 3 positions shown; findings below may reference images not displayed]

FINDINGS: There is no evidence of fracture or dislocation. There is no
evidence of arthropathy or other focal bone abnormality. Soft
tissues are unremarkable.
IMPRESSION: No acute fracture or dislocation.

## 2020-07-27 ENCOUNTER — Encounter: Payer: Self-pay | Admitting: Physician Assistant

## 2020-07-27 DIAGNOSIS — F419 Anxiety disorder, unspecified: Secondary | ICD-10-CM

## 2020-07-27 MED ORDER — CLONAZEPAM 0.5 MG PO TABS
0.5000 mg | ORAL_TABLET | Freq: Two times a day (BID) | ORAL | 1 refills | Status: DC | PRN
Start: 2020-07-27 — End: 2021-04-11

## 2020-07-27 MED ORDER — ONDANSETRON HCL 8 MG PO TABS: 8.0000 mg | ORAL_TABLET | Freq: Three times a day (TID) | ORAL | 1 refills | Status: DC | PRN

## 2020-08-16 ENCOUNTER — Encounter: Payer: Self-pay | Admitting: Physician Assistant

## 2020-08-16 ENCOUNTER — Ambulatory Visit: Payer: PRIVATE HEALTH INSURANCE | Admitting: Physician Assistant

## 2020-08-16 ENCOUNTER — Other Ambulatory Visit: Payer: Self-pay

## 2020-08-16 VITALS — BP 129/79 | HR 99 | Temp 98.0°F | Ht 68.0 in | Wt 147.0 lb

## 2020-08-16 DIAGNOSIS — M549 Dorsalgia, unspecified: Secondary | ICD-10-CM | POA: Diagnosis not present

## 2020-08-16 DIAGNOSIS — R5381 Other malaise: Secondary | ICD-10-CM

## 2020-08-16 LAB — POCT URINALYSIS DIP (CLINITEK)
Bilirubin, UA: NEGATIVE
Blood, UA: NEGATIVE
Glucose, UA: NEGATIVE mg/dL
Ketones, POC UA: NEGATIVE mg/dL
Leukocytes, UA: NEGATIVE
Nitrite, UA: NEGATIVE
POC PROTEIN,UA: NEGATIVE
Spec Grav, UA: 1.025 (ref 1.010–1.025)
Urobilinogen, UA: 1 E.U./dL
pH, UA: 6 (ref 5.0–8.0)

## 2020-08-16 MED ORDER — KETOROLAC TROMETHAMINE 60 MG/2ML IM SOLN
60.0000 mg | Freq: Once | INTRAMUSCULAR | Status: AC
  Administered 2020-08-16: 60 mg via INTRAMUSCULAR

## 2020-08-16 MED ORDER — BACLOFEN 10 MG PO TABS: 10.0000 mg | ORAL_TABLET | Freq: Three times a day (TID) | ORAL | 0 refills | Status: DC

## 2020-08-16 NOTE — Progress Notes (Signed)
Go ahead and culture.

## 2020-08-16 NOTE — Patient Instructions (Signed)
Mobic/muscle relaxer/icy hot patches.  Get labs.  Will culture urine follow up as needed.

## 2020-08-16 NOTE — Progress Notes (Signed)
Subjective:    Patient ID: Casey Sweeney, female    DOB: 04-25-1980, 40 y.o.   MRN: [id number]  HPI  Patient is a 40 year old female with mid bilateral back pain for the last 2 days seems to be worsening with a general feeling of malaise.  She is concerned about a kidney or urinary tract infection.  She denies any urinary symptoms.  She denies any fever, chills, body aches.  She denies any back injury.  She has never had anything like this before.  She does not have any upper respiratory symptoms.  She is concerned just with how worn down she feels.  She took some ibuprofen with little relief. .. Active Ambulatory Problems    Diagnosis Date Noted  . Encounter for IUD insertion 02/04/2012  . Anxiety 03/26/2012  . Reactive depression 03/26/2012  . Patellofemoral syndrome of left knee 09/16/2012  . Chronic right shoulder pain 07/28/2013  . Cubital tunnel syndrome on right 07/28/2013  . Family hx-breast malignancy 04/06/2014  . Breast tenderness in female 04/06/2014  . Fullness of breast 04/06/2014  . DDD (degenerative disc disease), cervical 03/22/2015  . Currently attempting to quit smoking 03/30/2015  . Generalized abdominal pain 03/30/2015  . Capsulitis of metatarsophalangeal (MTP) joint of left foot 02/01/2016  . Onychomycosis 02/17/2016  . Stress at home 04/19/2016  . Irritable bowel syndrome with both constipation and diarrhea 05/25/2016  . GAD (generalized anxiety disorder) 12/17/2016  . Inattention 12/17/2016  . History of attention deficit hyperactivity disorder (ADHD) 12/18/2016  . ADD (attention deficit disorder) 02/20/2017  . Obsessive behaviors 02/20/2017  . Obsessive compulsive disorder 02/22/2017  . Recurrent sinusitis 04/24/2017  . Right sided facial pain 04/24/2017  . Mastoid pain, right 04/24/2017  . Chronic maxillary sinusitis 04/24/2017  . Tachycardia 04/25/2017  . Weight loss 11/18/2017  . Left wrist pain 05/07/2018  . Right elbow pain 05/07/2018  .  Primary osteoarthritis of both hands 09/03/2018  . No energy 09/03/2018  . Stress 02/04/2019  . Elevated BP without diagnosis of hypertension 02/04/2019  . Acute mid back pain 08/16/2020   Resolved Ambulatory Problems    Diagnosis Date Noted  . PHARYNGITIS, STREPTOCOCCAL 01/03/2010  . Acute maxillary sinusitis 04/02/2009  . NAUSEA 02/09/2008  . FLANK PAIN, LEFT 02/09/2008  . Acute sinusitis, unspecified 10/19/2010  . Ingrown left big toenail 07/28/2013  . Body aches 04/06/2014  . Viral pharyngitis 09/20/2014  . Constipation 03/30/2015  . Influenza A 06/06/2015  . Stress at work 04/19/2016   Past Medical History:  Diagnosis Date  . Depression   . Headache(784.0)   . History of recurrent UTIs   . Irritable bowel syndrome   . Kidney stones       Review of Systems See HPI.     Objective:   Physical Exam Vitals reviewed.  Constitutional:      Appearance: Normal appearance.  HENT:     Head: Normocephalic.  Cardiovascular:     Rate and Rhythm: Normal rate and regular rhythm.     Pulses: Normal pulses.     Heart sounds: Normal heart sounds.  Pulmonary:     Effort: Pulmonary effort is normal.  Abdominal:     General: Bowel sounds are normal. There is no distension.     Palpations: Abdomen is soft. There is no mass.     Tenderness: There is no abdominal tenderness. There is no right CVA tenderness, left CVA tenderness, guarding or rebound.  Musculoskeletal:     Right lower leg:  No edema.     Left lower leg: No edema.     Comments: No spinal tenderness.  Mid back paraspinal tightness NROM at waist.   Neurological:     General: No focal deficit present.     Mental Status: She is alert and oriented to person, place, and time.  Psychiatric:        Mood and Affect: Mood normal.       .. Results for orders placed or performed in visit on 08/16/20  POCT URINALYSIS DIP (CLINITEK)  Result Value Ref Range   Color, UA yellow yellow   Clarity, UA clear clear    Glucose, UA negative negative mg/dL   Bilirubin, UA negative negative   Ketones, POC UA negative negative mg/dL   Spec Grav, UA 5.573 2.202 - 1.025   Blood, UA negative negative   pH, UA 6.0 5.0 - 8.0   POC PROTEIN,UA negative negative, trace   Urobilinogen, UA 1.0 0.2 or 1.0 E.U./dL   Nitrite, UA Negative Negative   Leukocytes, UA Negative Negative       Assessment & Plan:  Marland KitchenMarland KitchenDiagnoses and all orders for this visit:  Acute mid back pain -     POCT URINALYSIS DIP (CLINITEK) -     CBC with Differential/Platelet -     baclofen (LIORESAL) 10 MG tablet; Take 1 tablet (10 mg total) by mouth 3 (three) times daily. -     COMPLETE METABOLIC PANEL WITH GFR -     ketorolac (TORADOL) injection 60 mg -     Urine Culture  Malaise -     Urine Culture   Unclear etiology of symptoms today.  Her UA in office was negative for blood, protein, leukocytes, nitrites.  We will culture to confirm there is no infection.  Her vitals were reassuring today.  Certainly this sounds more musculoskeletal.  She does not have any reason or injury for this.  We will start with a shot of Toradol 60 mg IM as well as muscle relaxer.  Encouraged IcyHot patches and TENS unit.  She does have some meloxicam that she could also take starting tomorrow that might provide more anti-inflammatory relief.  We will check a CBC and a CMP just to make sure her kidney function, liver function, white blood count are all in normal range.  Certainly follow-up with any new or worsening symptoms.

## 2020-08-17 LAB — CBC WITH DIFFERENTIAL/PLATELET
Absolute Monocytes: 531 cells/uL (ref 200–950)
Basophils Absolute: 70 cells/uL (ref 0–200)
Basophils Relative: 1.1 %
Eosinophils Absolute: 147 cells/uL (ref 15–500)
Eosinophils Relative: 2.3 %
HCT: 41.9 % (ref 35.0–45.0)
Hemoglobin: 13.9 g/dL (ref 11.7–15.5)
Lymphs Abs: 1715 cells/uL (ref 850–3900)
MCH: 30.8 pg (ref 27.0–33.0)
MCHC: 33.2 g/dL (ref 32.0–36.0)
MCV: 92.7 fL (ref 80.0–100.0)
MPV: 11.1 fL (ref 7.5–12.5)
Monocytes Relative: 8.3 %
Neutro Abs: 3936 cells/uL (ref 1500–7800)
Neutrophils Relative %: 61.5 %
Platelets: 217 10*3/uL (ref 140–400)
RBC: 4.52 10*6/uL (ref 3.80–5.10)
RDW: 12 % (ref 11.0–15.0)
Total Lymphocyte: 26.8 %
WBC: 6.4 10*3/uL (ref 3.8–10.8)

## 2020-08-17 LAB — COMPLETE METABOLIC PANEL WITH GFR
AG Ratio: 2.1 (calc) (ref 1.0–2.5)
ALT: 11 U/L (ref 6–29)
AST: 13 U/L (ref 10–30)
Albumin: 4.4 g/dL (ref 3.6–5.1)
Alkaline phosphatase (APISO): 57 U/L (ref 31–125)
BUN: 15 mg/dL (ref 7–25)
CO2: 22 mmol/L (ref 20–32)
Calcium: 9 mg/dL (ref 8.6–10.2)
Chloride: 107 mmol/L (ref 98–110)
Creat: 1.02 mg/dL (ref 0.50–1.10)
GFR, Est African American: 81 mL/min/{1.73_m2} (ref >=60)
GFR, Est Non African American: 70 mL/min/{1.73_m2} (ref >=60)
Globulin: 2.1 g/dL (calc) (ref 1.9–3.7)
Glucose, Bld: 72 mg/dL (ref 65–99)
Potassium: 4.1 mmol/L (ref 3.5–5.3)
Sodium: 139 mmol/L (ref 135–146)
Total Bilirubin: 0.4 mg/dL (ref 0.2–1.2)
Total Protein: 6.5 g/dL (ref 6.1–8.1)

## 2020-08-17 LAB — SPECIMEN COMPROMISED

## 2020-08-17 NOTE — Progress Notes (Signed)
Kidney, liver, glucose, white count all look great.

## 2020-08-18 LAB — URINE CULTURE
MICRO NUMBER:: 11981216
Result:: NO GROWTH
SPECIMEN QUALITY:: ADEQUATE

## 2020-08-18 NOTE — Progress Notes (Signed)
Casey Sweeney,   No bacteria growth on urine culture. How are your symptoms today?

## 2020-08-23 ENCOUNTER — Encounter: Payer: Self-pay | Admitting: Physician Assistant

## 2020-08-23 DIAGNOSIS — F341 Dysthymic disorder: Secondary | ICD-10-CM

## 2020-08-23 DIAGNOSIS — F988 Other specified behavioral and emotional disorders with onset usually occurring in childhood and adolescence: Secondary | ICD-10-CM

## 2020-08-23 DIAGNOSIS — F419 Anxiety disorder, unspecified: Secondary | ICD-10-CM

## 2020-08-24 MED ORDER — LISDEXAMFETAMINE DIMESYLATE 40 MG PO CAPS: 40.0000 mg | ORAL_CAPSULE | ORAL | 0 refills | Status: DC

## 2020-09-01 MED ORDER — BUPROPION HCL ER (XL) 300 MG PO TB24: 300.0000 mg | ORAL_TABLET | Freq: Every day | ORAL | 1 refills | Status: DC

## 2020-09-01 NOTE — Addendum Note (Signed)
Addended by: Chalmers Cater on: 09/01/2020 07:53 AM   Modules accepted: Orders

## 2020-09-14 ENCOUNTER — Other Ambulatory Visit: Payer: Self-pay | Admitting: Physician Assistant

## 2020-09-14 MED ORDER — CYCLOBENZAPRINE HCL 10 MG PO TABS: 10.0000 mg | ORAL_TABLET | Freq: Three times a day (TID) | ORAL | 0 refills | Status: DC | PRN

## 2020-12-14 ENCOUNTER — Encounter: Payer: Self-pay | Admitting: Physician Assistant

## 2020-12-14 ENCOUNTER — Telehealth (INDEPENDENT_AMBULATORY_CARE_PROVIDER_SITE_OTHER): Payer: No Typology Code available for payment source | Admitting: Physician Assistant

## 2020-12-14 VITALS — Temp 98.0°F | Ht 68.0 in | Wt 145.0 lb

## 2020-12-14 DIAGNOSIS — F988 Other specified behavioral and emotional disorders with onset usually occurring in childhood and adolescence: Secondary | ICD-10-CM

## 2020-12-14 DIAGNOSIS — T887XXA Unspecified adverse effect of drug or medicament, initial encounter: Secondary | ICD-10-CM | POA: Diagnosis not present

## 2020-12-14 MED ORDER — LISDEXAMFETAMINE DIMESYLATE 40 MG PO CAPS: 40.0000 mg | ORAL_CAPSULE | ORAL | 0 refills | Status: DC

## 2020-12-14 NOTE — Progress Notes (Signed)
..Virtual Visit via Telephone Note  I connected with Casey Sweeney on 12/14/20 at  8:30 AM EDT by telephone and verified that I am speaking with the correct person using two identifiers.  Location: Patient: work Provider: clinic  .Marland KitchenParticipating in visit:  Patient: Casey Sweeney Provider: Tandy Gaw PA-C   I discussed the limitations, risks, security and privacy concerns of performing an evaluation and management service by telephone and the availability of in person appointments. I also discussed with the patient that there may be a patient responsible charge related to this service. The patient expressed understanding and agreed to proceed.   History of Present Illness: Patient is a 40 year old female with ADHD who presents to the clinic via telephone to discuss medication.  Patient is doing great on medication as far as productivity at work.  She denies any problems with increased anxiety, palpitations, headaches, vision changes.  She is having some nausea when she takes it.  She has tried taking it with food.  She is having to use Zofran fairly regularly.  When she uses the Zofran right when she takes that she has no other problems.  She has tried Theatre manager and Adderall in the past not had great benefit.  She is hesitant to change her Vyvanse. She wonders what else she can do for nausea.     .. Active Ambulatory Problems    Diagnosis Date Noted   Encounter for IUD insertion 02/04/2012   Anxiety 03/26/2012   Reactive depression 03/26/2012   Patellofemoral syndrome of left knee 09/16/2012   Chronic right shoulder pain 07/28/2013   Cubital tunnel syndrome on right 07/28/2013   Family hx-breast malignancy 04/06/2014   Breast tenderness in female 04/06/2014   Fullness of breast 04/06/2014   DDD (degenerative disc disease), cervical 03/22/2015   Currently attempting to quit smoking 03/30/2015   Generalized abdominal pain 03/30/2015   Capsulitis of metatarsophalangeal (MTP) joint of left  foot 02/01/2016   Onychomycosis 02/17/2016   Stress at home 04/19/2016   Irritable bowel syndrome with both constipation and diarrhea 05/25/2016   GAD (generalized anxiety disorder) 12/17/2016   Inattention 12/17/2016   History of attention deficit hyperactivity disorder (ADHD) 12/18/2016   ADD (attention deficit disorder) 02/20/2017   Obsessive behaviors 02/20/2017   Obsessive compulsive disorder 02/22/2017   Recurrent sinusitis 04/24/2017   Right sided facial pain 04/24/2017   Mastoid pain, right 04/24/2017   Chronic maxillary sinusitis 04/24/2017   Tachycardia 04/25/2017   Weight loss 11/18/2017   Left wrist pain 05/07/2018   Right elbow pain 05/07/2018   Primary osteoarthritis of both hands 09/03/2018   No energy 09/03/2018   Stress 02/04/2019   Elevated BP without diagnosis of hypertension 02/04/2019   Acute mid back pain 08/16/2020   Resolved Ambulatory Problems    Diagnosis Date Noted   PHARYNGITIS, STREPTOCOCCAL 01/03/2010   Acute maxillary sinusitis 04/02/2009   NAUSEA 02/09/2008   FLANK PAIN, LEFT 02/09/2008   Acute sinusitis, unspecified 10/19/2010   Ingrown left big toenail 07/28/2013   Body aches 04/06/2014   Viral pharyngitis 09/20/2014   Constipation 03/30/2015   Influenza A 06/06/2015   Stress at work 04/19/2016   Past Medical History:  Diagnosis Date   Depression    Headache(784.0)    History of recurrent UTIs    Irritable bowel syndrome    Kidney stones     Observations/Objective: No acute distress Normal breathing.  .. Today's Vitals   12/14/20 0811  Temp: 98 F (36.7 C)  TempSrc: Oral  Weight: 145 lb (65.8 kg)  Height: 5\' 8"  (1.727 m)   Body mass index is 22.05 kg/m.    Assessment and Plan: Marland KitchenTyjanae was seen today for adhd.  Diagnoses and all orders for this visit:  Medication side effect  Attention deficit disorder (ADD) without hyperactivity -     lisdexamfetamine (VYVANSE) 40 MG capsule; Take 1 capsule (40 mg total) by  mouth every morning. -     lisdexamfetamine (VYVANSE) 40 MG capsule; Take 1 capsule (40 mg total) by mouth every morning. -     lisdexamfetamine (VYVANSE) 40 MG capsule; Take 1 capsule (40 mg total) by mouth every morning.  Refill Vyvanse for 3 months.  Discussed trying something like ginger or herbal to help with nausea.  Encourage patient to continue taking with a meal or bike to eat.  If no improvement in the next 3 months strongly consider trying another medication may be cotempla.    Follow Up Instructions:    I discussed the assessment and treatment plan with the patient. The patient was provided an opportunity to ask questions and all were answered. The patient agreed with the plan and demonstrated an understanding of the instructions.   The patient was advised to call back or seek an in-person evaluation if the symptoms worsen or if the condition fails to improve as anticipated.  I provided 20 minutes of non-face-to-face time during this encounter.   Marchelle Folks, PA-C

## 2020-12-14 NOTE — Progress Notes (Signed)
Needs refills of Vyvanse  Having to take nausea meds daily wanted to discuss

## 2021-03-22 ENCOUNTER — Telehealth: Payer: PRIVATE HEALTH INSURANCE | Admitting: Physician Assistant

## 2021-03-22 VITALS — BP 128/82 | Ht 68.0 in | Wt 140.0 lb

## 2021-03-22 DIAGNOSIS — M549 Dorsalgia, unspecified: Secondary | ICD-10-CM

## 2021-03-22 DIAGNOSIS — F988 Other specified behavioral and emotional disorders with onset usually occurring in childhood and adolescence: Secondary | ICD-10-CM

## 2021-03-22 DIAGNOSIS — F419 Anxiety disorder, unspecified: Secondary | ICD-10-CM

## 2021-03-22 DIAGNOSIS — F341 Dysthymic disorder: Secondary | ICD-10-CM | POA: Diagnosis not present

## 2021-03-22 MED ORDER — LISDEXAMFETAMINE DIMESYLATE 40 MG PO CAPS: 40.0000 mg | ORAL_CAPSULE | ORAL | 0 refills | Status: DC

## 2021-03-22 MED ORDER — BUPROPION HCL ER (XL) 300 MG PO TB24: 300.0000 mg | ORAL_TABLET | Freq: Every day | ORAL | 3 refills | Status: DC

## 2021-03-22 MED ORDER — BACLOFEN 10 MG PO TABS: 10.0000 mg | ORAL_TABLET | Freq: Three times a day (TID) | ORAL | 1 refills | Status: DC

## 2021-03-22 NOTE — Progress Notes (Signed)
..Virtual Visit via Video Note  I connected with Casey Sweeney on 03/22/21 at  1:40 PM EST by a video enabled telemedicine application and verified that I am speaking with the correct person using two identifiers.  Location: Patient: work Provider: clinic  .Marland KitchenParticipating in visit:  Patient: Casey Sweeney Provider: Tandy Gaw PA-C Provider in training: Shawna Orleans PA-S   I discussed the limitations of evaluation and management by telemedicine and the availability of in person appointments. The patient expressed understanding and agreed to proceed.  History of Present Illness: Pt is a 41 yo female with ADHD, GAD, dysthmia who presents to the clinic for medication refills.   She is doing well. Life has normal stressors but overall ok. No SI/HC. She is Production designer, theatre/television/film at work and doing well. She intermittently needs muscle relaxer for mid back pain. Needs refills.    .. Active Ambulatory Problems    Diagnosis Date Noted   Encounter for IUD insertion 02/04/2012   Anxiety 03/26/2012   Reactive depression 03/26/2012   Patellofemoral syndrome of left knee 09/16/2012   Chronic right shoulder pain 07/28/2013   Cubital tunnel syndrome on right 07/28/2013   Family hx-breast malignancy 04/06/2014   Breast tenderness in female 04/06/2014   Fullness of breast 04/06/2014   DDD (degenerative disc disease), cervical 03/22/2015   Currently attempting to quit smoking 03/30/2015   Generalized abdominal pain 03/30/2015   Capsulitis of metatarsophalangeal (MTP) joint of left foot 02/01/2016   Onychomycosis 02/17/2016   Stress at home 04/19/2016   Irritable bowel syndrome with both constipation and diarrhea 05/25/2016   GAD (generalized anxiety disorder) 12/17/2016   Inattention 12/17/2016   History of attention deficit hyperactivity disorder (ADHD) 12/18/2016   ADD (attention deficit disorder) 02/20/2017   Obsessive behaviors 02/20/2017   Obsessive compulsive disorder 02/22/2017   Recurrent sinusitis  04/24/2017   Right sided facial pain 04/24/2017   Mastoid pain, right 04/24/2017   Chronic maxillary sinusitis 04/24/2017   Tachycardia 04/25/2017   Weight loss 11/18/2017   Left wrist pain 05/07/2018   Right elbow pain 05/07/2018   Primary osteoarthritis of both hands 09/03/2018   No energy 09/03/2018   Stress 02/04/2019   Elevated BP without diagnosis of hypertension 02/04/2019   Acute mid back pain 08/16/2020   Dysthymia 03/22/2021   Resolved Ambulatory Problems    Diagnosis Date Noted   PHARYNGITIS, STREPTOCOCCAL 01/03/2010   Acute maxillary sinusitis 04/02/2009   NAUSEA 02/09/2008   FLANK PAIN, LEFT 02/09/2008   Acute sinusitis, unspecified 10/19/2010   Ingrown left big toenail 07/28/2013   Body aches 04/06/2014   Viral pharyngitis 09/20/2014   Constipation 03/30/2015   Influenza A 06/06/2015   Stress at work 04/19/2016   Past Medical History:  Diagnosis Date   Depression    Headache(784.0)    History of recurrent UTIs    Irritable bowel syndrome    Kidney stones     Observations/Objective: No acute distress Normal mood and appearance Normal breathing  .Marland Kitchen Today's Vitals   03/22/21 1322  BP: 128/82  Weight: 140 lb (63.5 kg)  Height: 5\' 8"  (1.727 m)   Body mass index is 21.29 kg/m.  .. Depression screen [facility] 2/9 03/22/2021 12/14/2020 04/18/2020 12/30/2019 09/09/2019  Decreased Interest 0 0 0 0 0  Down, Depressed, Hopeless 0 0 0 0 0  PHQ - 2 Score 0 0 0 0 0  Altered sleeping 1 - - - 0  Tired, decreased energy 0 - - - 0  Change in appetite 0 - - -  0  Feeling bad or failure about yourself  1 - - - 0  Trouble concentrating 0 - - - 0  Moving slowly or fidgety/restless 0 - - - 0  Suicidal thoughts 0 - - - 0  PHQ-9 Score 2 - - - 0  Difficult doing work/chores Not difficult at all - - - Not difficult at all  Some recent data might be hidden   .Marland Kitchen GAD 7 : Generalized Anxiety Score 03/22/2021 09/09/2019 05/22/2019 02/04/2019  Nervous, Anxious, on Edge 1 0 3 2   Control/stop worrying 1 0 0 0  Worry too much - different things 1 0 0 1  Trouble relaxing 3 0 3 3  Restless 0 0 1 0  Easily annoyed or irritable 1 0 1 3  Afraid - awful might happen 0 0 0 0  Total GAD 7 Score 7 0 8 9  Anxiety Difficulty Not difficult at all Not difficult at all Not difficult at all Not difficult at all       Assessment and Plan: Marland KitchenMarland KitchenAlicen was seen today for follow-up.  Diagnoses and all orders for this visit:  Attention deficit disorder (ADD) without hyperactivity -     lisdexamfetamine (VYVANSE) 40 MG capsule; Take 1 capsule (40 mg total) by mouth every morning. -     lisdexamfetamine (VYVANSE) 40 MG capsule; Take 1 capsule (40 mg total) by mouth every morning. -     lisdexamfetamine (VYVANSE) 40 MG capsule; Take 1 capsule (40 mg total) by mouth every morning. -     buPROPion (WELLBUTRIN XL) 300 MG 24 hr tablet; Take 1 tablet (300 mg total) by mouth daily.  Anxiety -     buPROPion (WELLBUTRIN XL) 300 MG 24 hr tablet; Take 1 tablet (300 mg total) by mouth daily.  Dysthymia -     buPROPion (WELLBUTRIN XL) 300 MG 24 hr tablet; Take 1 tablet (300 mg total) by mouth daily.  Acute mid back pain -     baclofen (LIORESAL) 10 MG tablet; Take 1 tablet (10 mg total) by mouth 3 (three) times daily.   Pt is doing well.  Refilled medications. Follow up in 6 months.    Follow Up Instructions:    I discussed the assessment and treatment plan with the patient. The patient was provided an opportunity to ask questions and all were answered. The patient agreed with the plan and demonstrated an understanding of the instructions.   The patient was advised to call back or seek an in-person evaluation if the symptoms worsen or if the condition fails to improve as anticipated.   Tandy Gaw, PA-C

## 2021-03-24 ENCOUNTER — Encounter: Payer: Self-pay | Admitting: Physician Assistant

## 2021-04-11 ENCOUNTER — Other Ambulatory Visit: Payer: Self-pay | Admitting: Physician Assistant

## 2021-04-11 DIAGNOSIS — F419 Anxiety disorder, unspecified: Secondary | ICD-10-CM

## 2021-04-28 ENCOUNTER — Encounter: Payer: Self-pay | Admitting: Physician Assistant

## 2021-04-28 MED ORDER — CYCLOBENZAPRINE HCL 10 MG PO TABS: 10.0000 mg | ORAL_TABLET | Freq: Three times a day (TID) | ORAL | 1 refills | Status: DC | PRN

## 2021-06-02 ENCOUNTER — Encounter: Payer: Self-pay | Admitting: Physician Assistant

## 2021-07-03 ENCOUNTER — Telehealth (INDEPENDENT_AMBULATORY_CARE_PROVIDER_SITE_OTHER): Payer: PRIVATE HEALTH INSURANCE | Admitting: Physician Assistant

## 2021-07-03 ENCOUNTER — Encounter: Payer: Self-pay | Admitting: Physician Assistant

## 2021-07-03 VITALS — Ht 68.0 in | Wt 148.0 lb

## 2021-07-03 DIAGNOSIS — Z1329 Encounter for screening for other suspected endocrine disorder: Secondary | ICD-10-CM

## 2021-07-03 DIAGNOSIS — F988 Other specified behavioral and emotional disorders with onset usually occurring in childhood and adolescence: Secondary | ICD-10-CM | POA: Diagnosis not present

## 2021-07-03 DIAGNOSIS — Z13220 Encounter for screening for lipoid disorders: Secondary | ICD-10-CM | POA: Diagnosis not present

## 2021-07-03 DIAGNOSIS — R030 Elevated blood-pressure reading, without diagnosis of hypertension: Secondary | ICD-10-CM | POA: Diagnosis not present

## 2021-07-03 DIAGNOSIS — K582 Mixed irritable bowel syndrome: Secondary | ICD-10-CM

## 2021-07-03 DIAGNOSIS — F419 Anxiety disorder, unspecified: Secondary | ICD-10-CM

## 2021-07-03 MED ORDER — LISDEXAMFETAMINE DIMESYLATE 40 MG PO CAPS: 40.0000 mg | ORAL_CAPSULE | ORAL | 0 refills | Status: DC

## 2021-07-03 MED ORDER — DICYCLOMINE HCL 10 MG PO CAPS: 10.0000 mg | ORAL_CAPSULE | Freq: Four times a day (QID) | ORAL | 1 refills | Status: DC | PRN

## 2021-07-03 NOTE — Progress Notes (Signed)
Needs refills of Vyvanse and Bentyl - different pharmacies, pended.  ? ?PHQ9 (2) completed.  ?

## 2021-07-03 NOTE — Progress Notes (Signed)
.Virtual Visit via Video Note ? ?I connected with Casey Sweeney on 07/03/21 at  4:00 PM EDT by a video enabled telemedicine application and verified that I am speaking with the correct person using two identifiers. ? ?Location: ?Patient: work ?Provider: clinic ? ?Marland Kitchen.Participating in visit:  ?Patient: Casey Sweeney ?Provider: Tandy Gaw PA-C ?  ?I discussed the limitations of evaluation and management by telemedicine and the availability of in person appointments. The patient expressed understanding and agreed to proceed. ? ?History of Present Illness: ?Pt is a 41 yo female with ADHD, IBS, Anxiety who needs refills. She is doing well. She is working a lot and focus is great. Her IBS symptoms are very well controlled. She does feel tearful and overwhelmed at times when she thinks about her mother. Her mother has cancer and going through chemo and radiation. No SI/HC.  ? ?.. ?Active Ambulatory Problems  ?  Diagnosis Date Noted  ? Encounter for IUD insertion 02/04/2012  ? Anxiety 03/26/2012  ? Reactive depression 03/26/2012  ? Patellofemoral syndrome of left knee 09/16/2012  ? Chronic right shoulder pain 07/28/2013  ? Cubital tunnel syndrome on right 07/28/2013  ? Family hx-breast malignancy 04/06/2014  ? Breast tenderness in female 04/06/2014  ? Fullness of breast 04/06/2014  ? DDD (degenerative disc disease), cervical 03/22/2015  ? Currently attempting to quit smoking 03/30/2015  ? Generalized abdominal pain 03/30/2015  ? Capsulitis of metatarsophalangeal (MTP) joint of left foot 02/01/2016  ? Onychomycosis 02/17/2016  ? Stress at home 04/19/2016  ? Irritable bowel syndrome with both constipation and diarrhea 05/25/2016  ? GAD (generalized anxiety disorder) 12/17/2016  ? Inattention 12/17/2016  ? History of attention deficit hyperactivity disorder (ADHD) 12/18/2016  ? ADD (attention deficit disorder) 02/20/2017  ? Obsessive behaviors 02/20/2017  ? Obsessive compulsive disorder 02/22/2017  ? Recurrent sinusitis 04/24/2017   ? Right sided facial pain 04/24/2017  ? Mastoid pain, right 04/24/2017  ? Chronic maxillary sinusitis 04/24/2017  ? Tachycardia 04/25/2017  ? Weight loss 11/18/2017  ? Left wrist pain 05/07/2018  ? Right elbow pain 05/07/2018  ? Primary osteoarthritis of both hands 09/03/2018  ? No energy 09/03/2018  ? Stress 02/04/2019  ? Elevated BP without diagnosis of hypertension 02/04/2019  ? Acute mid back pain 08/16/2020  ? Dysthymia 03/22/2021  ? ?Resolved Ambulatory Problems  ?  Diagnosis Date Noted  ? PHARYNGITIS, STREPTOCOCCAL 01/03/2010  ? Acute maxillary sinusitis 04/02/2009  ? NAUSEA 02/09/2008  ? FLANK PAIN, LEFT 02/09/2008  ? Acute sinusitis, unspecified 10/19/2010  ? Ingrown left big toenail 07/28/2013  ? Body aches 04/06/2014  ? Viral pharyngitis 09/20/2014  ? Constipation 03/30/2015  ? Influenza A 06/06/2015  ? Stress at work 04/19/2016  ? ?Past Medical History:  ?Diagnosis Date  ? Depression   ? Headache(784.0)   ? History of recurrent UTIs   ? Irritable bowel syndrome   ? Kidney stones   ? ? ? ?  ?Observations/Objective: ?No acute distress ?Normal mood and appearance ?Normal breathing ? ?.. ?Today's Vitals  ? 07/03/21 1451  ?Weight: 148 lb (67.1 kg)  ?Height: 5\' 8"  (1.727 m)  ? ?Body mass index is 22.5 kg/m?. ? ?.. ? ?  07/03/2021  ?  2:52 PM 03/22/2021  ?  1:16 PM 12/14/2020  ?  8:13 AM 04/18/2020  ?  9:58 AM 12/30/2019  ?  7:56 AM  ?Depression screen PHQ 2/9  ?Decreased Interest 0 0 0 0 0  ?Down, Depressed, Hopeless 0 0 0 0 0  ?PHQ -  2 Score 0 0 0 0 0  ?Altered sleeping 0 1     ?Tired, decreased energy 1 0     ?Change in appetite 0 0     ?Feeling bad or failure about yourself  0 1     ?Trouble concentrating 0 0     ?Moving slowly or fidgety/restless 1 0     ?Suicidal thoughts 0 0     ?PHQ-9 Score 2 2     ?Difficult doing work/chores Not difficult at all Not difficult at all     ? ?.. ? ?  03/22/2021  ?  1:18 PM 09/09/2019  ?  8:14 AM 05/22/2019  ? 10:57 AM 02/04/2019  ?  1:12 PM  ?GAD 7 : Generalized Anxiety Score   ?Nervous, Anxious, on Edge 1 0 3 2  ?Control/stop worrying 1 0 0 0  ?Worry too much - different things 1 0 0 1  ?Trouble relaxing 3 0 3 3  ?Restless 0 0 1 0  ?Easily annoyed or irritable 1 0 1 3  ?Afraid - awful might happen 0 0 0 0  ?Total GAD 7 Score 7 0 8 9  ?Anxiety Difficulty Not difficult at all Not difficult at all Not difficult at all Not difficult at all  ? ? ? ? ? ?Assessment and Plan: ?..Casey Sweeney was seen today for follow-up and adhd. ? ?Diagnoses and all orders for this visit: ? ?Attention deficit disorder (ADD) without hyperactivity ?-     lisdexamfetamine (VYVANSE) 40 MG capsule; Take 1 capsule (40 mg total) by mouth every morning. ?-     lisdexamfetamine (VYVANSE) 40 MG capsule; Take 1 capsule (40 mg total) by mouth every morning. ?-     lisdexamfetamine (VYVANSE) 40 MG capsule; Take 1 capsule (40 mg total) by mouth every morning. ? ?Elevated BP without diagnosis of hypertension ?-     COMPLETE METABOLIC PANEL WITH GFR ? ?Thyroid disorder screen ?-     TSH ? ?Lipid screening ?-     Lipid Panel w/reflex Direct LDL ? ?Irritable bowel syndrome with both constipation and diarrhea ?-     dicyclomine (BENTYL) 10 MG capsule; Take 1 capsule (10 mg total) by mouth 4 (four) times daily as needed for spasms. ? ?Anxiety ? ? ?She is doing well with focus and IBS symptoms.  ?Refilled medications ?She has some emotional moments with her mother going through chemo ?Using xanax as needed  ?Discussed adding a SSRI. Pt will hold on that.  ?Follow up in 3 months.  ? ? ?Follow Up Instructions: ? ?  ?I discussed the assessment and treatment plan with the patient. The patient was provided an opportunity to ask questions and all were answered. The patient agreed with the plan and demonstrated an understanding of the instructions. ?  ?The patient was advised to call back or seek an in-person evaluation if the symptoms worsen or if the condition fails to improve as anticipated. ? ?Tandy Gaw, PA-C ? ?

## 2021-09-08 ENCOUNTER — Other Ambulatory Visit: Payer: Self-pay | Admitting: Physician Assistant

## 2021-09-08 DIAGNOSIS — F419 Anxiety disorder, unspecified: Secondary | ICD-10-CM

## 2021-09-08 NOTE — Telephone Encounter (Signed)
Last written 04/11/2021 #30 with 1 refill Last appt 07/03/2021

## 2021-10-03 ENCOUNTER — Other Ambulatory Visit: Payer: Self-pay | Admitting: Physician Assistant

## 2021-10-03 DIAGNOSIS — F988 Other specified behavioral and emotional disorders with onset usually occurring in childhood and adolescence: Secondary | ICD-10-CM

## 2021-10-03 NOTE — Telephone Encounter (Signed)
Pt called. She is requesting a refill on her Vyvanse until her appointment on August 16.

## 2021-10-04 NOTE — Telephone Encounter (Signed)
Please encourage patient to request her refills through MyChart.  Please verify pharmacy.  Medication pended.

## 2021-10-04 NOTE — Telephone Encounter (Signed)
Patient advised. She would like medication sent to [facility] on [address].  Pended pharmacy.

## 2021-10-05 MED ORDER — LISDEXAMFETAMINE DIMESYLATE 40 MG PO CAPS: 40.0000 mg | ORAL_CAPSULE | ORAL | 0 refills | Status: DC

## 2021-10-25 ENCOUNTER — Ambulatory Visit: Payer: PRIVATE HEALTH INSURANCE | Admitting: Physician Assistant

## 2021-11-07 ENCOUNTER — Telehealth: Payer: Self-pay | Admitting: Physician Assistant

## 2021-11-07 DIAGNOSIS — F988 Other specified behavioral and emotional disorders with onset usually occurring in childhood and adolescence: Secondary | ICD-10-CM

## 2021-11-07 MED ORDER — LISDEXAMFETAMINE DIMESYLATE 40 MG PO CAPS: 40.0000 mg | ORAL_CAPSULE | ORAL | 0 refills | Status: DC

## 2021-11-07 NOTE — Telephone Encounter (Signed)
Pt called. She is requesting a refill on her Vyvanse until her appointment on September 12th.

## 2021-11-21 ENCOUNTER — Ambulatory Visit (INDEPENDENT_AMBULATORY_CARE_PROVIDER_SITE_OTHER): Payer: PRIVATE HEALTH INSURANCE | Admitting: Physician Assistant

## 2021-11-21 ENCOUNTER — Encounter: Payer: Self-pay | Admitting: Physician Assistant

## 2021-11-21 VITALS — BP 144/83 | HR 105 | Ht 68.0 in | Wt 156.0 lb

## 2021-11-21 DIAGNOSIS — F988 Other specified behavioral and emotional disorders with onset usually occurring in childhood and adolescence: Secondary | ICD-10-CM | POA: Diagnosis not present

## 2021-11-21 DIAGNOSIS — G479 Sleep disorder, unspecified: Secondary | ICD-10-CM

## 2021-11-21 DIAGNOSIS — R4589 Other symptoms and signs involving emotional state: Secondary | ICD-10-CM

## 2021-11-21 DIAGNOSIS — Z639 Problem related to primary support group, unspecified: Secondary | ICD-10-CM

## 2021-11-21 DIAGNOSIS — R6882 Decreased libido: Secondary | ICD-10-CM | POA: Diagnosis not present

## 2021-11-21 DIAGNOSIS — F411 Generalized anxiety disorder: Secondary | ICD-10-CM

## 2021-11-21 DIAGNOSIS — R110 Nausea: Secondary | ICD-10-CM

## 2021-11-21 DIAGNOSIS — F419 Anxiety disorder, unspecified: Secondary | ICD-10-CM

## 2021-11-21 DIAGNOSIS — F341 Dysthymic disorder: Secondary | ICD-10-CM

## 2021-11-21 DIAGNOSIS — R030 Elevated blood-pressure reading, without diagnosis of hypertension: Secondary | ICD-10-CM

## 2021-11-21 MED ORDER — LISDEXAMFETAMINE DIMESYLATE 40 MG PO CAPS: 40.0000 mg | ORAL_CAPSULE | ORAL | 0 refills | Status: DC

## 2021-11-21 MED ORDER — CLONAZEPAM 0.5 MG PO TABS: 0.5000 mg | ORAL_TABLET | Freq: Two times a day (BID) | ORAL | 1 refills | Status: DC | PRN

## 2021-11-21 MED ORDER — TRAZODONE HCL 50 MG PO TABS: 25.0000 mg | ORAL_TABLET | Freq: Every evening | ORAL | 3 refills | Status: DC | PRN

## 2021-11-21 MED ORDER — BUPROPION HCL ER (XL) 300 MG PO TB24: 300.0000 mg | ORAL_TABLET | Freq: Every day | ORAL | 3 refills | Status: DC

## 2021-11-21 MED ORDER — VORTIOXETINE HBR 5 MG PO TABS: 5.0000 mg | ORAL_TABLET | Freq: Every day | ORAL | 1 refills | Status: DC

## 2021-11-21 NOTE — Progress Notes (Signed)
Established Patient Office Visit  Subjective   Patient ID: Casey Sweeney, female    DOB: 03-30-1980  Age: 41 y.o. MRN: [id number]  Chief Complaint  Patient presents with  . Annual Exam    HPI Pt is a 41 yo female   .Marland Kitchen Active Ambulatory Problems    Diagnosis Date Noted  . Encounter for IUD insertion 02/04/2012  . Anxiety 03/26/2012  . Reactive depression 03/26/2012  . Patellofemoral syndrome of left knee 09/16/2012  . Chronic right shoulder pain 07/28/2013  . Cubital tunnel syndrome on right 07/28/2013  . Family hx-breast malignancy 04/06/2014  . Breast tenderness in female 04/06/2014  . Fullness of breast 04/06/2014  . DDD (degenerative disc disease), cervical 03/22/2015  . Currently attempting to quit smoking 03/30/2015  . Generalized abdominal pain 03/30/2015  . Capsulitis of metatarsophalangeal (MTP) joint of left foot 02/01/2016  . Onychomycosis 02/17/2016  . Stress at home 04/19/2016  . Irritable bowel syndrome with both constipation and diarrhea 05/25/2016  . GAD (generalized anxiety disorder) 12/17/2016  . Inattention 12/17/2016  . History of attention deficit hyperactivity disorder (ADHD) 12/18/2016  . ADD (attention deficit disorder) 02/20/2017  . Obsessive behaviors 02/20/2017  . Obsessive compulsive disorder 02/22/2017  . Recurrent sinusitis 04/24/2017  . Right sided facial pain 04/24/2017  . Mastoid pain, right 04/24/2017  . Chronic maxillary sinusitis 04/24/2017  . Tachycardia 04/25/2017  . Weight loss 11/18/2017  . Left wrist pain 05/07/2018  . Right elbow pain 05/07/2018  . Primary osteoarthritis of both hands 09/03/2018  . No energy 09/03/2018  . Stress 02/04/2019  . Elevated BP without diagnosis of hypertension 02/04/2019  . Acute mid back pain 08/16/2020  . Dysthymia 03/22/2021  . Low libido 11/21/2021  . Relationship problems 11/21/2021   Resolved Ambulatory Problems    Diagnosis Date Noted  . PHARYNGITIS, STREPTOCOCCAL 01/03/2010  .  Acute maxillary sinusitis 04/02/2009  . NAUSEA 02/09/2008  . FLANK PAIN, LEFT 02/09/2008  . Acute sinusitis, unspecified 10/19/2010  . Ingrown left big toenail 07/28/2013  . Body aches 04/06/2014  . Viral pharyngitis 09/20/2014  . Constipation 03/30/2015  . Influenza A 06/06/2015  . Stress at work 04/19/2016   Past Medical History:  Diagnosis Date  . Depression   . Headache(784.0)   . History of recurrent UTIs   . Irritable bowel syndrome   . Kidney stones      Review of Systems  All other systems reviewed and are negative.     Objective:     BP (!) 144/83   Pulse (!) 105   Ht 5\' 8"  (1.727 m)   Wt 156 lb (70.8 kg)   SpO2 100%   BMI 23.72 kg/m  BP Readings from Last 3 Encounters:  11/21/21 (!) 144/83  03/22/21 128/82  08/16/20 129/79   Wt Readings from Last 3 Encounters:  11/21/21 156 lb (70.8 kg)  07/03/21 148 lb (67.1 kg)  03/22/21 140 lb (63.5 kg)    ..    11/21/2021    9:24 AM 07/03/2021    2:52 PM 03/22/2021    1:16 PM 12/14/2020    8:13 AM 04/18/2020    9:58 AM  Depression screen PHQ 2/9  Decreased Interest 0 0 0 0 0  Down, Depressed, Hopeless 0 0 0 0 0  PHQ - 2 Score 0 0 0 0 0  Altered sleeping 3 0 1    Tired, decreased energy 3 1 0    Change in appetite 1 0 0  Feeling bad or failure about yourself  1 0 1    Trouble concentrating 0 0 0    Moving slowly or fidgety/restless 1 1 0    Suicidal thoughts 0 0 0    PHQ-9 Score 9 2 2     Difficult doing work/chores Somewhat difficult Not difficult at all Not difficult at all     ..    11/21/2021    9:24 AM 03/22/2021    1:18 PM 09/09/2019    8:14 AM 05/22/2019   10:57 AM  GAD 7 : Generalized Anxiety Score  Nervous, Anxious, on Edge 2 1 0 3  Control/stop worrying 1 1 0 0  Worry too much - different things 1 1 0 0  Trouble relaxing 1 3 0 3  Restless 0 0 0 1  Easily annoyed or irritable 1 1 0 1  Afraid - awful might happen 0 0 0 0  Total GAD 7 Score 6 7 0 8  Anxiety Difficulty Somewhat difficult  Not difficult at all Not difficult at all Not difficult at all      Physical Exam Constitutional:      Appearance: Normal appearance.  Cardiovascular:     Rate and Rhythm: Normal rate.  Pulmonary:     Effort: Pulmonary effort is normal.  Neurological:     General: No focal deficit present.     Mental Status: She is alert and oriented to person, place, and time.  Psychiatric:        Mood and Affect: Mood normal.        Assessment & Plan:  5/12/2021Marland KitchenClatie was seen today for annual exam.  Diagnoses and all orders for this visit:  Low libido  Attention deficit disorder (ADD) without hyperactivity -     lisdexamfetamine (VYVANSE) 40 MG capsule; Take 1 capsule (40 mg total) by mouth every morning. -     lisdexamfetamine (VYVANSE) 40 MG capsule; Take 1 capsule (40 mg total) by mouth every morning. -     buPROPion (WELLBUTRIN XL) 300 MG 24 hr tablet; Take 1 tablet (300 mg total) by mouth daily.  Anxiety -     buPROPion (WELLBUTRIN XL) 300 MG 24 hr tablet; Take 1 tablet (300 mg total) by mouth daily. -     clonazePAM (KLONOPIN) 0.5 MG tablet; Take 1 tablet (0.5 mg total) by mouth 2 (two) times daily as needed for anxiety.  Dysthymia -     buPROPion (WELLBUTRIN XL) 300 MG 24 hr tablet; Take 1 tablet (300 mg total) by mouth daily.  Relationship problems  GAD (generalized anxiety disorder)  Trouble in sleeping -     traZODone (DESYREL) 50 MG tablet; Take 0.5-1 tablets (25-50 mg total) by mouth at bedtime as needed for sleep.  Depressed mood -     vortioxetine HBr (TRINTELLIX) 5 MG TABS tablet; Take 1 tablet (5 mg total) by mouth daily.  Elevated BP without diagnosis of hypertension   Will monitor BP due to elevation today Keep watch at home  Recheck in 4 weeks  PHQ/GAD not to goal pt reports a lot of triggers  Work load/ partner problems/finances.  Discussed counseling but she just "doesn't want to start over".  Added trintellix for mood, failed zoloft/lexapro/celexa.   Added trazodone for sleep. Continue wellbutrin and vyvanse. Follow up in 4 weeks.    Casey Folks, PA-C

## 2021-11-22 ENCOUNTER — Other Ambulatory Visit: Payer: Self-pay | Admitting: Physician Assistant

## 2021-11-22 DIAGNOSIS — R110 Nausea: Secondary | ICD-10-CM | POA: Insufficient documentation

## 2021-11-22 LAB — LIPID PANEL W/REFLEX DIRECT LDL
Cholesterol: 181 mg/dL (ref <200)
HDL: 48 mg/dL — ABNORMAL LOW (ref >=50)
LDL Cholesterol (Calc): 117 mg/dL (calc) — ABNORMAL HIGH
Non-HDL Cholesterol (Calc): 133 mg/dL (calc) — ABNORMAL HIGH (ref <130)
Total CHOL/HDL Ratio: 3.8 (calc) (ref <5.0)
Triglycerides: 70 mg/dL (ref <150)

## 2021-11-22 LAB — COMPLETE METABOLIC PANEL WITH GFR
AG Ratio: 2 (calc) (ref 1.0–2.5)
ALT: 13 U/L (ref 6–29)
AST: 15 U/L (ref 10–30)
Albumin: 4.9 g/dL (ref 3.6–5.1)
Alkaline phosphatase (APISO): 79 U/L (ref 31–125)
BUN: 12 mg/dL (ref 7–25)
CO2: 26 mmol/L (ref 20–32)
Calcium: 9.7 mg/dL (ref 8.6–10.2)
Chloride: 106 mmol/L (ref 98–110)
Creat: 0.82 mg/dL (ref 0.50–0.97)
Globulin: 2.4 g/dL (calc) (ref 1.9–3.7)
Glucose, Bld: 95 mg/dL (ref 65–99)
Potassium: 4 mmol/L (ref 3.5–5.3)
Sodium: 141 mmol/L (ref 135–146)
Total Bilirubin: 0.5 mg/dL (ref 0.2–1.2)
Total Protein: 7.3 g/dL (ref 6.1–8.1)
eGFR: 94 mL/min/{1.73_m2} (ref >=60)

## 2021-11-22 LAB — TSH: TSH: 3.6 mIU/L

## 2021-11-22 NOTE — Progress Notes (Signed)
Casey Sweeney,   HDL is better than 4 years ago. That is good.  Your LDL is 117 and not quite to optimal level but ok.  Kidney, liver, glucose look good.  Your TSH is normal range but has increased over the years. Recheck in 6 months to watch for further increase.

## 2021-11-30 ENCOUNTER — Telehealth: Payer: Self-pay

## 2021-11-30 NOTE — Telephone Encounter (Signed)
Initiated Prior authorization [id number] (formerly Brintellix) 5MG  tablets Via: Covermymeds Case/Key:[id number] Status: approved  as of 11/30/21 Reason:approved through 12/01/2022 Notified Pt via: Mychart

## 2021-12-05 ENCOUNTER — Encounter: Payer: Self-pay | Admitting: Physician Assistant

## 2021-12-27 ENCOUNTER — Encounter: Payer: Self-pay | Admitting: Physician Assistant

## 2021-12-27 ENCOUNTER — Ambulatory Visit: Payer: PRIVATE HEALTH INSURANCE | Admitting: Physician Assistant

## 2021-12-27 VITALS — BP 137/84 | HR 120 | Ht 68.0 in | Wt 156.0 lb

## 2021-12-27 DIAGNOSIS — R030 Elevated blood-pressure reading, without diagnosis of hypertension: Secondary | ICD-10-CM

## 2021-12-27 DIAGNOSIS — F411 Generalized anxiety disorder: Secondary | ICD-10-CM | POA: Diagnosis not present

## 2021-12-27 DIAGNOSIS — Z639 Problem related to primary support group, unspecified: Secondary | ICD-10-CM | POA: Diagnosis not present

## 2021-12-27 DIAGNOSIS — G479 Sleep disorder, unspecified: Secondary | ICD-10-CM | POA: Diagnosis not present

## 2021-12-27 NOTE — Progress Notes (Signed)
Established Patient Office Visit  Subjective   Patient ID: Casey Sweeney, female    DOB: [birth year]  Age: 41 y.o. MRN: [id number]  Chief Complaint  Patient presents with   Follow-up    HPI Pt is a 41 yo female who is here for 4 week follow up on mood, sleep medications. She is doing better. Trazodone does help with sleep but seems to dream more. She just started trintellix. All other medications the same. She is talking more with husband and they are working on things. Work is getting better and she has more help. Her mother is doing great on new chemotherapy drug.    .. Active Ambulatory Problems    Diagnosis Date Noted   Encounter for IUD insertion 02/04/2012   Anxiety 03/26/2012   Reactive depression 03/26/2012   Patellofemoral syndrome of left knee 09/16/2012   Chronic right shoulder pain 07/28/2013   Cubital tunnel syndrome on right 07/28/2013   Family hx-breast malignancy 04/06/2014   Breast tenderness in female 04/06/2014   Fullness of breast 04/06/2014   DDD (degenerative disc disease), cervical 03/22/2015   Currently attempting to quit smoking 03/30/2015   Generalized abdominal pain 03/30/2015   Capsulitis of metatarsophalangeal (MTP) joint of left foot 02/01/2016   Onychomycosis 02/17/2016   Stress at home 04/19/2016   Irritable bowel syndrome with both constipation and diarrhea 05/25/2016   GAD (generalized anxiety disorder) 12/17/2016   Inattention 12/17/2016   History of attention deficit hyperactivity disorder (ADHD) 12/18/2016   ADD (attention deficit disorder) 02/20/2017   Obsessive behaviors 02/20/2017   Obsessive compulsive disorder 02/22/2017   Recurrent sinusitis 04/24/2017   Right sided facial pain 04/24/2017   Mastoid pain, right 04/24/2017   Chronic maxillary sinusitis 04/24/2017   Tachycardia 04/25/2017   Weight loss 11/18/2017   Left wrist pain 05/07/2018   Right elbow pain 05/07/2018   Primary osteoarthritis of both hands 09/03/2018   No  energy 09/03/2018   Stress 02/04/2019   Elevated BP without diagnosis of hypertension 02/04/2019   Acute mid back pain 08/16/2020   Dysthymia 03/22/2021   Low libido 11/21/2021   Relationship problems 11/21/2021   Nausea 11/22/2021   Resolved Ambulatory Problems    Diagnosis Date Noted   PHARYNGITIS, STREPTOCOCCAL 01/03/2010   Acute maxillary sinusitis 04/02/2009   NAUSEA 02/09/2008   FLANK PAIN, LEFT 02/09/2008   Acute sinusitis, unspecified 10/19/2010   Ingrown left big toenail 07/28/2013   Body aches 04/06/2014   Viral pharyngitis 09/20/2014   Constipation 03/30/2015   Influenza A 06/06/2015   Stress at work 04/19/2016   Past Medical History:  Diagnosis Date   Depression    Headache(784.0)    History of recurrent UTIs    Irritable bowel syndrome    Kidney stones      ROS See HPI.    Objective:     BP 137/84   Pulse (!) 120   Ht 5\' 8"  (1.727 m)   Wt 156 lb (70.8 kg)   SpO2 98%   BMI 23.72 kg/m  BP Readings from Last 3 Encounters:  12/27/21 137/84  11/21/21 (!) 144/83  03/22/21 128/82   Wt Readings from Last 3 Encounters:  12/27/21 156 lb (70.8 kg)  11/21/21 156 lb (70.8 kg)  07/03/21 148 lb (67.1 kg)    ..    12/27/2021    7:56 AM 11/21/2021    9:24 AM 07/03/2021    2:52 PM 03/22/2021    1:16 PM 12/14/2020    8:13  AM  Depression screen PHQ 2/9  Decreased Interest 0 0 0 0 0  Down, Depressed, Hopeless 1 0 0 0 0  PHQ - 2 Score 1 0 0 0 0  Altered sleeping 1 3 0 1   Tired, decreased energy 2 3 1  0   Change in appetite 0 1 0 0   Feeling bad or failure about yourself  2 1 0 1   Trouble concentrating 0 0 0 0   Moving slowly or fidgety/restless 0 1 1 0   Suicidal thoughts 0 0 0 0   PHQ-9 Score 6 9 2 2    Difficult doing work/chores Somewhat difficult Somewhat difficult Not difficult at all Not difficult at all    ..    12/27/2021    7:57 AM 11/21/2021    9:24 AM 03/22/2021    1:18 PM 09/09/2019    8:14 AM  GAD 7 : Generalized Anxiety Score   Nervous, Anxious, on Edge 2 2 1  0  Control/stop worrying 1 1 1  0  Worry too much - different things 2 1 1  0  Trouble relaxing 2 1 3  0  Restless 0 0 0 0  Easily annoyed or irritable 2 1 1  0  Afraid - awful might happen 0 0 0 0  Total GAD 7 Score 9 6 7  0  Anxiety Difficulty Somewhat difficult Somewhat difficult Not difficult at all Not difficult at all      Physical Exam Cardiovascular:     Rate and Rhythm: Normal rate.  Pulmonary:     Effort: Pulmonary effort is normal.  Neurological:     General: No focal deficit present.     Mental Status: She is oriented to person, place, and time.  Psychiatric:        Mood and Affect: Mood normal.         Assessment & Plan:  .Casey Sweeney was seen today for follow-up.  Diagnoses and all orders for this visit:  Relationship problems  GAD (generalized anxiety disorder)  Elevated BP without diagnosis of hypertension  Trouble in sleeping   Pt is doing better Stay on trintellix for now Give it a little more time to see if we need to increase BP better Continue trazodone as needed for sleep  Iran Planas, PA-C

## 2022-01-02 ENCOUNTER — Other Ambulatory Visit: Payer: Self-pay | Admitting: Physician Assistant

## 2022-01-02 ENCOUNTER — Encounter: Payer: Self-pay | Admitting: Physician Assistant

## 2022-01-02 MED ORDER — HYDROCHLOROTHIAZIDE 12.5 MG PO TABS: 12.5000 mg | ORAL_TABLET | Freq: Every day | ORAL | 1 refills | Status: DC

## 2022-01-02 NOTE — Progress Notes (Signed)
H

## 2022-01-24 MED ORDER — HYDROCHLOROTHIAZIDE 25 MG PO TABS: 25.0000 mg | ORAL_TABLET | Freq: Every day | ORAL | 0 refills | Status: DC

## 2022-02-08 ENCOUNTER — Other Ambulatory Visit: Payer: Self-pay | Admitting: Physician Assistant

## 2022-02-08 DIAGNOSIS — R4589 Other symptoms and signs involving emotional state: Secondary | ICD-10-CM

## 2022-02-13 ENCOUNTER — Other Ambulatory Visit: Payer: Self-pay | Admitting: Family Medicine

## 2022-02-13 DIAGNOSIS — F988 Other specified behavioral and emotional disorders with onset usually occurring in childhood and adolescence: Secondary | ICD-10-CM

## 2022-02-13 MED ORDER — LISDEXAMFETAMINE DIMESYLATE 40 MG PO CAPS: 40.0000 mg | ORAL_CAPSULE | ORAL | 0 refills | Status: DC

## 2022-02-14 ENCOUNTER — Other Ambulatory Visit: Payer: Self-pay | Admitting: Physician Assistant

## 2022-02-20 ENCOUNTER — Ambulatory Visit: Payer: PRIVATE HEALTH INSURANCE | Admitting: Physician Assistant

## 2022-03-26 ENCOUNTER — Encounter: Payer: Self-pay | Admitting: Physician Assistant

## 2022-03-26 ENCOUNTER — Ambulatory Visit: Payer: PRIVATE HEALTH INSURANCE | Admitting: Physician Assistant

## 2022-03-26 VITALS — BP 113/60 | HR 102 | Ht 68.0 in | Wt 152.1 lb

## 2022-03-26 DIAGNOSIS — F419 Anxiety disorder, unspecified: Secondary | ICD-10-CM | POA: Diagnosis not present

## 2022-03-26 DIAGNOSIS — I10 Essential (primary) hypertension: Secondary | ICD-10-CM

## 2022-03-26 DIAGNOSIS — F988 Other specified behavioral and emotional disorders with onset usually occurring in childhood and adolescence: Secondary | ICD-10-CM | POA: Diagnosis not present

## 2022-03-26 DIAGNOSIS — F329 Major depressive disorder, single episode, unspecified: Secondary | ICD-10-CM

## 2022-03-26 DIAGNOSIS — R6882 Decreased libido: Secondary | ICD-10-CM

## 2022-03-26 DIAGNOSIS — F411 Generalized anxiety disorder: Secondary | ICD-10-CM | POA: Diagnosis not present

## 2022-03-26 DIAGNOSIS — R1084 Generalized abdominal pain: Secondary | ICD-10-CM

## 2022-03-26 MED ORDER — BUPROPION HCL ER (XL) 150 MG PO TB24: 150.0000 mg | ORAL_TABLET | ORAL | 3 refills | Status: DC

## 2022-03-26 MED ORDER — LISDEXAMFETAMINE DIMESYLATE 40 MG PO CAPS: 40.0000 mg | ORAL_CAPSULE | ORAL | 0 refills | Status: DC

## 2022-03-26 MED ORDER — ADDYI 100 MG PO TABS: 1.0000 | ORAL_TABLET | Freq: Every day | ORAL | 3 refills | Status: DC

## 2022-03-26 MED ORDER — HYDROCHLOROTHIAZIDE 25 MG PO TABS: 25.0000 mg | ORAL_TABLET | Freq: Every day | ORAL | 0 refills | Status: DC

## 2022-03-26 MED ORDER — CLONAZEPAM 0.5 MG PO TABS: 0.5000 mg | ORAL_TABLET | Freq: Two times a day (BID) | ORAL | 1 refills | Status: DC | PRN

## 2022-03-26 NOTE — Progress Notes (Unsigned)
   Established Patient Office Visit  Subjective   Patient ID: Casey Sweeney, female    DOB: Oct 01, 1980  Age: 42 y.o. MRN: [id number]  No chief complaint on file.   HPI  Patient Active Problem List   Diagnosis Date Noted   Nausea 11/22/2021   Low libido 11/21/2021   Relationship problems 11/21/2021   Dysthymia 03/22/2021   Acute mid back pain 08/16/2020   Stress 02/04/2019   Elevated BP without diagnosis of hypertension 02/04/2019   Primary osteoarthritis of both hands 09/03/2018   No energy 09/03/2018   Left wrist pain 05/07/2018   Right elbow pain 05/07/2018   Weight loss 11/18/2017   Tachycardia 04/25/2017   Recurrent sinusitis 04/24/2017   Right sided facial pain 04/24/2017   Mastoid pain, right 04/24/2017   Chronic maxillary sinusitis 04/24/2017   Obsessive compulsive disorder 02/22/2017   ADD (attention deficit disorder) 02/20/2017   Obsessive behaviors 02/20/2017   History of attention deficit hyperactivity disorder (ADHD) 12/18/2016   GAD (generalized anxiety disorder) 12/17/2016   Inattention 12/17/2016   Irritable bowel syndrome with both constipation and diarrhea 05/25/2016   Stress at home 04/19/2016   Onychomycosis 02/17/2016   Capsulitis of metatarsophalangeal (MTP) joint of left foot 02/01/2016   Currently attempting to quit smoking 03/30/2015   Generalized abdominal pain 03/30/2015   DDD (degenerative disc disease), cervical 03/22/2015   Family hx-breast malignancy 04/06/2014   Breast tenderness in female 04/06/2014   Fullness of breast 04/06/2014   Chronic right shoulder pain 07/28/2013   Cubital tunnel syndrome on right 07/28/2013   Patellofemoral syndrome of left knee 09/16/2012   Anxiety 03/26/2012   Reactive depression 03/26/2012   Encounter for IUD insertion 02/04/2012   Past Medical History:  Diagnosis Date   Anxiety    Depression    Headache(784.0)    migraines   History of recurrent UTIs    Irritable bowel syndrome    Kidney stones     Family History  Problem Relation Age of Onset   Cancer Other 58       unknown cancer   Hypertension Mother    Aortic aneurysm Mother    Myelodysplastic syndrome Mother    Lung cancer Father        lung cancer   Cancer Paternal Aunt        breast   Cancer Maternal Grandfather        unknown   Stomach cancer Maternal Uncle    Allergies  Allergen Reactions   Lexapro [Escitalopram Oxalate]     Worsening depression.    Strattera [Atomoxetine Hcl]     Heart burn      ROS    Objective:     There were no vitals taken for this visit. BP Readings from Last 3 Encounters:  12/27/21 137/84  11/21/21 (!) 144/83  03/22/21 128/82   Wt Readings from Last 3 Encounters:  12/27/21 156 lb (70.8 kg)  11/21/21 156 lb (70.8 kg)  07/03/21 148 lb (67.1 kg)      Physical Exam      Assessment & Plan:     Iran Planas, PA-C

## 2022-03-27 ENCOUNTER — Encounter: Payer: Self-pay | Admitting: Physician Assistant

## 2022-03-27 DIAGNOSIS — I10 Essential (primary) hypertension: Secondary | ICD-10-CM | POA: Insufficient documentation

## 2022-04-06 ENCOUNTER — Telehealth: Payer: Self-pay

## 2022-04-06 NOTE — Telephone Encounter (Addendum)
Initiated Prior authorization [id number] 100MG tablets Via: Covermymeds Case/Key:[id number] Status: denied  as of 04/06/22 Reason:Per your health plan's criteria, this drug is covered if you meet the following: (1) You still have your periods (premenopausal). The information provided does not show that you meet the criteria listed abov Notified Pt via: Mychart

## 2022-04-23 ENCOUNTER — Encounter: Payer: Self-pay | Admitting: Physician Assistant

## 2022-04-24 NOTE — Telephone Encounter (Signed)
Patient also called about this, she wants to know if we can submit an appeal? Please advise.

## 2022-04-25 ENCOUNTER — Telehealth: Payer: Self-pay

## 2022-04-25 NOTE — Telephone Encounter (Addendum)
Initiated appeal [id number] 100MG tablets Via: Covermymeds Case/Key:[id number]  Status: approved  as of 04/25/22 Reason:approved through 07/24/2022 Notified Pt via: Mychart

## 2022-06-19 ENCOUNTER — Encounter: Payer: Self-pay | Admitting: Physician Assistant

## 2022-06-26 ENCOUNTER — Ambulatory Visit (INDEPENDENT_AMBULATORY_CARE_PROVIDER_SITE_OTHER): Payer: PRIVATE HEALTH INSURANCE | Admitting: Physician Assistant

## 2022-06-26 ENCOUNTER — Encounter: Payer: Self-pay | Admitting: Physician Assistant

## 2022-06-26 VITALS — BP 124/79 | HR 109 | Ht 68.0 in | Wt 149.0 lb

## 2022-06-26 DIAGNOSIS — M26609 Unspecified temporomandibular joint disorder, unspecified side: Secondary | ICD-10-CM

## 2022-06-26 DIAGNOSIS — F988 Other specified behavioral and emotional disorders with onset usually occurring in childhood and adolescence: Secondary | ICD-10-CM | POA: Diagnosis not present

## 2022-06-26 DIAGNOSIS — R4589 Other symptoms and signs involving emotional state: Secondary | ICD-10-CM | POA: Insufficient documentation

## 2022-06-26 DIAGNOSIS — F458 Other somatoform disorders: Secondary | ICD-10-CM

## 2022-06-26 DIAGNOSIS — I10 Essential (primary) hypertension: Secondary | ICD-10-CM

## 2022-06-26 MED ORDER — LISDEXAMFETAMINE DIMESYLATE 40 MG PO CAPS: 40.0000 mg | ORAL_CAPSULE | ORAL | 0 refills | Status: DC

## 2022-06-26 MED ORDER — HYDROCHLOROTHIAZIDE 25 MG PO TABS: 25.0000 mg | ORAL_TABLET | Freq: Every day | ORAL | 0 refills | Status: DC

## 2022-06-26 MED ORDER — CYCLOBENZAPRINE HCL 10 MG PO TABS: 10.0000 mg | ORAL_TABLET | Freq: Three times a day (TID) | ORAL | 1 refills | Status: AC | PRN

## 2022-06-26 NOTE — Progress Notes (Unsigned)
Established Patient Office Visit  Subjective   Patient ID: Casey Sweeney, female    DOB: 24-Jun-1980  Age: 42 y.o. MRN: [id number]  Chief Complaint  Patient presents with   Follow-up    HPI Pt is a 42 yo female who presents to the clinic for follow up on medications.   ADHD is controlled on vyvanse with no concerns. She continues to sleep well. She denies any palpitations, headaches. Her panic attacks are better with decreasing wellbutrin. She continues to have some anxiety and stress. She is "clinching her teeth" a lot and causing TMJ pain. She has continued not to smoke. She feels like this has increased her anxiety a lot. She is using a mouth guard at night.   Not checking BP at home. No CP, palpitations, headaches or vision changes.    Active Ambulatory Problems    Diagnosis Date Noted   Encounter for IUD insertion 02/04/2012   Anxiety 03/26/2012   Reactive depression 03/26/2012   Patellofemoral syndrome of left knee 09/16/2012   Chronic right shoulder pain 07/28/2013   Cubital tunnel syndrome on right 07/28/2013   Family hx-breast malignancy 04/06/2014   Breast tenderness in female 04/06/2014   Fullness of breast 04/06/2014   DDD (degenerative disc disease), cervical 03/22/2015   Currently attempting to quit smoking 03/30/2015   Generalized abdominal pain 03/30/2015   Capsulitis of metatarsophalangeal (MTP) joint of left foot 02/01/2016   Onychomycosis 02/17/2016   Stress at home 04/19/2016   Irritable bowel syndrome with both constipation and diarrhea 05/25/2016   GAD (generalized anxiety disorder) 12/17/2016   Inattention 12/17/2016   History of attention deficit hyperactivity disorder (ADHD) 12/18/2016   ADD (attention deficit disorder) 02/20/2017   Obsessive behaviors 02/20/2017   Obsessive compulsive disorder 02/22/2017   Recurrent sinusitis 04/24/2017   Right sided facial pain 04/24/2017   Mastoid pain, right 04/24/2017   Chronic maxillary sinusitis  04/24/2017   Tachycardia 04/25/2017   Weight loss 11/18/2017   Left wrist pain 05/07/2018   Right elbow pain 05/07/2018   Primary osteoarthritis of both hands 09/03/2018   No energy 09/03/2018   Stress 02/04/2019   Elevated BP without diagnosis of hypertension 02/04/2019   Acute mid back pain 08/16/2020   Dysthymia 03/22/2021   Low libido 11/21/2021   Relationship problems 11/21/2021   Nausea 11/22/2021   Primary hypertension 03/27/2022   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   Teeth grinding 06/26/2022   Depressed mood 06/26/2022   Resolved Ambulatory Problems    Diagnosis Date Noted   PHARYNGITIS, STREPTOCOCCAL 01/03/2010   Acute maxillary sinusitis 04/02/2009   NAUSEA 02/09/2008   FLANK PAIN, LEFT 02/09/2008   Acute sinusitis, unspecified 10/19/2010   Ingrown left big toenail 07/28/2013   Body aches 04/06/2014   Viral pharyngitis 09/20/2014   Constipation 03/30/2015   Influenza A 06/06/2015   Stress at work 04/19/2016   Past Medical History:  Diagnosis Date   Depression    Headache(784.0)    History of recurrent UTIs    Irritable bowel syndrome    Kidney stones      ROS See HPI.    Objective:     BP 124/79   Pulse (!) 109   Ht 5\' 8"  (1.727 m)   Wt 149 lb (67.6 kg)   SpO2 99%   BMI 22.66 kg/m  BP Readings from Last 3 Encounters:  06/26/22 124/79  03/26/22 113/60  12/27/21 137/84   Wt Readings from Last 3 Encounters:  06/26/22 149 lb (67.6 kg)  03/26/22 152 lb 1.3 oz (69 kg)  12/27/21 156 lb (70.8 kg)    ..    06/26/2022    8:13 AM 03/26/2022    4:26 PM 03/26/2022    4:08 PM 12/27/2021    7:56 AM 11/21/2021    9:24 AM  Depression screen PHQ 2/9  Decreased Interest 0 0 0 0 0  Down, Depressed, Hopeless 0 0 0 1 0  PHQ - 2 Score 0 0 0 1 0  Altered sleeping 0 Tired, decreased energy Change in appetite  0  0 1  Feeling bad or failure about yourself  0 0  2 1  Trouble concentrating 0 0  0 0  Moving slowly or fidgety/restless 0 0  0 1   Suicidal thoughts 0 0  0 0  PHQ-9 Score Difficult doing work/chores Not difficult at all Somewhat difficult  Somewhat difficult Somewhat difficult   ..    06/26/2022    8:13 AM 03/26/2022    4:26 PM 12/27/2021    7:57 AM 11/21/2021    9:24 AM  GAD 7 : Generalized Anxiety Score  Nervous, Anxious, on Edge 0 Control/stop worrying 0 0 1 1  Worry too much - different things 0 0 2 1  Trouble relaxing Restless 0 0 0 0  Easily annoyed or irritable 0 0 2 1  Afraid - awful might happen 0 0 0 0  Total GAD 7 Score Anxiety Difficulty Not difficult at all Somewhat difficult Somewhat difficult Somewhat difficult      Physical Exam Constitutional:      Appearance: Normal appearance.  HENT:     Head: Normocephalic.     Comments: Bilateral TMJ tenderness to palpation.  Cardiovascular:     Rate and Rhythm: Normal rate and regular rhythm.     Pulses: Normal pulses.     Heart sounds: Normal heart sounds.  Pulmonary:     Effort: Pulmonary effort is normal.     Breath sounds: Normal breath sounds.  Neurological:     General: No focal deficit present.     Mental Status: She is alert and oriented to person, place, and time.  Psychiatric:        Mood and Affect: Mood normal.        Assessment & Plan:  Casey Sweeney Casey Sweeney was seen today for follow-up.  Diagnoses and all orders for this visit:  Attention deficit disorder (ADD) without hyperactivity -     lisdexamfetamine (VYVANSE) 40 MG capsule; Take 1 capsule (40 mg total) by mouth every morning. -     lisdexamfetamine (VYVANSE) 40 MG capsule; Take 1 capsule (40 mg total) by mouth every morning. -     lisdexamfetamine (VYVANSE) 40 MG capsule; Take 1 capsule (40 mg total) by mouth every morning.  Primary hypertension -     hydrochlorothiazide (HYDRODIURIL) 25 MG tablet; Take 1 tablet (25 mg total) by mouth daily.  Depressed mood  Teeth grinding -     cyclobenzaprine (FLEXERIL) 10 MG tablet; Take 1 tablet (10  mg total) by mouth 3 (three) times daily as needed. for muscle spams  TMJ dysfunction -     cyclobenzaprine (FLEXERIL) 10 MG tablet; Take 1 tablet (10 mg total) by mouth 3 (three) times daily as needed. for muscle spams   Vyvanse refilled for 3 months.  Continue wellbutrin and trintellix Vitals look good.  HCTZ refilled Discussed teeth clinching, continue to use mouth guard at night, prn use of flexeril, consider jaw massage Consider trintellix increase for overall anxiety Follow up in 3 months    Tandy Gaw, PA-C

## 2022-08-07 ENCOUNTER — Encounter: Payer: Self-pay | Admitting: Physician Assistant

## 2022-09-03 ENCOUNTER — Encounter: Payer: Self-pay | Admitting: Physician Assistant

## 2022-09-03 DIAGNOSIS — R4589 Other symptoms and signs involving emotional state: Secondary | ICD-10-CM

## 2022-09-03 MED ORDER — VORTIOXETINE HBR 5 MG PO TABS: 5.0000 mg | ORAL_TABLET | Freq: Every day | ORAL | 1 refills | Status: DC

## 2022-09-26 ENCOUNTER — Telehealth: Payer: PRIVATE HEALTH INSURANCE | Admitting: Physician Assistant

## 2022-09-26 ENCOUNTER — Encounter: Payer: Self-pay | Admitting: Physician Assistant

## 2022-09-26 VITALS — BP 107/70 | HR 113 | Ht 68.0 in | Wt 149.0 lb

## 2022-09-26 DIAGNOSIS — F411 Generalized anxiety disorder: Secondary | ICD-10-CM | POA: Diagnosis not present

## 2022-09-26 DIAGNOSIS — F988 Other specified behavioral and emotional disorders with onset usually occurring in childhood and adolescence: Secondary | ICD-10-CM

## 2022-09-26 DIAGNOSIS — R000 Tachycardia, unspecified: Secondary | ICD-10-CM

## 2022-09-26 DIAGNOSIS — I10 Essential (primary) hypertension: Secondary | ICD-10-CM | POA: Diagnosis not present

## 2022-09-26 DIAGNOSIS — F419 Anxiety disorder, unspecified: Secondary | ICD-10-CM | POA: Diagnosis not present

## 2022-09-26 DIAGNOSIS — F3341 Major depressive disorder, recurrent, in partial remission: Secondary | ICD-10-CM

## 2022-09-26 MED ORDER — LISDEXAMFETAMINE DIMESYLATE 40 MG PO CAPS
40.0000 mg | ORAL_CAPSULE | ORAL | 0 refills | Status: DC
Start: 2022-10-27 — End: 2022-12-14

## 2022-09-26 MED ORDER — LISDEXAMFETAMINE DIMESYLATE 40 MG PO CAPS
40.0000 mg | ORAL_CAPSULE | ORAL | 0 refills | Status: DC
Start: 2022-11-27 — End: 2022-12-14

## 2022-09-26 MED ORDER — CLONAZEPAM 0.5 MG PO TABS: 0.5000 mg | ORAL_TABLET | Freq: Two times a day (BID) | ORAL | 1 refills | Status: DC | PRN

## 2022-09-26 MED ORDER — LISDEXAMFETAMINE DIMESYLATE 40 MG PO CAPS: 40.0000 mg | ORAL_CAPSULE | ORAL | 0 refills | Status: DC

## 2022-09-26 MED ORDER — HYDROCHLOROTHIAZIDE 25 MG PO TABS: 25.0000 mg | ORAL_TABLET | Freq: Every day | ORAL | 1 refills | Status: DC

## 2022-09-26 NOTE — Progress Notes (Signed)
..Virtual Visit via Video Note  I connected with Casey Sweeney on 09/26/22 at  7:50 AM EDT by a video enabled telemedicine application and verified that I am speaking with the correct person using two identifiers.  Location: Patient: work Provider: clinic  .Marland KitchenParticipating in visit:  Patient: Casey Sweeney Provider: Tandy Gaw PA-C   I discussed the limitations of evaluation and management by telemedicine and the availability of in person appointments. The patient expressed understanding and agreed to proceed.  History of Present Illness: Pt is a 42 yo female who needs refills.   She is doing great on vyvanse and no issue with focus or job. Her mood is overall really good. She is still having some anxiety daily and using klonapin once a week or so. She is compliant with medication. She is very active. She is not smoking but still feels SOB with exercising. She wonders if this will get better. No cough or swelling. BP is staying great. HR goes up from time to time but mostly in response to anxiety. No CP. She is sleeping well.   .. Active Ambulatory Problems    Diagnosis Date Noted   Encounter for IUD insertion 02/04/2012   Anxiety 03/26/2012   Reactive depression 03/26/2012   Patellofemoral syndrome of left knee 09/16/2012   Chronic right shoulder pain 07/28/2013   Cubital tunnel syndrome on right 07/28/2013   Family hx-breast malignancy 04/06/2014   Breast tenderness in female 04/06/2014   Fullness of breast 04/06/2014   DDD (degenerative disc disease), cervical 03/22/2015   Currently attempting to quit smoking 03/30/2015   Generalized abdominal pain 03/30/2015   Capsulitis of metatarsophalangeal (MTP) joint of left foot 02/01/2016   Onychomycosis 02/17/2016   Stress at home 04/19/2016   Irritable bowel syndrome with both constipation and diarrhea 05/25/2016   GAD (generalized anxiety disorder) 12/17/2016   Inattention 12/17/2016   History of attention deficit hyperactivity  disorder (ADHD) 12/18/2016   ADD (attention deficit disorder) 02/20/2017   Obsessive behaviors 02/20/2017   Obsessive compulsive disorder 02/22/2017   Recurrent sinusitis 04/24/2017   Right sided facial pain 04/24/2017   Mastoid pain, right 04/24/2017   Chronic maxillary sinusitis 04/24/2017   Tachycardia 04/25/2017   Weight loss 11/18/2017   Left wrist pain 05/07/2018   Right elbow pain 05/07/2018   Primary osteoarthritis of both hands 09/03/2018   No energy 09/03/2018   Stress 02/04/2019   Elevated BP without diagnosis of hypertension 02/04/2019   Acute mid back pain 08/16/2020   Dysthymia 03/22/2021   Low libido 11/21/2021   Relationship problems 11/21/2021   Nausea 11/22/2021   Primary hypertension 03/27/2022   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   Teeth grinding 06/26/2022   Depressed mood 06/26/2022   Resolved Ambulatory Problems    Diagnosis Date Noted   PHARYNGITIS, STREPTOCOCCAL 01/03/2010   Acute maxillary sinusitis 04/02/2009   NAUSEA 02/09/2008   FLANK PAIN, LEFT 02/09/2008   Acute sinusitis, unspecified 10/19/2010   Ingrown left big toenail 07/28/2013   Body aches 04/06/2014   Viral pharyngitis 09/20/2014   Constipation 03/30/2015   Influenza A 06/06/2015   Stress at work 04/19/2016   Past Medical History:  Diagnosis Date   Depression    Headache(784.0)    History of recurrent UTIs    Irritable bowel syndrome    Kidney stones       Observations/Objective: No acute distress Normal mood and appearance Normal breathing  .Marland Kitchen Today's Vitals   09/26/22 0742  BP: 107/70  Pulse: (!) 113  Weight: 149 lb (67.6 kg)  Height: 5\' 8"  (1.727 m)   Body mass index is 22.66 kg/m.  ..    09/26/2022    7:40 AM 06/26/2022    8:13 AM 03/26/2022    4:26 PM 03/26/2022    4:08 PM 12/27/2021    7:56 AM  Depression screen PHQ 2/9  Decreased Interest 0 0 0 0 0  Down, Depressed, Hopeless 0 0 0 0 1  PHQ - 2 Score 0 0 0 0 1  Altered sleeping 1 0 1  1  Tired, decreased  energy 1 1 1  2   Change in appetite 0  0  0  Feeling bad or failure about yourself  0 0 0  2  Trouble concentrating 0 0 0  0  Moving slowly or fidgety/restless 0 0 0  0  Suicidal thoughts 0 0 0  0  PHQ-9 Score 2 1 2  6   Difficult doing work/chores Not difficult at all Not difficult at all Somewhat difficult  Somewhat difficult   .Marland Kitchen    09/26/2022    7:41 AM 06/26/2022    8:13 AM 03/26/2022    4:26 PM 12/27/2021    7:57 AM  GAD 7 : Generalized Anxiety Score  Nervous, Anxious, on Edge 1 0 2 2  Control/stop worrying 1 0 0 1  Worry too much - different things 1 0 0 2  Trouble relaxing 1 1 1 2   Restless 0 0 0 0  Easily annoyed or irritable 1 0 0 2  Afraid - awful might happen 0 0 0 0  Total GAD 7 Score 5 1 3 9   Anxiety Difficulty Not difficult at all Not difficult at all Somewhat difficult Somewhat difficult      Assessment and Plan: Marland KitchenMarland KitchenRobbie was seen today for follow-up.  Diagnoses and all orders for this visit:  GAD (generalized anxiety disorder) -     clonazePAM (KLONOPIN) 0.5 MG tablet; Take 1 tablet (0.5 mg total) by mouth 2 (two) times daily as needed for anxiety.  Anxiety -     clonazePAM (KLONOPIN) 0.5 MG tablet; Take 1 tablet (0.5 mg total) by mouth 2 (two) times daily as needed for anxiety.  Attention deficit disorder (ADD) without hyperactivity -     lisdexamfetamine (VYVANSE) 40 MG capsule; Take 1 capsule (40 mg total) by mouth every morning. -     lisdexamfetamine (VYVANSE) 40 MG capsule; Take 1 capsule (40 mg total) by mouth every morning. -     lisdexamfetamine (VYVANSE) 40 MG capsule; Take 1 capsule (40 mg total) by mouth every morning.  Primary hypertension -     hydrochlorothiazide (HYDRODIURIL) 25 MG tablet; Take 1 tablet (25 mg total) by mouth daily.  Recurrent major depressive disorder, in partial remission (HCC)  Tachycardia   BP looks great HR elevated- could be anxiety related Increased trintellix to 10mg  daily to see if makes difference Let me  know how doing in 4-6 weeks Discussed breathing technique Klonapin refilled for as needed Vyvanse refilled for 3 months SOB with exercise-come in for spirometry Not smoking Need in person visit for labs and spirometry in 3 months  Follow Up Instructions:    I discussed the assessment and treatment plan with the patient. The patient was provided an opportunity to ask questions and all were answered. The patient agreed with the plan and demonstrated an understanding of the instructions.   The patient was advised to call back or seek an in-person evaluation if the symptoms  worsen or if the condition fails to improve as anticipated.  Tandy Gaw, PA-C

## 2022-10-06 ENCOUNTER — Encounter: Payer: Self-pay | Admitting: Physician Assistant

## 2022-10-06 DIAGNOSIS — K582 Mixed irritable bowel syndrome: Secondary | ICD-10-CM

## 2022-10-08 MED ORDER — DICYCLOMINE HCL 10 MG PO CAPS: 10.0000 mg | ORAL_CAPSULE | Freq: Four times a day (QID) | ORAL | 1 refills | Status: DC | PRN

## 2022-11-02 ENCOUNTER — Ambulatory Visit: Payer: PRIVATE HEALTH INSURANCE | Admitting: Sports Medicine

## 2022-11-18 ENCOUNTER — Other Ambulatory Visit: Payer: Self-pay | Admitting: Physician Assistant

## 2022-11-18 DIAGNOSIS — I10 Essential (primary) hypertension: Secondary | ICD-10-CM

## 2022-11-24 ENCOUNTER — Encounter: Payer: Self-pay | Admitting: Physician Assistant

## 2022-12-14 ENCOUNTER — Encounter: Payer: Self-pay | Admitting: Physician Assistant

## 2022-12-14 ENCOUNTER — Telehealth (INDEPENDENT_AMBULATORY_CARE_PROVIDER_SITE_OTHER): Payer: PRIVATE HEALTH INSURANCE | Admitting: Physician Assistant

## 2022-12-14 DIAGNOSIS — F411 Generalized anxiety disorder: Secondary | ICD-10-CM

## 2022-12-14 DIAGNOSIS — F988 Other specified behavioral and emotional disorders with onset usually occurring in childhood and adolescence: Secondary | ICD-10-CM | POA: Diagnosis not present

## 2022-12-14 DIAGNOSIS — F329 Major depressive disorder, single episode, unspecified: Secondary | ICD-10-CM

## 2022-12-14 DIAGNOSIS — F419 Anxiety disorder, unspecified: Secondary | ICD-10-CM

## 2022-12-14 MED ORDER — LISDEXAMFETAMINE DIMESYLATE 40 MG PO CAPS
40.0000 mg | ORAL_CAPSULE | ORAL | 0 refills | Status: DC
Start: 2023-02-12 — End: 2023-03-11

## 2022-12-14 MED ORDER — BUPROPION HCL ER (XL) 150 MG PO TB24
150.0000 mg | ORAL_TABLET | ORAL | 3 refills | Status: DC
Start: 2022-12-14 — End: 2023-03-11

## 2022-12-14 MED ORDER — LISDEXAMFETAMINE DIMESYLATE 40 MG PO CAPS
40.0000 mg | ORAL_CAPSULE | ORAL | 0 refills | Status: DC
Start: 2022-12-14 — End: 2023-03-11

## 2022-12-14 MED ORDER — LISDEXAMFETAMINE DIMESYLATE 40 MG PO CAPS
40.0000 mg | ORAL_CAPSULE | ORAL | 0 refills | Status: DC
Start: 2023-01-13 — End: 2023-03-11

## 2022-12-14 NOTE — Progress Notes (Signed)
Pt would like refills on vyvanse

## 2022-12-14 NOTE — Progress Notes (Signed)
..Virtual Visit via Video Note  I connected with Casey Sweeney on 12/14/22 at 11:10 AM EDT by a video enabled telemedicine application and verified that I am speaking with the correct person using two identifiers.  Location: Patient: work Provider: clinic  .Marland KitchenParticipating in visit:  Patient: Casey Sweeney Provider: Tandy Gaw PA-C   I discussed the limitations of evaluation and management by telemedicine and the availability of in person appointments. The patient expressed understanding and agreed to proceed.  History of Present Illness: Pt is a 42 yo female with ADD, GAD, MDD who presents to the clinic for medication refills. She is doing well. No concern with mood. Focus is good when taking medication. Needs refills.    .. Active Ambulatory Problems    Diagnosis Date Noted   Encounter for IUD insertion 02/04/2012   Anxiety 03/26/2012   Reactive depression 03/26/2012   Patellofemoral syndrome of left knee 09/16/2012   Chronic right shoulder pain 07/28/2013   Cubital tunnel syndrome on right 07/28/2013   Family hx-breast malignancy 04/06/2014   Breast tenderness in female 04/06/2014   Fullness of breast 04/06/2014   DDD (degenerative disc disease), cervical 03/22/2015   Currently attempting to quit smoking 03/30/2015   Generalized abdominal pain 03/30/2015   Capsulitis of metatarsophalangeal (MTP) joint of left foot 02/01/2016   Onychomycosis 02/17/2016   Stress at home 04/19/2016   Irritable bowel syndrome with both constipation and diarrhea 05/25/2016   GAD (generalized anxiety disorder) 12/17/2016   Inattention 12/17/2016   History of attention deficit hyperactivity disorder (ADHD) 12/18/2016   ADD (attention deficit disorder) 02/20/2017   Obsessive behaviors 02/20/2017   Obsessive compulsive disorder 02/22/2017   Recurrent sinusitis 04/24/2017   Right sided facial pain 04/24/2017   Mastoid pain, right 04/24/2017   Chronic maxillary sinusitis 04/24/2017   Tachycardia  04/25/2017   Weight loss 11/18/2017   Left wrist pain 05/07/2018   Right elbow pain 05/07/2018   Primary osteoarthritis of both hands 09/03/2018   No energy 09/03/2018   Stress 02/04/2019   Elevated BP without diagnosis of hypertension 02/04/2019   Acute mid back pain 08/16/2020   Dysthymia 03/22/2021   Low libido 11/21/2021   Relationship problems 11/21/2021   Nausea 11/22/2021   Primary hypertension 03/27/2022   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   Teeth grinding 06/26/2022   Depressed mood 06/26/2022   Resolved Ambulatory Problems    Diagnosis Date Noted   PHARYNGITIS, STREPTOCOCCAL 01/03/2010   Acute maxillary sinusitis 04/02/2009   NAUSEA 02/09/2008   FLANK PAIN, LEFT 02/09/2008   Acute sinusitis, unspecified 10/19/2010   Ingrown left big toenail 07/28/2013   Body aches 04/06/2014   Viral pharyngitis 09/20/2014   Constipation 03/30/2015   Influenza A 06/06/2015   Stress at work 04/19/2016   Past Medical History:  Diagnosis Date   Depression    Headache(784.0)    History of recurrent UTIs    Irritable bowel syndrome    Kidney stones     Observations/Objective: No acute distress Normal breathing  Normal mood and appearance  .Marland Kitchen Today's Vitals   12/14/22 1102  BP: 128/80  Weight: 145 lb (65.8 kg)   Body mass index is 22.05 kg/m.    Assessment and Plan: Marland KitchenMarland KitchenMackenzy was seen today for medical management of chronic issues.  Diagnoses and all orders for this visit:  Attention deficit disorder (ADD) without hyperactivity -     lisdexamfetamine (VYVANSE) 40 MG capsule; Take 1 capsule (40 mg total) by mouth every morning. -  lisdexamfetamine (VYVANSE) 40 MG capsule; Take 1 capsule (40 mg total) by mouth every morning. -     lisdexamfetamine (VYVANSE) 40 MG capsule; Take 1 capsule (40 mg total) by mouth every morning.  Reactive depression -     buPROPion (WELLBUTRIN XL) 150 MG 24 hr tablet; Take 1 tablet (150 mg total) by mouth every morning.  Anxiety -      buPROPion (WELLBUTRIN XL) 150 MG 24 hr tablet; Take 1 tablet (150 mg total) by mouth every morning.  GAD (generalized anxiety disorder) -     buPROPion (WELLBUTRIN XL) 150 MG 24 hr tablet; Take 1 tablet (150 mg total) by mouth every morning.   No concerns Doing well Refilled medications   Follow Up Instructions:    I discussed the assessment and treatment plan with the patient. The patient was provided an opportunity to ask questions and all were answered. The patient agreed with the plan and demonstrated an understanding of the instructions.   The patient was advised to call back or seek an in-person evaluation if the symptoms worsen or if the condition fails to improve as anticipated.  Tandy Gaw, PA-C

## 2023-01-20 ENCOUNTER — Other Ambulatory Visit: Payer: Self-pay | Admitting: Physician Assistant

## 2023-01-20 DIAGNOSIS — F988 Other specified behavioral and emotional disorders with onset usually occurring in childhood and adolescence: Secondary | ICD-10-CM

## 2023-01-30 ENCOUNTER — Encounter: Payer: Self-pay | Admitting: Physician Assistant

## 2023-01-30 DIAGNOSIS — F411 Generalized anxiety disorder: Secondary | ICD-10-CM

## 2023-01-30 DIAGNOSIS — F419 Anxiety disorder, unspecified: Secondary | ICD-10-CM

## 2023-02-01 MED ORDER — CLONAZEPAM 0.5 MG PO TABS
0.5000 mg | ORAL_TABLET | Freq: Two times a day (BID) | ORAL | 1 refills | Status: DC | PRN
Start: 2023-02-01 — End: 2023-03-12

## 2023-03-11 ENCOUNTER — Ambulatory Visit (INDEPENDENT_AMBULATORY_CARE_PROVIDER_SITE_OTHER): Payer: PRIVATE HEALTH INSURANCE | Admitting: Physician Assistant

## 2023-03-11 VITALS — BP 134/76 | HR 108 | Resp 12 | Ht 68.0 in | Wt 154.4 lb

## 2023-03-11 DIAGNOSIS — R4589 Other symptoms and signs involving emotional state: Secondary | ICD-10-CM

## 2023-03-11 DIAGNOSIS — I10 Essential (primary) hypertension: Secondary | ICD-10-CM

## 2023-03-11 DIAGNOSIS — F988 Other specified behavioral and emotional disorders with onset usually occurring in childhood and adolescence: Secondary | ICD-10-CM | POA: Diagnosis not present

## 2023-03-11 DIAGNOSIS — F411 Generalized anxiety disorder: Secondary | ICD-10-CM

## 2023-03-11 DIAGNOSIS — F419 Anxiety disorder, unspecified: Secondary | ICD-10-CM

## 2023-03-11 MED ORDER — LISDEXAMFETAMINE DIMESYLATE 40 MG PO CAPS: 40.0000 mg | ORAL_CAPSULE | ORAL | 0 refills | Status: DC

## 2023-03-11 MED ORDER — VORTIOXETINE HBR 10 MG PO TABS: 10.0000 mg | ORAL_TABLET | Freq: Every day | ORAL | 1 refills | Status: DC

## 2023-03-11 MED ORDER — HYDROCHLOROTHIAZIDE 25 MG PO TABS
25.0000 mg | ORAL_TABLET | Freq: Every day | ORAL | 1 refills | Status: DC
Start: 2023-03-11 — End: 2023-10-23

## 2023-03-11 NOTE — Patient Instructions (Signed)
Stop wellbutrin, increase trintellix to 10mg  daily

## 2023-03-12 ENCOUNTER — Encounter: Payer: Self-pay | Admitting: Physician Assistant

## 2023-03-12 MED ORDER — CLONAZEPAM 0.5 MG PO TABS: 0.5000 mg | ORAL_TABLET | Freq: Two times a day (BID) | ORAL | 2 refills | Status: DC | PRN

## 2023-04-04 ENCOUNTER — Encounter: Payer: Self-pay | Admitting: Physician Assistant

## 2023-06-05 ENCOUNTER — Encounter: Payer: Self-pay | Admitting: Physician Assistant

## 2023-06-05 ENCOUNTER — Ambulatory Visit (INDEPENDENT_AMBULATORY_CARE_PROVIDER_SITE_OTHER): Payer: PRIVATE HEALTH INSURANCE | Admitting: Physician Assistant

## 2023-06-05 VITALS — BP 121/69 | HR 69 | Ht 68.0 in | Wt 141.0 lb

## 2023-06-05 DIAGNOSIS — R110 Nausea: Secondary | ICD-10-CM | POA: Diagnosis not present

## 2023-06-05 DIAGNOSIS — F411 Generalized anxiety disorder: Secondary | ICD-10-CM | POA: Diagnosis not present

## 2023-06-05 DIAGNOSIS — F988 Other specified behavioral and emotional disorders with onset usually occurring in childhood and adolescence: Secondary | ICD-10-CM | POA: Diagnosis not present

## 2023-06-05 DIAGNOSIS — F419 Anxiety disorder, unspecified: Secondary | ICD-10-CM | POA: Diagnosis not present

## 2023-06-05 MED ORDER — LISDEXAMFETAMINE DIMESYLATE 40 MG PO CAPS
40.0000 mg | ORAL_CAPSULE | ORAL | 0 refills | Status: DC
Start: 2023-08-04 — End: 2023-10-15

## 2023-06-05 MED ORDER — CLONAZEPAM 0.5 MG PO TABS: 0.5000 mg | ORAL_TABLET | Freq: Two times a day (BID) | ORAL | 2 refills | Status: DC | PRN

## 2023-06-05 MED ORDER — OMEPRAZOLE 40 MG PO CPDR: 40.0000 mg | DELAYED_RELEASE_CAPSULE | Freq: Every day | ORAL | 3 refills | Status: AC

## 2023-06-05 MED ORDER — LISDEXAMFETAMINE DIMESYLATE 40 MG PO CAPS
40.0000 mg | ORAL_CAPSULE | ORAL | 0 refills | Status: DC
Start: 2023-07-05 — End: 2023-10-23

## 2023-06-05 MED ORDER — LISDEXAMFETAMINE DIMESYLATE 40 MG PO CAPS: 40.0000 mg | ORAL_CAPSULE | ORAL | 0 refills | Status: DC

## 2023-06-05 NOTE — Progress Notes (Unsigned)
 Established Patient Office Visit  Subjective   Patient ID: Casey Sweeney, female    DOB: 1981/01/15  Age: 43 y.o. MRN: [id number]  Chief Complaint  Patient presents with   Medical Management of Chronic Issues    Vyvanse refills    HPI Pt is a 43 yo female who presents to the clinic for ADHD refills on vyvanse. She is doing great with focus and work. Her mood and production is wonderful. Her BP is great. She only has nausea in the mornings. If she takes klonapin it resolves. No vomiting. No melena, hematochezia or bowel changes. No abdominal pain.   Review of Systems  All other systems reviewed and are negative.     Objective:     BP 121/69   Pulse 69   Ht 5\' 8"  (1.727 m)   Wt 141 lb (64 kg)   SpO2 99%   BMI 21.44 kg/m  BP Readings from Last 3 Encounters:  06/05/23 121/69  03/11/23 134/76  12/14/22 128/80   Wt Readings from Last 3 Encounters:  06/05/23 141 lb (64 kg)  03/11/23 154 lb 6.4 oz (70 kg)  12/14/22 145 lb (65.8 kg)    ..    06/05/2023    3:30 PM 03/12/2023    7:32 AM 12/14/2022   11:00 AM 09/26/2022    7:40 AM 06/26/2022    8:13 AM  Depression screen PHQ 2/9  Decreased Interest 0 0 0 0 0  Down, Depressed, Hopeless 1 0 1 0 0  PHQ - 2 Score 1 0 1 0 0  Altered sleeping 1 0 1 1 0  Tired, decreased energy 1 1 0 1 1  Change in appetite 0 0 0 0   Feeling bad or failure about yourself  1 0 0 0 0  Trouble concentrating 0 0 0 0 0  Moving slowly or fidgety/restless 0 0 0 0 0  Suicidal thoughts 0 0 0 0 0  PHQ-9 Score 4 1 2 2 1   Difficult doing work/chores Not difficult at all Not difficult at all Somewhat difficult Not difficult at all Not difficult at all   ..    06/05/2023    3:30 PM 03/12/2023    7:32 AM 12/14/2022   11:01 AM 09/26/2022    7:41 AM  GAD 7 : Generalized Anxiety Score  Nervous, Anxious, on Edge 0 1 1 1   Control/stop worrying 0 1 0 1  Worry too much - different things 0 1 1 1   Trouble relaxing 1 1 0 1  Restless 0 1 0 0  Easily  annoyed or irritable 1 1 1 1   Afraid - awful might happen 0 1 0 0  Total GAD 7 Score 2 7 3 5   Anxiety Difficulty Not difficult at all Somewhat difficult Not difficult at all Not difficult at all      Physical Exam Constitutional:      Appearance: Normal appearance.  Cardiovascular:     Rate and Rhythm: Normal rate and regular rhythm.  Pulmonary:     Effort: Pulmonary effort is normal.     Breath sounds: Normal breath sounds.  Abdominal:     General: There is no distension.     Palpations: Abdomen is soft. There is no mass.     Tenderness: There is no abdominal tenderness. There is no guarding or rebound.  Neurological:     General: No focal deficit present.     Mental Status: She is alert and oriented to person,  place, and time.  Psychiatric:        Mood and Affect: Mood normal.      The 10-year ASCVD risk score (Arnett DK, et al., 2019) is: 0.7%    Assessment & Plan:  Marland KitchenMarland KitchenMalicia was seen today for medical management of chronic issues.  Diagnoses and all orders for this visit:  Attention deficit disorder (ADD) without hyperactivity -     lisdexamfetamine (VYVANSE) 40 MG capsule; Take 1 capsule (40 mg total) by mouth every morning. -     lisdexamfetamine (VYVANSE) 40 MG capsule; Take 1 capsule (40 mg total) by mouth every morning. -     lisdexamfetamine (VYVANSE) 40 MG capsule; Take 1 capsule (40 mg total) by mouth every morning.  Anxiety -     clonazePAM (KLONOPIN) 0.5 MG tablet; Take 1 tablet (0.5 mg total) by mouth 2 (two) times daily as needed for anxiety.  GAD (generalized anxiety disorder) -     clonazePAM (KLONOPIN) 0.5 MG tablet; Take 1 tablet (0.5 mg total) by mouth 2 (two) times daily as needed for anxiety.  Nausea -     omeprazole (PRILOSEC) 40 MG capsule; Take 1 capsule (40 mg total) by mouth daily.   Vitals look great Pt does not want to change vyvanse because it is doing so well Nausea is just in the morning and klonapin does improve symptoms Lets add  omeprazole and see if that helps with nausea in the morning Refilled klonapin Follow up in 6 months    Return in about 6 months (around 12/06/2023).    Tandy Gaw, PA-C

## 2023-06-07 ENCOUNTER — Encounter: Payer: Self-pay | Admitting: Physician Assistant

## 2023-10-08 ENCOUNTER — Telehealth: Payer: Self-pay | Admitting: Physician Assistant

## 2023-10-08 NOTE — Telephone Encounter (Signed)
 Copied from CRM #[id number]. Topic: Appointments - Scheduling Inquiry for Clinic >> Oct 08, 2023 12:18 PM Carmell R wrote: Reason for CRM: Pt needs an OV for med refills but nothing available until 8/13. She needs a sooner appt as this is to fill her lisdexamfetamine (VYVANSE ) 40 MG capsule. Please contact pt. Please advise

## 2023-10-11 ENCOUNTER — Other Ambulatory Visit: Payer: Self-pay | Admitting: Physician Assistant

## 2023-10-11 DIAGNOSIS — F419 Anxiety disorder, unspecified: Secondary | ICD-10-CM

## 2023-10-11 DIAGNOSIS — F411 Generalized anxiety disorder: Secondary | ICD-10-CM

## 2023-10-11 DIAGNOSIS — R4589 Other symptoms and signs involving emotional state: Secondary | ICD-10-CM

## 2023-10-15 ENCOUNTER — Encounter: Payer: Self-pay | Admitting: Physician Assistant

## 2023-10-15 DIAGNOSIS — F988 Other specified behavioral and emotional disorders with onset usually occurring in childhood and adolescence: Secondary | ICD-10-CM

## 2023-10-15 MED ORDER — LISDEXAMFETAMINE DIMESYLATE 40 MG PO CAPS: 40.0000 mg | ORAL_CAPSULE | ORAL | 0 refills | Status: DC

## 2023-10-15 NOTE — Telephone Encounter (Signed)
 Patient requesting rx rf of Vyvanse  40mg   Last written 08/04/2023 Last OV 06/05/2023 Upcoming appt 10/23/2023 ( but patient states she does not have medication to last until this appointment)

## 2023-10-15 NOTE — Telephone Encounter (Signed)
 Vyvanse  sent to pharmacy to get filled before appt.

## 2023-10-23 ENCOUNTER — Encounter: Payer: Self-pay | Admitting: Physician Assistant

## 2023-10-23 ENCOUNTER — Telehealth: Payer: PRIVATE HEALTH INSURANCE | Admitting: Physician Assistant

## 2023-10-23 VITALS — BP 116/72 | HR 88

## 2023-10-23 DIAGNOSIS — F419 Anxiety disorder, unspecified: Secondary | ICD-10-CM

## 2023-10-23 DIAGNOSIS — I10 Essential (primary) hypertension: Secondary | ICD-10-CM

## 2023-10-23 DIAGNOSIS — F411 Generalized anxiety disorder: Secondary | ICD-10-CM

## 2023-10-23 DIAGNOSIS — F988 Other specified behavioral and emotional disorders with onset usually occurring in childhood and adolescence: Secondary | ICD-10-CM

## 2023-10-23 DIAGNOSIS — F3341 Major depressive disorder, recurrent, in partial remission: Secondary | ICD-10-CM | POA: Insufficient documentation

## 2023-10-23 DIAGNOSIS — R4589 Other symptoms and signs involving emotional state: Secondary | ICD-10-CM

## 2023-10-23 MED ORDER — LISDEXAMFETAMINE DIMESYLATE 40 MG PO CAPS: 40.0000 mg | ORAL_CAPSULE | ORAL | 0 refills | Status: DC

## 2023-10-23 MED ORDER — VORTIOXETINE HBR 10 MG PO TABS: 10.0000 mg | ORAL_TABLET | Freq: Every day | ORAL | 1 refills | Status: AC

## 2023-10-23 NOTE — Progress Notes (Signed)
..  Virtual Visit via Video Note  I connected with Casey Sweeney on 10/23/23 at  9:10 AM EDT by a video enabled telemedicine application and verified that I am speaking with the correct person using two identifiers.  Location: Patient: work Provider: clinic  .SABRAParticipating in visit:  Patient: Casey Sweeney Provider: Vermell Bologna PA-C   I discussed the limitations of evaluation and management by telemedicine and the availability of in person appointments. The patient expressed understanding and agreed to proceed.  History of Present Illness: Pt is a 43 yo female with ADHD, HTN, GAD, MDD who presents to the clinic for follow up and medication refills.   She is doing well. Her kids are doing well. Her mother is struggling some with cancer treatment. She has a new job with less stress. She denies any increase in anxiety or depression. She denies any SI/HC. She is sleeping well. Needs refills on vyvanse .   She was having some leg cramping and hypotension symptoms. She stopped HcTZ. She has been checking BP and running good 110-120s over 70-80s.     Observations/Objective: No acute distress Normal mood and appearance   Assessment and Plan: SABRASABRASyrita was seen today for medical management of chronic issues.  Diagnoses and all orders for this visit:  Attention deficit disorder (ADD) without hyperactivity -     lisdexamfetamine (VYVANSE ) 40 MG capsule; Take 1 capsule (40 mg total) by mouth every morning. -     lisdexamfetamine (VYVANSE ) 40 MG capsule; Take 1 capsule (40 mg total) by mouth every morning. -     lisdexamfetamine (VYVANSE ) 40 MG capsule; Take 1 capsule (40 mg total) by mouth every morning.  GAD (generalized anxiety disorder) -     vortioxetine  HBr (TRINTELLIX ) 10 MG TABS tablet; Take 1 tablet (10 mg total) by mouth daily.  Primary hypertension  Recurrent major depressive disorder, in partial remission (HCC)  Depressed mood -     vortioxetine  HBr (TRINTELLIX ) 10 MG TABS  tablet; Take 1 tablet (10 mg total) by mouth daily.  Anxiety -     vortioxetine  HBr (TRINTELLIX ) 10 MG TABS tablet; Take 1 tablet (10 mg total) by mouth daily.   Pt is doing really good Vitals look great off HcTZ .SABRAPDMP reviewed during this encounter. No concerns Vyvanse  refilled Trintellix  refilled Follow up in 6 months Next visit should be in person    Follow Up Instructions:    I discussed the assessment and treatment plan with the patient. The patient was provided an opportunity to ask questions and all were answered. The patient agreed with the plan and demonstrated an understanding of the instructions.   The patient was advised to call back or seek an in-person evaluation if the symptoms worsen or if the condition fails to improve as anticipated.   Mahek Schlesinger, PA-C

## 2023-11-12 ENCOUNTER — Encounter: Payer: Self-pay | Admitting: Sports Medicine

## 2023-12-15 ENCOUNTER — Other Ambulatory Visit: Payer: Self-pay | Admitting: Physician Assistant

## 2023-12-15 DIAGNOSIS — K582 Mixed irritable bowel syndrome: Secondary | ICD-10-CM

## 2024-01-18 ENCOUNTER — Other Ambulatory Visit: Payer: Self-pay | Admitting: Physician Assistant

## 2024-01-18 DIAGNOSIS — F419 Anxiety disorder, unspecified: Secondary | ICD-10-CM

## 2024-01-18 DIAGNOSIS — F411 Generalized anxiety disorder: Secondary | ICD-10-CM

## 2024-02-17 ENCOUNTER — Ambulatory Visit: Payer: PRIVATE HEALTH INSURANCE | Admitting: Physician Assistant

## 2024-02-17 VITALS — BP 121/70 | HR 101 | Ht 68.0 in | Wt 157.0 lb

## 2024-02-17 DIAGNOSIS — F4321 Adjustment disorder with depressed mood: Secondary | ICD-10-CM

## 2024-02-17 DIAGNOSIS — F3341 Major depressive disorder, recurrent, in partial remission: Secondary | ICD-10-CM

## 2024-02-17 DIAGNOSIS — F411 Generalized anxiety disorder: Secondary | ICD-10-CM

## 2024-02-17 DIAGNOSIS — I10 Essential (primary) hypertension: Secondary | ICD-10-CM

## 2024-02-17 DIAGNOSIS — F988 Other specified behavioral and emotional disorders with onset usually occurring in childhood and adolescence: Secondary | ICD-10-CM

## 2024-02-17 DIAGNOSIS — N951 Menopausal and female climacteric states: Secondary | ICD-10-CM

## 2024-02-17 DIAGNOSIS — R6882 Decreased libido: Secondary | ICD-10-CM

## 2024-02-17 DIAGNOSIS — Z13220 Encounter for screening for lipoid disorders: Secondary | ICD-10-CM

## 2024-02-17 DIAGNOSIS — Z0000 Encounter for general adult medical examination without abnormal findings: Secondary | ICD-10-CM

## 2024-02-17 MED ORDER — LISDEXAMFETAMINE DIMESYLATE 30 MG PO CAPS: 30.0000 mg | ORAL_CAPSULE | Freq: Every day | ORAL | 0 refills | Status: DC

## 2024-02-17 NOTE — Progress Notes (Unsigned)
 Complete physical exam  Patient: Casey Sweeney   DOB: August 18, 1980   43 y.o. Female  MRN: [id number]  Subjective:    Chief Complaint  Patient presents with   Medical Management of Chronic Issues   Discussed the use of AI scribe software for clinical note transcription with the patient, who gave verbal consent to proceed.  History of Present Illness Casey Sweeney is a 43 year old female who presents to the clinic for annual physical.   Vasomotor symptoms and fatigue - Frequent hot flashes, including nocturnal episodes resulting in waking up drenched despite maintaining a cool environment - Persistent fatigue  Decreased libido and sexual dysfunction - Low libido, with lack of interest in initiating sexual activity - Able to enjoy sexual activity once it begins - Significant impact on marital relationship - Feels pressured by husband, contributing to anxiety and reluctance - Longstanding issue  Psychological stress and grief - Recent bereavement following mother's death from hemorrhagic stroke after one week in hospice care - Emotional impact of loss and challenges managing grief while maintaining daily responsibilities  Antihypertensive medication side effects and adherence - History of hypertension managed with hydrochlorothiazide  - Discontinued hydrochlorothiazide  previously due to calf cramps, but resumed three months ago after blood pressure increased - Dislikes frequent urination associated with hydrochlorothiazide   Medication use and concerns - Currently taking Vyvanse  and Trintellix  - Did not take Vyvanse  over the weekend - Considering dosage adjustment of Vyvanse  due to changes in job and stress levels - Concerned about potential impact of medications on hot flashes and libido    Most recent fall risk assessment:    02/17/2024    3:17 PM  Fall Risk   Falls in the past year? 0  Number falls in past yr: 0  Injury with Fall? 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   Teeth grinding 06/26/2022   Depressed mood 06/26/2022   Primary hypertension 03/27/2022   Nausea 11/22/2021   Low libido 11/21/2021   Relationship problems 11/21/2021   Dysthymia 03/22/2021   Acute mid back pain 08/16/2020   Stress 02/04/2019   Elevated BP without diagnosis of hypertension 02/04/2019   Primary osteoarthritis of both hands 09/03/2018   No energy 09/03/2018   Left wrist pain 05/07/2018   Right elbow pain 05/07/2018   Weight loss 11/18/2017   Tachycardia 04/25/2017   Recurrent sinusitis 04/24/2017   Right sided facial pain 04/24/2017   Mastoid pain, right 04/24/2017   Chronic maxillary sinusitis 04/24/2017   Obsessive compulsive disorder 02/22/2017   ADD (attention deficit disorder) 02/20/2017   Obsessive behaviors 02/20/2017   History of attention deficit hyperactivity disorder (ADHD) 12/18/2016   GAD (generalized anxiety disorder) 12/17/2016   Inattention 12/17/2016   Irritable bowel syndrome with both constipation and diarrhea 05/25/2016   Stress at home 04/19/2016   Onychomycosis 02/17/2016   Capsulitis  of metatarsophalangeal (MTP) joint of left foot 02/01/2016   Currently attempting to quit smoking 03/30/2015   Generalized abdominal pain 03/30/2015   DDD (degenerative disc disease), cervical 03/22/2015   Family hx-breast malignancy 04/06/2014   Breast tenderness in female 04/06/2014   Fullness of breast 04/06/2014   Chronic  right shoulder pain 07/28/2013   Cubital tunnel syndrome on right 07/28/2013   Patellofemoral syndrome of left knee 09/16/2012   Anxiety 03/26/2012   Reactive depression 03/26/2012   Encounter for IUD insertion 02/04/2012   Past Medical History:  Diagnosis Date   Anxiety    Depression    Headache(784.0)    migraines   History of recurrent UTIs    Irritable bowel syndrome    Kidney stones    Past Surgical History:  Procedure Laterality Date   CHOLECYSTECTOMY  10/17/12   lap chole   CHOLECYSTECTOMY N/A 10/17/2012   Procedure: LAPAROSCOPIC CHOLECYSTECTOMY;  Surgeon: Lynda Leos, MD;  Location: [facility] OR;  Service: General;  Laterality: N/A;   NO PAST SURGERIES     SHOULDER SURGERY Right    Family History  Problem Relation Age of Onset   Cancer Other 9       unknown cancer   Hypertension Mother    Aortic aneurysm Mother    Myelodysplastic syndrome Mother    Lung cancer Father        lung cancer   Cancer Paternal Aunt        breast   Cancer Maternal Grandfather        unknown   Stomach cancer Maternal Uncle    Cancer Paternal Aunt    Allergies  Allergen Reactions   Lexapro  [Escitalopram  Oxalate]     Worsening depression.    Strattera  [Atomoxetine  Hcl]     Heart burn      Patient Care Team: Filemon Breton L, PA-C as PCP - General (Family Medicine)   Outpatient Medications Prior to Visit  Medication Sig   clonazePAM  (KLONOPIN ) 0.5 MG tablet Take 1 tablet (0.5 mg total) by mouth 2 (two) times daily as needed for anxiety.   cyclobenzaprine  (FLEXERIL ) 10 MG tablet Take 1 tablet (10 mg total) by mouth 3 (three) times daily as needed. for muscle spams   dicyclomine  (BENTYL ) 10 MG capsule Take 1 capsule (10 mg total) by mouth 4 (four) times daily as needed for spasms.   diphenhydrAMINE (BENADRYL) 25 mg capsule Take by mouth.   fexofenadine (ALLEGRA) 180 MG tablet Take 180 mg by mouth daily.   omeprazole  (PRILOSEC) 40 MG capsule Take 1 capsule (40 mg total) by mouth daily.    vortioxetine  HBr (TRINTELLIX ) 10 MG TABS tablet Take 1 tablet (10 mg total) by mouth daily.   [DISCONTINUED] lisdexamfetamine (VYVANSE ) 40 MG capsule Take 1 capsule (40 mg total) by mouth every morning.   [DISCONTINUED] lisdexamfetamine (VYVANSE ) 40 MG capsule Take 1 capsule (40 mg total) by mouth every morning.   [DISCONTINUED] lisdexamfetamine (VYVANSE ) 40 MG capsule Take 1 capsule (40 mg total) by mouth every morning.   No facility-administered medications prior to visit.    Review of Systems  All other systems reviewed and are negative.         Objective:     BP 121/70   Pulse (!) 101   Ht 5' 8 (1.727 m)   Wt 157 lb (71.2 kg)   SpO2 99%   BMI 23.87 kg/m  BP Readings from Last 3 Encounters:  02/17/24 121/70  10/23/23 116/72  06/05/23 121/69  Wt Readings from Last 3 Encounters:  02/17/24 157 lb (71.2 kg)  06/05/23 141 lb (64 kg)  03/11/23 154 lb 6.4 oz (70 kg)      Physical Exam  BP 121/70   Pulse (!) 101   Ht 5' 8 (1.727 m)   Wt 157 lb (71.2 kg)   SpO2 99%   BMI 23.87 kg/m   General Appearance:    Alert, cooperative, no distress, appears stated age  Head:    Normocephalic, without obvious abnormality, atraumatic  Eyes:    PERRL, conjunctiva/corneas clear, EOM's intact, fundi    benign, both eyes  Ears:    Normal TM's and external ear canals, both ears  Nose:   Nares normal, septum midline, mucosa normal, no drainage    or sinus tenderness  Throat:   Lips, mucosa, and tongue normal; teeth and gums normal  Neck:   Supple, symmetrical, trachea midline, no adenopathy;    thyroid :  no enlargement/tenderness/nodules; no carotid   bruit or JVD  Back:     Symmetric, no curvature, ROM normal, no CVA tenderness  Lungs:     Clear to auscultation bilaterally, respirations unlabored  Chest Wall:    No tenderness or deformity   Heart:    Regular rate and rhythm, S1 and S2 normal, no murmur, rub   or gallop     Abdomen:     Soft, non-tender, bowel sounds active  all four quadrants,    no masses, no organomegaly        Extremities:   Extremities normal, atraumatic, no cyanosis or edema  Pulses:   2+ and symmetric all extremities  Skin:   Skin color, texture, turgor normal, no rashes or lesions  Lymph nodes:   Cervical, supraclavicular, and axillary nodes normal  Neurologic:   CNII-XII intact, normal strength, sensation and reflexes    throughout       Assessment & Plan:    Routine Health Maintenance and Physical Exam  Immunization History  Administered Date(s) Administered   Influenza,inj,Quad PF,6+ Mos 12/17/2016, 11/18/2017, 11/13/2018   Tdap 05/24/2015    Health Maintenance  Topic Date Due   Influenza Vaccine  06/09/2024 (Originally 10/11/2023)   Hepatitis B Vaccines 19-59 Average Risk (1 of 3 - 19+ 3-dose series) 02/16/2025 (Originally 01/18/2002)   Mammogram  02/16/2025 (Originally 01/19/2023)   HPV VACCINES (1 - 3-dose SCDM series) 02/16/2025 (Originally 01/18/2010)   Hepatitis C Screening  02/16/2025 (Originally 01/18/2001)   DTaP/Tdap/Td (2 - Td or Tdap) 05/23/2025   Cervical Cancer Screening (HPV/Pap Cotest)  02/09/2027   HIV Screening  Completed   Pneumococcal Vaccine  Aged Out   Meningococcal B Vaccine  Aged Out   COVID-19 Vaccine  Discontinued    Discussed health benefits of physical activity, and encouraged her to engage in regular exercise appropriate for her age and condition. Casey Sweeney was seen today for medical management of chronic issues.  Diagnoses and all orders for this visit:  Routine physical examination -     Lipid panel -     CMP14+EGFR -     TSH + free T4 -     CBC w/Diff/Platelet -     Fe+TIBC+Fer -     B12 and Folate Panel -     VITAMIN D  25 Hydroxy (Vit-D Deficiency, Fractures) -     Estradiol -     FSH/LH -     Progesterone -     Testosterone, Free, Total, SHBG  Perimenopausal -  Lipid panel -     CMP14+EGFR -     TSH + free T4 -     CBC w/Diff/Platelet -     Fe+TIBC+Fer -     B12 and  Folate Panel -     VITAMIN D  25 Hydroxy (Vit-D Deficiency, Fractures) -     Estradiol -     FSH/LH -     Progesterone -     Testosterone, Free, Total, SHBG  Low libido -     Lipid panel -     CMP14+EGFR -     TSH + free T4 -     CBC w/Diff/Platelet -     Fe+TIBC+Fer -     B12 and Folate Panel -     VITAMIN D  25 Hydroxy (Vit-D Deficiency, Fractures) -     Estradiol -     FSH/LH -     Progesterone -     Testosterone, Free, Total, SHBG  Recurrent major depressive disorder, in partial remission -     Lipid panel -     CMP14+EGFR -     TSH + free T4 -     CBC w/Diff/Platelet -     Fe+TIBC+Fer -     B12 and Folate Panel -     VITAMIN D  25 Hydroxy (Vit-D Deficiency, Fractures) -     Estradiol -     FSH/LH -     Progesterone -     Testosterone, Free, Total, SHBG  Attention deficit disorder (ADD) without hyperactivity -     lisdexamfetamine (VYVANSE ) 30 MG capsule; Take 1 capsule (30 mg total) by mouth daily.  Primary hypertension -     CMP14+EGFR  GAD (generalized anxiety disorder) -     CMP14+EGFR  Screening for lipid disorders -     Lipid panel  Grief    Assessment & Plan Primary hypertension Blood pressure controlled with hydrochlorothiazide . Frequent urination noted as a side effect. No recent potassium monitoring. - Continue hydrochlorothiazide . - Ordered fasting blood work to monitor potassium levels. - Pap and Mammogram UTD  Attention deficit disorder, predominantly inattentive type Currently on Vyvanse  40 mg. Considering dose reduction due to decreased work stress and potential blood pressure impact. - Reduced Vyvanse  dose to 30 mg daily for one month and will reassess in one month.   Generalized anxiety disorder and depression On Trintellix  10 mg daily. Feels generally good but stressed due to family bereavement. Discussed medication impact on libido and mood. - Continue Trintellix  10 mg daily. - Continue klonapin as needed, sparingly.   Grief -  Encouraged her to consider counseling at some point to help with processing.   Perimenopausal symptoms with decreased libido Experiencing significant symptoms including hot flashes and decreased libido. Discussed HRT risks and non-hormonal options. Not interested in HRT. Reports relationship stress. - Ordered hormone level tests. - Discussed non-hormonal options and lifestyle modifications for managing symptoms. - Consider Wellbutrin  for libido if interested.  General Health Maintenance Discussed lifestyle modifications for managing perimenopausal symptoms. - Encouraged regular exercise and sleep management.       Shaguana Love, PA-C

## 2024-02-17 NOTE — Patient Instructions (Signed)

## 2024-02-18 ENCOUNTER — Encounter: Payer: Self-pay | Admitting: Physician Assistant

## 2024-02-18 MED ORDER — HYDROCHLOROTHIAZIDE 25 MG PO TABS: 25.0000 mg | ORAL_TABLET | Freq: Every day | ORAL | 3 refills | Status: AC

## 2024-03-19 ENCOUNTER — Encounter: Payer: Self-pay | Admitting: Physician Assistant

## 2024-03-19 DIAGNOSIS — F988 Other specified behavioral and emotional disorders with onset usually occurring in childhood and adolescence: Secondary | ICD-10-CM

## 2024-03-20 MED ORDER — LISDEXAMFETAMINE DIMESYLATE 30 MG PO CAPS: 30.0000 mg | ORAL_CAPSULE | Freq: Every day | ORAL | 0 refills | Status: AC

## 2024-03-20 NOTE — Telephone Encounter (Signed)
..  PDMP reviewed during this encounter. No concerns.
